# Patient Record
Sex: Female | Born: 1975 | Race: White | Hispanic: No | Marital: Married | Smoking: Never smoker
Health system: Southern US, Community
[De-identification: ages and names within clinical notes are randomized; demographics above are authoritative.]

## PROBLEM LIST (undated history)

## (undated) DIAGNOSIS — J329 Chronic sinusitis, unspecified: Secondary | ICD-10-CM

## (undated) DIAGNOSIS — K861 Other chronic pancreatitis: Secondary | ICD-10-CM

## (undated) DIAGNOSIS — K859 Acute pancreatitis without necrosis or infection, unspecified: Secondary | ICD-10-CM

## (undated) DIAGNOSIS — E785 Hyperlipidemia, unspecified: Secondary | ICD-10-CM

## (undated) DIAGNOSIS — M5030 Other cervical disc degeneration, unspecified cervical region: Secondary | ICD-10-CM

## (undated) DIAGNOSIS — M797 Fibromyalgia: Secondary | ICD-10-CM

## (undated) DIAGNOSIS — K589 Irritable bowel syndrome without diarrhea: Secondary | ICD-10-CM

## (undated) DIAGNOSIS — M5136 Other intervertebral disc degeneration, lumbar region: Secondary | ICD-10-CM

## (undated) DIAGNOSIS — M51369 Other intervertebral disc degeneration, lumbar region without mention of lumbar back pain or lower extremity pain: Secondary | ICD-10-CM

## (undated) DIAGNOSIS — M170 Bilateral primary osteoarthritis of knee: Secondary | ICD-10-CM

## (undated) DIAGNOSIS — M3500 Sicca syndrome, unspecified: Secondary | ICD-10-CM

## (undated) DIAGNOSIS — M6289 Other specified disorders of muscle: Secondary | ICD-10-CM

## (undated) DIAGNOSIS — M719 Bursopathy, unspecified: Secondary | ICD-10-CM

## (undated) DIAGNOSIS — G43909 Migraine, unspecified, not intractable, without status migrainosus: Secondary | ICD-10-CM

## (undated) DIAGNOSIS — G43019 Migraine without aura, intractable, without status migrainosus: Secondary | ICD-10-CM

## (undated) DIAGNOSIS — N94810 Vulvar vestibulitis: Secondary | ICD-10-CM

## (undated) HISTORY — DX: Sjogren syndrome, unspecified: M35.00

## (undated) HISTORY — DX: Chronic sinusitis, unspecified: J32.9

## (undated) HISTORY — DX: Other intervertebral disc degeneration, lumbar region: M51.36

## (undated) HISTORY — DX: Other cervical disc degeneration, unspecified cervical region: M50.30

## (undated) HISTORY — DX: Other intervertebral disc degeneration, lumbar region without mention of lumbar back pain or lower extremity pain: M51.369

## (undated) HISTORY — DX: Bursopathy, unspecified: M71.9

## (undated) HISTORY — DX: Bilateral primary osteoarthritis of knee: M17.0

## (undated) HISTORY — PX: OTHER SURGICAL HISTORY: SHX169

## (undated) HISTORY — DX: Migraine without aura, intractable, without status migrainosus: G43.019

## (undated) HISTORY — DX: Hyperlipidemia, unspecified: E78.5

## (undated) HISTORY — DX: Irritable bowel syndrome, unspecified: K58.9

## (undated) HISTORY — DX: Other specified disorders of muscle: M62.89

## (undated) HISTORY — PX: ERCP: SHX60

## (undated) HISTORY — DX: Acute pancreatitis without necrosis or infection, unspecified: K85.90

## (undated) NOTE — Progress Notes (Signed)
 Formatting of this note might be different from the original. History: Heather Wagner is a 11 year old female  [id number]  Patient's last menstrual period was 02/19/2010. CHIEF COMPLAINT: Patient presents for evaluation of Vestibulitis/Dysparunia  BP 118/73  Pulse 84  Ht 1.626 m (5' 4)  Wt 66.679 kg (147 lb)  LMP 02/19/2010 Exam: BUSV: VESTIBULE TENDER TO Q TIP TOUCH AT 3:00-6:00-9:00 CERVIX: NORMAL  UTERUS: NORMAL SIZE AND SHAPE  ADENEXA: NO PALPABLE MASS NOR TENDERNESS  ASSESSMENT: VESTIBULITIS  PLAN:: VESTIBULE INJECTED WITH 1 ml PLAIN LIDOCAINE AND 1 ml   KENALOG AT THE 3:00-6:00-9:00 O'CLOCK POSITION WITH RESOLUTION OF THE VESTIBULAR PAIN AND TENDERNESS. VESTIBULITIS HAND OUT FOLLOW UP TO SEE ME FOR FURTHER TREATMENT electronically signed by GARNETTE LAMAR CAGE MD 04/06/2010 4:08 PM        Electronically signed by CAGE GARNETTE LAMAR (M.D.) at 04/06/2010  4:08 PM PDT

---

## 1995-12-04 HISTORY — PX: PANCREATICODUODENECTOMY: SUR1000

## 1995-12-04 HISTORY — PX: OTHER SURGICAL HISTORY: SHX169

## 1995-12-04 HISTORY — PX: PANCREAS SURGERY: SHX731

## 2005-12-03 HISTORY — PX: DILATION AND CURETTAGE OF UTERUS: SHX78

## 2008-12-03 HISTORY — PX: BREAST REDUCTION SURGERY: SHX8

## 2010-06-29 ENCOUNTER — Inpatient Hospital Stay (HOSPITAL_COMMUNITY): Admission: EM | Admit: 2010-06-29 | Discharge: 2010-07-02 | Payer: Self-pay | Admitting: Emergency Medicine

## 2010-06-30 ENCOUNTER — Ambulatory Visit: Payer: Self-pay | Admitting: Vascular Surgery

## 2010-07-29 ENCOUNTER — Emergency Department (HOSPITAL_COMMUNITY): Admission: EM | Admit: 2010-07-29 | Discharge: 2010-07-29 | Payer: Self-pay | Admitting: Emergency Medicine

## 2010-08-31 ENCOUNTER — Emergency Department (HOSPITAL_COMMUNITY): Admission: EM | Admit: 2010-08-31 | Discharge: 2010-08-31 | Payer: Self-pay | Admitting: Emergency Medicine

## 2010-09-19 ENCOUNTER — Emergency Department (HOSPITAL_COMMUNITY): Admission: EM | Admit: 2010-09-19 | Discharge: 2010-09-20 | Payer: Self-pay | Admitting: Emergency Medicine

## 2010-11-25 ENCOUNTER — Emergency Department (HOSPITAL_COMMUNITY)
Admission: EM | Admit: 2010-11-25 | Discharge: 2010-11-25 | Payer: Self-pay | Source: Home / Self Care | Admitting: Emergency Medicine

## 2010-11-27 ENCOUNTER — Emergency Department (HOSPITAL_COMMUNITY)
Admission: EM | Admit: 2010-11-27 | Discharge: 2010-11-27 | Payer: Self-pay | Source: Home / Self Care | Admitting: Emergency Medicine

## 2010-12-15 ENCOUNTER — Ambulatory Visit: Admit: 2010-12-15 | Payer: Self-pay | Admitting: Obstetrics & Gynecology

## 2011-02-12 LAB — CBC
HCT: 35.9 % — ABNORMAL LOW (ref 36.0–46.0)
MCHC: 34 g/dL (ref 30.0–36.0)
RBC: 4.16 MIL/uL (ref 3.87–5.11)
RDW: 12 % (ref 11.5–15.5)

## 2011-02-12 LAB — URINALYSIS, ROUTINE W REFLEX MICROSCOPIC
Bilirubin Urine: NEGATIVE
Glucose, UA: NEGATIVE mg/dL
Glucose, UA: NEGATIVE mg/dL
Hgb urine dipstick: NEGATIVE
Ketones, ur: NEGATIVE mg/dL
Nitrite: NEGATIVE
Specific Gravity, Urine: 1.015 (ref 1.005–1.030)
pH: 6 (ref 5.0–8.0)
pH: 6 (ref 5.0–8.0)

## 2011-02-12 LAB — COMPREHENSIVE METABOLIC PANEL
AST: 27 U/L (ref 0–37)
Albumin: 3.6 g/dL (ref 3.5–5.2)
Albumin: 3.8 g/dL (ref 3.5–5.2)
Alkaline Phosphatase: 75 U/L (ref 39–117)
BUN: 8 mg/dL (ref 6–23)
BUN: 9 mg/dL (ref 6–23)
CO2: 26 mEq/L (ref 19–32)
CO2: 26 mEq/L (ref 19–32)
Calcium: 9 mg/dL (ref 8.4–10.5)
Creatinine, Ser: 0.56 mg/dL (ref 0.4–1.2)
GFR calc Af Amer: >60 mL/min (ref >60)
GFR calc Af Amer: >60 mL/min (ref >60)
GFR calc non Af Amer: >60 mL/min (ref >60)
GFR calc non Af Amer: >60 mL/min (ref >60)
Glucose, Bld: 194 mg/dL — ABNORMAL HIGH (ref 70–99)
Potassium: 4 mEq/L (ref 3.5–5.1)
Potassium: 4.2 mEq/L (ref 3.5–5.1)
Sodium: 137 mEq/L (ref 135–145)
Total Bilirubin: 0.5 mg/dL (ref 0.3–1.2)
Total Bilirubin: 0.6 mg/dL (ref 0.3–1.2)
Total Protein: 7.1 g/dL (ref 6.0–8.3)

## 2011-02-12 LAB — DIFFERENTIAL
Basophils Relative: 0 % (ref 0–1)
Monocytes Relative: 6 % (ref 3–12)
Neutrophils Relative %: 75 % (ref 43–77)

## 2011-02-12 LAB — LIPASE, BLOOD: Lipase: 15 U/L (ref 11–59)

## 2011-02-14 LAB — COMPREHENSIVE METABOLIC PANEL
ALT: 35 U/L (ref 0–35)
Alkaline Phosphatase: 86 U/L (ref 39–117)
BUN: 9 mg/dL (ref 6–23)
CO2: 26 mEq/L (ref 19–32)
Calcium: 9.2 mg/dL (ref 8.4–10.5)
Chloride: 104 mEq/L (ref 96–112)
Creatinine, Ser: 0.47 mg/dL (ref 0.4–1.2)
GFR calc non Af Amer: >60 mL/min (ref >60)
Total Bilirubin: 0.9 mg/dL (ref 0.3–1.2)
Total Protein: 7.4 g/dL (ref 6.0–8.3)

## 2011-02-14 LAB — CBC
Hemoglobin: 12.9 g/dL (ref 12.0–15.0)
MCH: 29.4 pg (ref 26.0–34.0)
MCV: 86.5 fL (ref 78.0–100.0)
Platelets: 402 10*3/uL — ABNORMAL HIGH (ref 150–400)

## 2011-02-14 LAB — LIPASE, BLOOD: Lipase: 19 U/L (ref 11–59)

## 2011-02-14 LAB — URINALYSIS, ROUTINE W REFLEX MICROSCOPIC
Hgb urine dipstick: NEGATIVE
Ketones, ur: 15 mg/dL — AB
Leukocytes, UA: NEGATIVE
Protein, ur: NEGATIVE mg/dL
pH: 5.5 (ref 5.0–8.0)

## 2011-02-14 LAB — POCT I-STAT, CHEM 8
Calcium, Ion: 1.16 mmol/L (ref 1.12–1.32)
Glucose, Bld: 181 mg/dL — ABNORMAL HIGH (ref 70–99)
HCT: 41 % (ref 36.0–46.0)
Hemoglobin: 13.9 g/dL (ref 12.0–15.0)
Potassium: 3.8 mEq/L (ref 3.5–5.1)
Sodium: 137 mEq/L (ref 135–145)

## 2011-02-14 LAB — URINE MICROSCOPIC-ADD ON

## 2011-02-14 LAB — DIFFERENTIAL
Eosinophils Relative: 1 % (ref 0–5)
Lymphs Abs: 2.4 10*3/uL (ref 0.7–4.0)
Neutro Abs: 10.2 10*3/uL — ABNORMAL HIGH (ref 1.7–7.7)
Neutrophils Relative %: 76 % (ref 43–77)

## 2011-02-14 LAB — GLUCOSE, CAPILLARY

## 2011-02-15 LAB — URINALYSIS, ROUTINE W REFLEX MICROSCOPIC
Bilirubin Urine: NEGATIVE
Glucose, UA: NEGATIVE mg/dL
Hgb urine dipstick: NEGATIVE
Ketones, ur: >80 mg/dL — AB
Nitrite: NEGATIVE
Protein, ur: NEGATIVE mg/dL
Urobilinogen, UA: 0.2 mg/dL (ref 0.0–1.0)
pH: 5.5 (ref 5.0–8.0)

## 2011-02-15 LAB — GLUCOSE, CAPILLARY: Glucose-Capillary: 216 mg/dL — ABNORMAL HIGH (ref 70–99)

## 2011-02-15 LAB — CBC
HCT: 35.9 % — ABNORMAL LOW (ref 36.0–46.0)
MCH: 29.8 pg (ref 26.0–34.0)
MCHC: 34.1 g/dL (ref 30.0–36.0)
RDW: 12.6 % (ref 11.5–15.5)
WBC: 11.6 10*3/uL — ABNORMAL HIGH (ref 4.0–10.5)

## 2011-02-15 LAB — COMPREHENSIVE METABOLIC PANEL
ALT: 29 U/L (ref 0–35)
AST: 29 U/L (ref 0–37)
CO2: 26 mEq/L (ref 19–32)
Calcium: 8.9 mg/dL (ref 8.4–10.5)
GFR calc Af Amer: >60 mL/min (ref >60)
GFR calc non Af Amer: >60 mL/min (ref >60)
Sodium: 136 mEq/L (ref 135–145)
Total Protein: 7.5 g/dL (ref 6.0–8.3)

## 2011-02-15 LAB — DIFFERENTIAL
Eosinophils Absolute: 0.1 10*3/uL (ref 0.0–0.7)
Eosinophils Relative: 1 % (ref 0–5)
Lymphs Abs: 2 10*3/uL (ref 0.7–4.0)
Monocytes Relative: 5 % (ref 3–12)

## 2011-02-16 ENCOUNTER — Encounter: Payer: Self-pay | Admitting: Family Medicine

## 2011-02-16 LAB — COMPREHENSIVE METABOLIC PANEL
ALT: 39 U/L — ABNORMAL HIGH (ref 0–35)
CO2: 23 mEq/L (ref 19–32)
Calcium: 8.4 mg/dL (ref 8.4–10.5)
Creatinine, Ser: 0.44 mg/dL (ref 0.4–1.2)
GFR calc non Af Amer: >60 mL/min (ref >60)
Glucose, Bld: 176 mg/dL — ABNORMAL HIGH (ref 70–99)

## 2011-02-16 LAB — DIFFERENTIAL
Basophils Absolute: 0 10*3/uL (ref 0.0–0.1)
Eosinophils Absolute: 0.3 10*3/uL (ref 0.0–0.7)
Lymphs Abs: 1.7 10*3/uL (ref 0.7–4.0)
Neutrophils Relative %: 69 % (ref 43–77)

## 2011-02-16 LAB — URINALYSIS, ROUTINE W REFLEX MICROSCOPIC
Bilirubin Urine: NEGATIVE
Glucose, UA: 250 mg/dL — AB
Ketones, ur: 15 mg/dL — AB
pH: 7 (ref 5.0–8.0)

## 2011-02-16 LAB — CBC
HCT: 35.6 % — ABNORMAL LOW (ref 36.0–46.0)
Hemoglobin: 12.2 g/dL (ref 12.0–15.0)
MCH: 30 pg (ref 26.0–34.0)
MCHC: 34.3 g/dL (ref 30.0–36.0)

## 2011-02-16 LAB — LIPASE, BLOOD: Lipase: 17 U/L (ref 11–59)

## 2011-02-16 LAB — POCT PREGNANCY, URINE: Preg Test, Ur: NEGATIVE

## 2011-02-17 LAB — CBC
HCT: 32 % — ABNORMAL LOW (ref 36.0–46.0)
Hemoglobin: 10.7 g/dL — ABNORMAL LOW (ref 12.0–15.0)
Hemoglobin: 11.1 g/dL — ABNORMAL LOW (ref 12.0–15.0)
MCH: 30.2 pg (ref 26.0–34.0)
MCH: 30.4 pg (ref 26.0–34.0)
MCHC: 34.2 g/dL (ref 30.0–36.0)
MCHC: 34.5 g/dL (ref 30.0–36.0)
MCV: 87.7 fL (ref 78.0–100.0)
MCV: 87.9 fL (ref 78.0–100.0)
Platelets: 217 10*3/uL (ref 150–400)
Platelets: 263 10*3/uL (ref 150–400)
RBC: 3.66 MIL/uL — ABNORMAL LOW (ref 3.87–5.11)
RDW: 11.7 % (ref 11.5–15.5)
RDW: 12.1 % (ref 11.5–15.5)
WBC: 13.8 10*3/uL — ABNORMAL HIGH (ref 4.0–10.5)
WBC: 9.3 10*3/uL (ref 4.0–10.5)

## 2011-02-17 LAB — GLUCOSE, CAPILLARY
Glucose-Capillary: 119 mg/dL — ABNORMAL HIGH (ref 70–99)
Glucose-Capillary: 127 mg/dL — ABNORMAL HIGH (ref 70–99)
Glucose-Capillary: 129 mg/dL — ABNORMAL HIGH (ref 70–99)
Glucose-Capillary: 133 mg/dL — ABNORMAL HIGH (ref 70–99)
Glucose-Capillary: 136 mg/dL — ABNORMAL HIGH (ref 70–99)
Glucose-Capillary: 137 mg/dL — ABNORMAL HIGH (ref 70–99)
Glucose-Capillary: 140 mg/dL — ABNORMAL HIGH (ref 70–99)
Glucose-Capillary: 145 mg/dL — ABNORMAL HIGH (ref 70–99)
Glucose-Capillary: 200 mg/dL — ABNORMAL HIGH (ref 70–99)
Glucose-Capillary: 213 mg/dL — ABNORMAL HIGH (ref 70–99)
Glucose-Capillary: 235 mg/dL — ABNORMAL HIGH (ref 70–99)

## 2011-02-17 LAB — URINE CULTURE: Special Requests: NEGATIVE

## 2011-02-17 LAB — COMPREHENSIVE METABOLIC PANEL
ALT: 31 U/L (ref 0–35)
AST: 22 U/L (ref 0–37)
Albumin: 3.5 g/dL (ref 3.5–5.2)
BUN: 13 mg/dL (ref 6–23)
CO2: 22 mEq/L (ref 19–32)
Calcium: 7.8 mg/dL — ABNORMAL LOW (ref 8.4–10.5)
Calcium: 8.8 mg/dL (ref 8.4–10.5)
Chloride: 103 mEq/L (ref 96–112)
Creatinine, Ser: 0.55 mg/dL (ref 0.4–1.2)
Creatinine, Ser: 0.6 mg/dL (ref 0.4–1.2)
GFR calc non Af Amer: >60 mL/min (ref >60)
Glucose, Bld: 143 mg/dL — ABNORMAL HIGH (ref 70–99)
Potassium: 3.8 mEq/L (ref 3.5–5.1)
Sodium: 134 mEq/L — ABNORMAL LOW (ref 135–145)
Total Bilirubin: 0.9 mg/dL (ref 0.3–1.2)
Total Protein: 7.2 g/dL (ref 6.0–8.3)

## 2011-02-17 LAB — PREGNANCY, URINE: Preg Test, Ur: NEGATIVE

## 2011-02-17 LAB — DIFFERENTIAL
Lymphocytes Relative: 8 % — ABNORMAL LOW (ref 12–46)
Lymphs Abs: 1.2 10*3/uL (ref 0.7–4.0)
Monocytes Absolute: 0.9 10*3/uL (ref 0.1–1.0)
Monocytes Relative: 7 % (ref 3–12)
Neutro Abs: 11.6 10*3/uL — ABNORMAL HIGH (ref 1.7–7.7)
Neutrophils Relative %: 84 % — ABNORMAL HIGH (ref 43–77)

## 2011-02-17 LAB — BASIC METABOLIC PANEL
BUN: 6 mg/dL (ref 6–23)
CO2: 25 mEq/L (ref 19–32)
Chloride: 104 mEq/L (ref 96–112)
Glucose, Bld: 118 mg/dL — ABNORMAL HIGH (ref 70–99)
Potassium: 3.9 mEq/L (ref 3.5–5.1)
Sodium: 136 mEq/L (ref 135–145)

## 2011-02-17 LAB — URINALYSIS, ROUTINE W REFLEX MICROSCOPIC
Glucose, UA: NEGATIVE mg/dL
Nitrite: NEGATIVE
pH: 5 (ref 5.0–8.0)

## 2011-02-17 LAB — LIPID PANEL
Cholesterol: 138 mg/dL (ref 0–200)
HDL: 56 mg/dL (ref >39)
LDL Cholesterol: 61 mg/dL (ref 0–99)
Total CHOL/HDL Ratio: 2.5 RATIO

## 2011-02-17 LAB — CULTURE, BLOOD (ROUTINE X 2)

## 2011-02-17 LAB — RAPID URINE DRUG SCREEN, HOSP PERFORMED
Barbiturates: NOT DETECTED
Benzodiazepines: NOT DETECTED
Cocaine: NOT DETECTED

## 2011-02-17 LAB — LIPASE, BLOOD: Lipase: 17 U/L (ref 11–59)

## 2011-02-17 LAB — HEMOGLOBIN A1C: Mean Plasma Glucose: 235 mg/dL — ABNORMAL HIGH (ref <117)

## 2011-03-12 ENCOUNTER — Encounter: Payer: Self-pay | Admitting: Family Medicine

## 2011-05-24 ENCOUNTER — Encounter: Payer: Self-pay | Admitting: Physician Assistant

## 2011-12-31 ENCOUNTER — Other Ambulatory Visit (HOSPITAL_COMMUNITY)
Admission: RE | Admit: 2011-12-31 | Discharge: 2011-12-31 | Disposition: A | Discharge status: Home / Self Care | Payer: BC Managed Care – PPO | Source: Ambulatory Visit | Attending: Obstetrics and Gynecology | Admitting: Obstetrics and Gynecology

## 2011-12-31 DIAGNOSIS — Z01419 Encounter for gynecological examination (general) (routine) without abnormal findings: Secondary | ICD-10-CM | POA: Insufficient documentation

## 2012-03-25 ENCOUNTER — Encounter (HOSPITAL_COMMUNITY): Payer: Self-pay | Admitting: Emergency Medicine

## 2012-03-25 ENCOUNTER — Emergency Department (HOSPITAL_COMMUNITY)
Admission: EM | Admit: 2012-03-25 | Discharge: 2012-03-26 | Disposition: A | Discharge status: Home / Self Care | Payer: BC Managed Care – PPO | Attending: Emergency Medicine | Admitting: Emergency Medicine

## 2012-03-25 DIAGNOSIS — H53149 Visual discomfort, unspecified: Secondary | ICD-10-CM | POA: Insufficient documentation

## 2012-03-25 DIAGNOSIS — R197 Diarrhea, unspecified: Secondary | ICD-10-CM | POA: Insufficient documentation

## 2012-03-25 DIAGNOSIS — Z79899 Other long term (current) drug therapy: Secondary | ICD-10-CM | POA: Insufficient documentation

## 2012-03-25 DIAGNOSIS — E109 Type 1 diabetes mellitus without complications: Secondary | ICD-10-CM | POA: Insufficient documentation

## 2012-03-25 DIAGNOSIS — J45909 Unspecified asthma, uncomplicated: Secondary | ICD-10-CM | POA: Insufficient documentation

## 2012-03-25 DIAGNOSIS — R519 Headache, unspecified: Secondary | ICD-10-CM

## 2012-03-25 DIAGNOSIS — R51 Headache: Secondary | ICD-10-CM | POA: Insufficient documentation

## 2012-03-25 DIAGNOSIS — K861 Other chronic pancreatitis: Secondary | ICD-10-CM | POA: Insufficient documentation

## 2012-03-25 DIAGNOSIS — Z794 Long term (current) use of insulin: Secondary | ICD-10-CM | POA: Insufficient documentation

## 2012-03-25 DIAGNOSIS — IMO0001 Reserved for inherently not codable concepts without codable children: Secondary | ICD-10-CM | POA: Insufficient documentation

## 2012-03-25 DIAGNOSIS — R112 Nausea with vomiting, unspecified: Secondary | ICD-10-CM | POA: Insufficient documentation

## 2012-03-25 HISTORY — DX: Fibromyalgia: M79.7

## 2012-03-25 HISTORY — DX: Other chronic pancreatitis: K86.1

## 2012-03-25 HISTORY — DX: Migraine, unspecified, not intractable, without status migrainosus: G43.909

## 2012-03-25 LAB — GLUCOSE, CAPILLARY

## 2012-03-25 MED ORDER — METHYLPREDNISOLONE SODIUM SUCC 125 MG IJ SOLR
125.0000 mg | Freq: Once | INTRAMUSCULAR | Status: AC
  Administered 2012-03-26: 125 mg via INTRAVENOUS
  Filled 2012-03-25: qty 2

## 2012-03-25 MED ORDER — KETOROLAC TROMETHAMINE 30 MG/ML IJ SOLN
30.0000 mg | Freq: Once | INTRAMUSCULAR | Status: AC
  Administered 2012-03-26: 30 mg via INTRAVENOUS
  Filled 2012-03-25: qty 1

## 2012-03-25 MED ORDER — DROPERIDOL 2.5 MG/ML IJ SOLN
1.2500 mg | Freq: Once | INTRAMUSCULAR | Status: AC
  Administered 2012-03-26: 5 mg via INTRAVENOUS
  Filled 2012-03-25: qty 0.5

## 2012-03-25 MED ORDER — SODIUM CHLORIDE 0.9 % IV SOLN
INTRAVENOUS | Status: DC
  Administered 2012-03-26: via INTRAVENOUS

## 2012-03-25 MED ORDER — SODIUM CHLORIDE 0.9 % IV BOLUS (SEPSIS): 1000.0000 mL | Freq: Once | INTRAVENOUS | Status: DC

## 2012-03-25 MED ORDER — DIPHENHYDRAMINE HCL 50 MG/ML IJ SOLN
25.0000 mg | Freq: Once | INTRAMUSCULAR | Status: AC
  Administered 2012-03-26: 25 mg via INTRAVENOUS
  Filled 2012-03-25: qty 1

## 2012-03-25 NOTE — ED Notes (Signed)
Pt staes she has been having headaches for the past 3 weeks and had vomiting last Thursday  Pt states she saw her dr on April 12th and again today for migraine  Pt states she has had diarrhea and vomiting today  Pt was given 40mg  of steroids in a shot and to start prednisone tomorrow  Pt states she was given demerol 25mg  IM  Pt states she received no relief from the medications and continues to have vomiting and headache

## 2012-03-26 MED ORDER — DIPHENHYDRAMINE HCL 50 MG/ML IJ SOLN
INTRAMUSCULAR | Status: AC
  Administered 2012-03-26: 25 mg via INTRAVASCULAR
  Filled 2012-03-26: qty 1

## 2012-03-26 NOTE — ED Notes (Signed)
Pt began to feel very ancy and jittery she stated and felt like she needed something to calm her down.  Went to Dr. Lynelle Doctor and she ordered 25 of Benadryl which I then gave and pt got better

## 2012-03-26 NOTE — ED Notes (Signed)
Pt feeling better she states

## 2012-03-26 NOTE — ED Provider Notes (Signed)
History     CSN: [id number]  Arrival date & time 03/25/12  2149   First MD Initiated Contact with Patient 03/25/12 2300      Chief Complaint  Patient presents with  . Blood Sugar Problem  . Emesis  . Migraine    (Consider location/radiation/quality/duration/timing/severity/associated sxs/prior treatment) HPI  Patient relates she's been having chronic headaches for the past 3 weeks. She relates she was seen by her family physician in March, April 12 and then again today. Today she got a shot of steroid and they're going to start her on prednisone tomorrow. She states the headache she has today was there when she woke up about noon. She's had nausea with vomiting 3-4 times today and about 6 episodes of watery diarrhea. She states she's had chronic headaches for years that she relates to her menstrual cycle into sinus problems. She states her headaches have been on her left face and including earlier today but now her pain is on the right side. She states the headache is constant without throbbing component. She states it's tight and sharp. Character. She has mild photophobia and some noise sensitivity. She states she had some blurred vision last night but not today. She denies any fever. She states she normally takes Maxalt however she did not take it today. She's also concerned her blood sugar may be high. 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  PCP Dr. Clent Ridges with [facility]  Past Medical History  Diagnosis Date  . Pancreatitis chronic   . Asthma   . Diabetes mellitus   . Fibromyalgia   . Migraines     Past Surgical History  Procedure Date  . Whiple   . Breast reduction surgery   . Dilation and curettage of uterus     Family History  Problem Relation Age of Onset  . Diabetes Other   . Hypertension Other   . Cancer Other     History  Substance Use Topics  . Smoking  status: Never Smoker   . Smokeless tobacco: Not on file  . Alcohol Use: No  on disability for pancreatitis and chronic migraines Lives with husband Retired Chartered loss adjuster  OB History    Grav Para Term Preterm Abortions TAB SAB Ect Mult Living                  Review of Systems  All other systems reviewed and are negative.    Allergies  Nortriptyline hcl; Amitriptyline; Amoxicillin; Compazine; Cymbalta; Doxycycline; Flagyl; Penicillins cross reactors; Topamax; Prozac; and Reglan  Home Medications   Current Outpatient Rx  Name Route Sig Dispense Refill  . ALBUTEROL SULFATE HFA 108 (90 BASE) MCG/ACT IN AERS Inhalation Inhale 2 puffs into the lungs every 6 (six) hours as needed. Asthma    . BECLOMETHASONE DIPROPIONATE 80 MCG/ACT IN AERS Inhalation Inhale 1 puff into the lungs 2 (two) times daily.    Marland Kitchen CALCIUM CARBONATE ANTACID 750 MG PO CHEW Oral Chew 2-4 tablets by mouth as needed. No more than 10 a day    . CARBOXYMETHYLCELLULOSE SODIUM 1 % OP SOLN  1-2 drops every 2 (two) hours as needed. Dry eyes    . VITAMIN D 1000 UNITS PO TABS Oral Take 5,000 Units by mouth 2 (two) times daily.    Marland Kitchen ESTROGENS, CONJUGATED 0.625 MG/GM VA CREA Vaginal Place vaginally at bedtime as needed. Vestibulitis vaginismus    . HYDROCODONE-ACETAMINOPHEN 5-500 MG PO  TABS Oral Take 1 tablet by mouth every 6 (six) hours as needed. Pain    . IBUPROFEN 200 MG PO TABS Oral Take 400 mg by mouth every 6 (six) hours as needed. pain    . INSULIN ASPART 100 UNIT/ML [place] SOLN Subcutaneous Inject into the skin 3 (three) times daily before meals. 1 unit per 10 carb for levels over 150    . INSULIN GLARGINE 100 UNIT/ML [place] SOLN Subcutaneous Inject 13 Units into the skin 2 (two) times daily.    Marland Kitchen KETOTIFEN FUMARATE 0.025 % OP SOLN Both Eyes Place 1 drop into both eyes daily. As symptoms persist    . LIDOCAINE HCL 2 % EX GEL Topical Apply 1 application topically as needed. Outer vestibule and vaginally    . OMEPRAZOLE 20 MG PO  CPDR Oral Take 20 mg by mouth daily.    Marland Kitchen OVER THE COUNTER MEDICATION  See admin instructions. Calcium Magnesium complex 1000mg -500mg  2 tablets with a meal    . ZENPEP PO Oral Take 2 capsules by mouth 3 (three) times daily. [id number]    . PROMETHAZINE HCL 25 MG RE SUPP Rectal Place 25 mg rectally every 6 (six) hours as needed. Nausea when not tolerating by mouth    . PROMETHAZINE HCL 25 MG PO TABS Oral Take 25 mg by mouth every 6 (six) hours as needed. Nausea    . RIZATRIPTAN BENZOATE 10 MG PO TABS Oral Take 10 mg by mouth as needed. May repeat in 2 hours if needed for headaches    . SACCHAROMYCES BOULARDII 250 MG PO CAPS Oral Take 250 mg by mouth 2 (two) times daily between meals.    Marland Kitchen SALINE NASAL SPRAY 0.65 % NA SOLN Nasal Place 1 spray into the nose as needed. Sinus pain    . VITAMIN B-1 100 MG PO TABS Oral Take 100 mg by mouth daily after breakfast.    . VITAMINS A & D EX OINT Topical Apply 1 application topically as needed. Vulvar discomfort and irritation      BP 141/88  Pulse 103  Temp(Src) 98.8 F (37.1 C) (Oral)  Resp 16  SpO2 99%  LMP 03/16/2012  Vital signs normal except tachycardia   Physical Exam  Nursing note and vitals reviewed. Constitutional: She is oriented to person, place, and time. She appears well-developed and well-nourished.  Non-toxic appearance. She does not appear ill. No distress.  HENT:  Head: Normocephalic and atraumatic.  Right Ear: External ear normal.  Left Ear: External ear normal.  Nose: Nose normal. No mucosal edema or rhinorrhea.  Mouth/Throat: Oropharynx is clear and moist and mucous membranes are normal. No dental abscesses or uvula swelling.       Tender over her maxillary sinuses bilaterally. She has no redness or swelling of her face.  Eyes: Conjunctivae and EOM are normal. Pupils are equal, round, and reactive to light.  Neck: Normal range of motion and full passive range of motion without pain. Neck supple.  Cardiovascular:  Normal rate, regular rhythm and normal heart sounds.  Exam reveals no gallop and no friction rub.   No murmur heard. Pulmonary/Chest: Effort normal and breath sounds normal. No respiratory distress. She has no wheezes. She has no rhonchi. She has no rales. She exhibits no tenderness and no crepitus.  Abdominal: Normal appearance.  Musculoskeletal: Normal range of motion. She exhibits no edema and no tenderness.       Moves all extremities well.   Neurological: She is alert and oriented to  person, place, and time. She has normal strength. No cranial nerve deficit.  Skin: Skin is warm, dry and intact. No rash noted. No erythema. No pallor.  Psychiatric: She has a normal mood and affect. Her speech is normal and behavior is normal. Her mood appears not anxious.    ED Course  Procedures (including critical care time)   Medications  sodium chloride 0.9 % bolus 1,000 mL (not administered)  0.9 %  sodium chloride infusion (  Intravenous New Bag/Given 03/26/12 0006)  ketorolac (TORADOL) 30 MG/ML injection 30 mg (30 mg Intravenous Given 03/26/12 0006)  diphenhydrAMINE (BENADRYL) injection 25 mg (25 mg Intravenous Given 03/26/12 0007)  methylPREDNISolone sodium succinate (SOLU-MEDROL) 125 mg/2 mL injection 125 mg (125 mg Intravenous Given 03/26/12 0007)  droperidol (INAPSINE) injection 1.25 mg (5 mg Intravenous Given 03/26/12 0039)  diphenhydrAMINE (BENADRYL) 50 MG/ML injection (25 mg Intravascular Given 03/26/12 0056)    Per [facility] controlled substance site patient gets #120 hydrocodone 5/500 a month from Dr. Clent Ridges, the last was April 9.  Pt had reaction with feeling she was outside of herself after the droperidol that was treated with benadryl.  Recheck 02:00 states her headache is almost gone. Still feels funny after droperidol but is ready to go home.   Labs Reviewed  GLUCOSE, CAPILLARY - Abnormal; Notable for the following:    Glucose-Capillary 159 (*)    All other components within  normal limits   Laboratory interpretation all normal except mild hyperglycemia   Date: 03/26/2012  Rate: 98  Rhythm: normal sinus rhythm  QRS Axis: normal  Intervals: normal  ST/T Wave abnormalities: normal  Conduction Disutrbances:none  Narrative Interpretation:   Old EKG Reviewed: unchanged from 06/30/2010    No results found.   1. Headache, acute     Plan discharge  Devoria Albe, MD, FACEP   MDM          Ward Givens, MD 03/26/12 [phone]

## 2012-03-26 NOTE — Discharge Instructions (Signed)
Go home and rest. Follow up with Dr Clent Ridges about your headaches. Return if you get worse.

## 2012-09-15 ENCOUNTER — Other Ambulatory Visit: Payer: Self-pay | Admitting: Gastroenterology

## 2012-09-15 DIAGNOSIS — K859 Acute pancreatitis without necrosis or infection, unspecified: Secondary | ICD-10-CM

## 2012-09-29 ENCOUNTER — Other Ambulatory Visit: Payer: BC Managed Care – PPO

## 2012-10-06 ENCOUNTER — Ambulatory Visit
Admission: RE | Admit: 2012-10-06 | Discharge: 2012-10-06 | Disposition: A | Discharge status: Home / Self Care | Payer: BC Managed Care – PPO | Source: Ambulatory Visit | Attending: Gastroenterology | Admitting: Gastroenterology

## 2012-10-06 DIAGNOSIS — K859 Acute pancreatitis without necrosis or infection, unspecified: Secondary | ICD-10-CM

## 2012-10-06 MED ORDER — IOHEXOL 300 MG/ML  SOLN: 100.0000 mL | Freq: Once | INTRAMUSCULAR | Status: AC | PRN

## 2013-02-24 ENCOUNTER — Other Ambulatory Visit (HOSPITAL_COMMUNITY)
Admission: RE | Admit: 2013-02-24 | Discharge: 2013-02-24 | Disposition: A | Discharge status: Home / Self Care | Payer: BC Managed Care – PPO | Source: Ambulatory Visit | Attending: Obstetrics and Gynecology | Admitting: Obstetrics and Gynecology

## 2013-02-24 ENCOUNTER — Other Ambulatory Visit: Payer: Self-pay | Admitting: Obstetrics and Gynecology

## 2013-02-24 DIAGNOSIS — Z01419 Encounter for gynecological examination (general) (routine) without abnormal findings: Secondary | ICD-10-CM | POA: Insufficient documentation

## 2013-02-24 DIAGNOSIS — Z1151 Encounter for screening for human papillomavirus (HPV): Secondary | ICD-10-CM | POA: Insufficient documentation

## 2013-09-25 ENCOUNTER — Emergency Department (HOSPITAL_COMMUNITY): Payer: BC Managed Care – PPO

## 2013-09-25 ENCOUNTER — Encounter (HOSPITAL_COMMUNITY): Payer: Self-pay | Admitting: Emergency Medicine

## 2013-09-25 ENCOUNTER — Inpatient Hospital Stay (HOSPITAL_COMMUNITY)
Admission: EM | Admit: 2013-09-25 | Discharge: 2013-09-27 | DRG: 439 | Disposition: A | Discharge status: Home / Self Care | Payer: BC Managed Care – PPO | Attending: Internal Medicine | Admitting: Internal Medicine

## 2013-09-25 DIAGNOSIS — K861 Other chronic pancreatitis: Secondary | ICD-10-CM | POA: Diagnosis present

## 2013-09-25 DIAGNOSIS — D638 Anemia in other chronic diseases classified elsewhere: Secondary | ICD-10-CM | POA: Diagnosis present

## 2013-09-25 DIAGNOSIS — K859 Acute pancreatitis without necrosis or infection, unspecified: Principal | ICD-10-CM | POA: Diagnosis present

## 2013-09-25 DIAGNOSIS — D649 Anemia, unspecified: Secondary | ICD-10-CM

## 2013-09-25 DIAGNOSIS — Z90411 Acquired partial absence of pancreas: Secondary | ICD-10-CM

## 2013-09-25 DIAGNOSIS — D72829 Elevated white blood cell count, unspecified: Secondary | ICD-10-CM

## 2013-09-25 DIAGNOSIS — E119 Type 2 diabetes mellitus without complications: Secondary | ICD-10-CM

## 2013-09-25 DIAGNOSIS — Z23 Encounter for immunization: Secondary | ICD-10-CM

## 2013-09-25 DIAGNOSIS — G43909 Migraine, unspecified, not intractable, without status migrainosus: Secondary | ICD-10-CM | POA: Diagnosis not present

## 2013-09-25 DIAGNOSIS — J45909 Unspecified asthma, uncomplicated: Secondary | ICD-10-CM

## 2013-09-25 DIAGNOSIS — E871 Hypo-osmolality and hyponatremia: Secondary | ICD-10-CM

## 2013-09-25 DIAGNOSIS — D509 Iron deficiency anemia, unspecified: Secondary | ICD-10-CM

## 2013-09-25 DIAGNOSIS — E236 Other disorders of pituitary gland: Secondary | ICD-10-CM | POA: Diagnosis present

## 2013-09-25 LAB — URINALYSIS, ROUTINE W REFLEX MICROSCOPIC
Bilirubin Urine: NEGATIVE
Hgb urine dipstick: NEGATIVE
Ketones, ur: NEGATIVE mg/dL
Nitrite: NEGATIVE
Specific Gravity, Urine: 1.04 — ABNORMAL HIGH (ref 1.005–1.030)
Urobilinogen, UA: 0.2 mg/dL (ref 0.0–1.0)
pH: 6.5 (ref 5.0–8.0)

## 2013-09-25 LAB — CBC WITH DIFFERENTIAL/PLATELET
Basophils Absolute: 0 10*3/uL (ref 0.0–0.1)
Basophils Relative: 0 % (ref 0–1)
Basophils Relative: 0 % (ref 0–1)
Eosinophils Absolute: 0 10*3/uL (ref 0.0–0.7)
Eosinophils Relative: 1 % (ref 0–5)
Eosinophils Relative: 0 % (ref 0–5)
HCT: 35 % — ABNORMAL LOW (ref 36.0–46.0)
Hemoglobin: 11.4 g/dL — ABNORMAL LOW (ref 12.0–15.0)
Lymphocytes Relative: 11 % — ABNORMAL LOW (ref 12–46)
Lymphocytes Relative: 8 % — ABNORMAL LOW (ref 12–46)
MCHC: 32.7 g/dL (ref 30.0–36.0)
MCV: 79 fL (ref 78.0–100.0)
Monocytes Relative: 8 % (ref 3–12)
Neutro Abs: 21.1 10*3/uL — ABNORMAL HIGH (ref 1.7–7.7)
Neutrophils Relative %: 80 % — ABNORMAL HIGH (ref 43–77)
Platelets: 356 10*3/uL (ref 150–400)
RBC: 4.43 MIL/uL (ref 3.87–5.11)
RDW: 15 % (ref 11.5–15.5)
WBC: 26.4 10*3/uL — ABNORMAL HIGH (ref 4.0–10.5)
WBC: 22 10*3/uL — ABNORMAL HIGH (ref 4.0–10.5)

## 2013-09-25 LAB — COMPREHENSIVE METABOLIC PANEL
ALT: 20 U/L (ref 0–35)
Alkaline Phosphatase: 89 U/L (ref 39–117)
BUN: 13 mg/dL (ref 6–23)
Chloride: 96 mEq/L (ref 96–112)
GFR calc Af Amer: >90 mL/min (ref >90)
Glucose, Bld: 165 mg/dL — ABNORMAL HIGH (ref 70–99)
Potassium: 3.6 mEq/L (ref 3.5–5.1)
Sodium: 132 mEq/L — ABNORMAL LOW (ref 135–145)
Total Bilirubin: 0.4 mg/dL (ref 0.3–1.2)
Total Protein: 7.8 g/dL (ref 6.0–8.3)

## 2013-09-25 LAB — HEPATIC FUNCTION PANEL
ALT: 22 U/L (ref 0–35)
Albumin: 3.1 g/dL — ABNORMAL LOW (ref 3.5–5.2)
Alkaline Phosphatase: 79 U/L (ref 39–117)
Bilirubin, Direct: 0.1 mg/dL (ref 0.0–0.3)
Indirect Bilirubin: 0.5 mg/dL (ref 0.3–0.9)

## 2013-09-25 LAB — LIPID PANEL
Cholesterol: 157 mg/dL (ref 0–200)
HDL: 60 mg/dL (ref >39)
LDL Cholesterol: 84 mg/dL (ref 0–99)
Total CHOL/HDL Ratio: 2.6 RATIO

## 2013-09-25 LAB — BASIC METABOLIC PANEL
BUN: 9 mg/dL (ref 6–23)
CO2: 21 mEq/L (ref 19–32)
Calcium: 8.7 mg/dL (ref 8.4–10.5)
Chloride: 97 mEq/L (ref 96–112)
Creatinine, Ser: 0.51 mg/dL (ref 0.50–1.10)
GFR calc Af Amer: >90 mL/min (ref >90)

## 2013-09-25 LAB — LIPASE, BLOOD: Lipase: 67 U/L — ABNORMAL HIGH (ref 11–59)

## 2013-09-25 LAB — LACTATE DEHYDROGENASE: LDH: 164 U/L (ref 94–250)

## 2013-09-25 LAB — GLUCOSE, CAPILLARY: Glucose-Capillary: 114 mg/dL — ABNORMAL HIGH (ref 70–99)

## 2013-09-25 LAB — C-REACTIVE PROTEIN: CRP: 2.1 mg/dL — ABNORMAL HIGH (ref <0.60)

## 2013-09-25 MED ORDER — SODIUM CHLORIDE 0.9 % IV SOLN: INTRAVENOUS | Status: DC

## 2013-09-25 MED ORDER — INSULIN ASPART 100 UNIT/ML ~~LOC~~ SOLN
0.0000 [IU] | Freq: Three times a day (TID) | SUBCUTANEOUS | Status: DC
  Administered 2013-09-25 – 2013-09-26 (×3): 2 [IU] via SUBCUTANEOUS
  Administered 2013-09-27: 1 [IU] via SUBCUTANEOUS

## 2013-09-25 MED ORDER — IOHEXOL 300 MG/ML  SOLN
100.0000 mL | Freq: Once | INTRAMUSCULAR | Status: AC | PRN
  Administered 2013-09-25: 100 mL via INTRAVENOUS

## 2013-09-25 MED ORDER — SODIUM CHLORIDE 0.9 % IV SOLN
500.0000 mg | Freq: Four times a day (QID) | INTRAVENOUS | Status: DC
  Administered 2013-09-25 (×2): 500 mg via INTRAVENOUS
  Filled 2013-09-25 (×6): qty 500

## 2013-09-25 MED ORDER — ONDANSETRON HCL 4 MG/2ML IJ SOLN
4.0000 mg | Freq: Four times a day (QID) | INTRAMUSCULAR | Status: DC | PRN
  Administered 2013-09-25 (×2): 4 mg via INTRAVENOUS
  Filled 2013-09-25 (×2): qty 2

## 2013-09-25 MED ORDER — ACETAMINOPHEN 650 MG RE SUPP: 650.0000 mg | Freq: Four times a day (QID) | RECTAL | Status: DC | PRN

## 2013-09-25 MED ORDER — SODIUM CHLORIDE 0.9 % IV SOLN
Freq: Once | INTRAVENOUS | Status: AC
  Administered 2013-09-25: 03:00:00 via INTRAVENOUS

## 2013-09-25 MED ORDER — HYDROMORPHONE HCL PF 1 MG/ML IJ SOLN
1.0000 mg | INTRAMUSCULAR | Status: DC | PRN
  Administered 2013-09-25 – 2013-09-26 (×4): 1 mg via INTRAVENOUS
  Administered 2013-09-26: 2 mg via INTRAVENOUS
  Administered 2013-09-26 (×2): 1 mg via INTRAVENOUS
  Administered 2013-09-26 (×2): 2 mg via INTRAVENOUS
  Administered 2013-09-27: 1 mg via INTRAVENOUS
  Filled 2013-09-25: qty 1
  Filled 2013-09-25: qty 2
  Filled 2013-09-25 (×2): qty 1
  Filled 2013-09-25 (×2): qty 2
  Filled 2013-09-25 (×2): qty 1
  Filled 2013-09-25 (×2): qty 2

## 2013-09-25 MED ORDER — SODIUM CHLORIDE 0.9 % IV SOLN
25.0000 mg | Freq: Four times a day (QID) | INTRAVENOUS | Status: DC | PRN
  Filled 2013-09-25: qty 1

## 2013-09-25 MED ORDER — ONDANSETRON HCL 4 MG PO TABS: 4.0000 mg | ORAL_TABLET | Freq: Four times a day (QID) | ORAL | Status: DC | PRN

## 2013-09-25 MED ORDER — PANTOPRAZOLE SODIUM 40 MG PO TBEC
40.0000 mg | DELAYED_RELEASE_TABLET | Freq: Every day | ORAL | Status: DC
  Administered 2013-09-25 – 2013-09-27 (×3): 40 mg via ORAL
  Filled 2013-09-25 (×3): qty 1

## 2013-09-25 MED ORDER — SODIUM CHLORIDE 0.9 % IV SOLN
Freq: Once | INTRAVENOUS | Status: AC
  Administered 2013-09-25: 06:00:00 via INTRAVENOUS

## 2013-09-25 MED ORDER — MOMETASONE FURO-FORMOTEROL FUM 100-5 MCG/ACT IN AERO
2.0000 | INHALATION_SPRAY | Freq: Two times a day (BID) | RESPIRATORY_TRACT | Status: DC
  Administered 2013-09-25 – 2013-09-27 (×4): 2 via RESPIRATORY_TRACT
  Filled 2013-09-25: qty 8.8

## 2013-09-25 MED ORDER — ACETAMINOPHEN 325 MG PO TABS
650.0000 mg | ORAL_TABLET | Freq: Four times a day (QID) | ORAL | Status: DC | PRN
  Administered 2013-09-26 – 2013-09-27 (×2): 650 mg via ORAL
  Filled 2013-09-25 (×2): qty 2

## 2013-09-25 MED ORDER — SODIUM CHLORIDE 0.9 % IV BOLUS (SEPSIS)
1000.0000 mL | Freq: Once | INTRAVENOUS | Status: AC
  Administered 2013-09-25: 1000 mL via INTRAVENOUS

## 2013-09-25 MED ORDER — ONDANSETRON HCL 4 MG/2ML IJ SOLN: 4.0000 mg | Freq: Three times a day (TID) | INTRAMUSCULAR | Status: DC | PRN

## 2013-09-25 MED ORDER — HYDROMORPHONE HCL PF 1 MG/ML IJ SOLN
1.0000 mg | INTRAMUSCULAR | Status: DC | PRN
  Administered 2013-09-25 (×3): 1 mg via INTRAVENOUS
  Filled 2013-09-25 (×3): qty 1

## 2013-09-25 MED ORDER — INFLUENZA VAC SPLIT QUAD 0.5 ML IM SUSP
0.5000 mL | INTRAMUSCULAR | Status: DC
Start: 2013-09-26 — End: 2013-09-27
  Filled 2013-09-25 (×2): qty 0.5

## 2013-09-25 MED ORDER — INSULIN GLARGINE 100 UNIT/ML ~~LOC~~ SOLN
5.0000 [IU] | Freq: Two times a day (BID) | SUBCUTANEOUS | Status: DC
  Administered 2013-09-25 (×2): 5 [IU] via SUBCUTANEOUS
  Filled 2013-09-25 (×3): qty 0.05

## 2013-09-25 MED ORDER — HYDROMORPHONE HCL PF 1 MG/ML IJ SOLN: 1.0000 mg | INTRAMUSCULAR | Status: DC | PRN

## 2013-09-25 MED ORDER — PROMETHAZINE HCL 25 MG/ML IJ SOLN
12.5000 mg | Freq: Four times a day (QID) | INTRAMUSCULAR | Status: DC | PRN
  Administered 2013-09-25 – 2013-09-27 (×6): 12.5 mg via INTRAVENOUS
  Filled 2013-09-25 (×6): qty 1

## 2013-09-25 MED ORDER — HYDROMORPHONE HCL PF 1 MG/ML IJ SOLN
1.0000 mg | INTRAMUSCULAR | Status: DC | PRN
  Administered 2013-09-25 (×2): 1 mg via INTRAVENOUS
  Filled 2013-09-25 (×2): qty 1

## 2013-09-25 MED ORDER — SODIUM CHLORIDE 0.9 % IV SOLN
INTRAVENOUS | Status: AC
  Administered 2013-09-25 – 2013-09-26 (×4): via INTRAVENOUS

## 2013-09-25 MED ORDER — CARBOXYMETHYLCELLULOSE SODIUM 1 % OP SOLN: 1.0000 [drp] | OPHTHALMIC | Status: DC | PRN

## 2013-09-25 MED ORDER — ALBUTEROL SULFATE HFA 108 (90 BASE) MCG/ACT IN AERS
2.0000 | INHALATION_SPRAY | Freq: Four times a day (QID) | RESPIRATORY_TRACT | Status: DC | PRN
  Filled 2013-09-25: qty 6.7

## 2013-09-25 MED ORDER — HYDROMORPHONE HCL PF 1 MG/ML IJ SOLN
1.0000 mg | Freq: Once | INTRAMUSCULAR | Status: AC
  Administered 2013-09-25: 1 mg via INTRAVENOUS
  Filled 2013-09-25: qty 1

## 2013-09-25 MED ORDER — BECLOMETHASONE DIPROPIONATE 80 MCG/ACT IN AERS: 1.0000 | INHALATION_SPRAY | RESPIRATORY_TRACT | Status: DC | PRN

## 2013-09-25 MED ORDER — IOHEXOL 300 MG/ML  SOLN
50.0000 mL | Freq: Once | INTRAMUSCULAR | Status: AC | PRN
  Administered 2013-09-25: 50 mL via ORAL

## 2013-09-25 MED ORDER — ALBUTEROL SULFATE HFA 108 (90 BASE) MCG/ACT IN AERS
2.0000 | INHALATION_SPRAY | RESPIRATORY_TRACT | Status: DC | PRN
  Administered 2013-09-25 – 2013-09-26 (×2): 2 via RESPIRATORY_TRACT
  Filled 2013-09-25: qty 6.7

## 2013-09-25 MED ORDER — POLYVINYL ALCOHOL 1.4 % OP SOLN
1.0000 [drp] | OPHTHALMIC | Status: DC | PRN
  Filled 2013-09-25: qty 15

## 2013-09-25 MED ORDER — MOMETASONE FURO-FORMOTEROL FUM 100-5 MCG/ACT IN AERO
2.0000 | INHALATION_SPRAY | Freq: Two times a day (BID) | RESPIRATORY_TRACT | Status: DC
Start: 2013-09-25 — End: 2013-09-25
  Filled 2013-09-25: qty 8.8

## 2013-09-25 MED ORDER — PROMETHAZINE HCL 25 MG/ML IJ SOLN
12.5000 mg | Freq: Once | INTRAMUSCULAR | Status: AC
  Administered 2013-09-25: 12.5 mg via INTRAVENOUS
  Filled 2013-09-25: qty 1

## 2013-09-25 NOTE — Progress Notes (Signed)
Pt rec'd from the ed...Marland KitchenMarland KitchenCondition stable..placed in bed. Pt without c/o of this time.

## 2013-09-25 NOTE — ED Provider Notes (Signed)
Medical screening examination/treatment/procedure(s) were performed by non-physician practitioner and as supervising physician I was immediately available for consultation/collaboration.  Derwood Kaplan, MD 09/25/13 [phone]

## 2013-09-25 NOTE — Progress Notes (Signed)
ANTIBIOTIC CONSULT NOTE - INITIAL  Pharmacy Consult for Primaxin Indication: Pancreatitis   Allergies  Allergen Reactions  . Nortriptyline Hcl Shortness Of Breath    Swelling anxiety  . Amitriptyline Other (See Comments)    sedation  . Amoxicillin Nausea And Vomiting  . Compazine [Prochlorperazine Maleate] Other (See Comments)    Neurological - uncontrollable eye movements  . Cymbalta [Duloxetine Hcl] Nausea And Vomiting    insomnia  . Doxycycline Nausea And Vomiting  . Flagyl [Metronidazole Hcl] Nausea And Vomiting    Skin rash  . Penicillins Cross Reactors Nausea And Vomiting    Skin rash/hives  . Topamax [Topiramate] Other (See Comments)    Mood change, anxiety, headaches  . Prozac [Fluoxetine Hcl] Anxiety  . Reglan [Metoclopramide Hcl] Anxiety    Patient Measurements: Height: 5\' 4"  (162.6 cm) Weight: 159 lb 9.6 oz (72.394 kg) IBW/kg (Calculated) : 54.7  Vital Signs: Temp: 99.2 F (37.3 C) (10/24 0649) Temp src: Oral (10/24 0649) BP: 126/72 mmHg (10/24 0649) Pulse Rate: 98 (10/24 0649) Intake/Output from previous day:   Intake/Output from this shift:    Labs:  Recent Labs  09/25/13 0035  WBC 26.4*  HGB 11.4*  PLT 413*  CREATININE 0.53   Estimated Creatinine Clearance: 93.9 ml/min (by C-G formula based on Cr of 0.53). No results found for this basename: VANCOTROUGH, VANCOPEAK, VANCORANDOM, GENTTROUGH, GENTPEAK, GENTRANDOM, TOBRATROUGH, TOBRAPEAK, TOBRARND, AMIKACINPEAK, AMIKACINTROU, AMIKACIN,  in the last 72 hours   Microbiology: No results found for this or any previous visit (from the past 720 hour(s)).  Medical History: Past Medical History  Diagnosis Date  . Pancreatitis chronic   . Asthma   . Diabetes mellitus   . Fibromyalgia   . Migraines    Assessment: 65 yoF with PMHx for chronic hereditary pancreatitis s/p whipple procedure, DM, fibromyalgia, and asthma presents with severe abdominal pains, nausea, vomiting, leukoctyosis, and elevated  lipase.  CT consistent with acute pancreatitis and pharmacy consulted to dose primaxin.  No history of seizures documented.  Noted allergy to amoxicillin and penicillins with N/V, skin rash, hives.  Discussed with MD, plan to monitor closely for allergic reaction.    10/24 >> Primaxin >>  Weight = 72.4kg Tm24h: 99.2 WBC: Elevated, 26.4 Renal: SCr = 0.53, CrCL 94 (CG), >100 (N)  Microbiology:  10/24 Blood cxs ordered  Goal of Therapy:  Eradication of infection  Plan:  Primaxin 500mg  IV q 6 hours.  F/u renal function, cultures, clinical course.   Koa Zoeller E 09/25/2013,7:09 AM

## 2013-09-25 NOTE — ED Notes (Signed)
Pt reports a flare up of her pancreasitis that started early afternoon. Pt reports nausea, no vomiting. 2 vicodin taken at 8pm with no relief.

## 2013-09-25 NOTE — ED Provider Notes (Signed)
Heather Wagner S 1:00 AM patient discussed signout.  Patient with prior history of recurrent pancreatitis, Whipple procedure presenting with similar severe sharp abdominal pains with nausea vomiting to previous pancreatitis. Initial CBC shows significantly elevated WBC. Patient is very tender in the abdomen. Slightly tachycardic. Afebrile. Additional lab testing and CT scan pending.  Patient with elevated lipase and inflammation around the pancreas consistent with acute pancreatitis. Patient did have some improvements with initial medications the pain continues to return significantly. She has not had any additional nausea or vomiting. At this time discussed options for continued syndromatic treatment inpatient and patient does wish to have this.  I spoke with [facility]. They will see patient and admit to regular floor under team 8.  Angus Seller, PA-C 09/25/13 [phone]

## 2013-09-25 NOTE — Progress Notes (Addendum)
[facility] PROGRESS NOTE  Heather Wagner ZOX:[id number] DOB: 05-Sep-1976 DOA: 09/25/2013 PCP: No primary provider on file.  Assessment/Plan  Acute on chronic pancreatitis with history of Whipple's procedure - patient has history of hereditary pancreatitis.  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  Clearance Coots of water  -  Continue with IV fluids -  Add phenergan 12.5mg  IV q6h prn.  If increased dose needs to be given, need to administer as infusion over 25 minutes. -  Increase dilaudid -  Continue primaxin and monitor for allergy -  TG, LDH, LFTs wnl  Diabetes mellitus  -  Continue lantus 5 units q12h -  Continue low dose SSI  Leukocytosis - probably reactionary to pancreatitis and trending down.  -  F/u blood cultures  History of bronchial asthma - presently not wheezing. Continue inhalers.  History of migraines - denies any headache at this time.  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    Objective: Filed Vitals:   09/25/13 0558 09/25/13 0649 09/25/13 1400 09/25/13 1429  BP: 123/74 126/72 131/88   Pulse: 110 98 116   Temp: 99.1 F (37.3 C) 99.2 F (37.3 C) 98.7 F (37.1 C)   TempSrc: Oral Oral Oral   Resp: 18 18 16    Height:  5\' 4"  (1.626 m)    Weight:  72.394 kg (159 lb 9.6 oz)    SpO2: 96% 100% 98% 95%    Intake/Output Summary (Last 24 hours) at 09/25/13  1734 Last data filed at 09/25/13 1552  Gross per 24 hour  Intake      0 ml  Output      2 ml  Net     -2 ml   Filed Weights   09/25/13 0649  Weight: 72.394 kg (159 lb 9.6 oz)    Exam:   General:  CF, No acute distress  HEENT:  NCAT, MMM  Cardiovascular:  RRR, nl S1, S2 no mrg, 2+ pulses, warm extremities  Respiratory:  CTAB, no increased WOB  Abdomen:   Hypoactive BS, soft, ND, mildly TTP diffusely without rebound or guarding  MSK:   Normal tone and bulk, no LEE  Neuro:  Grossly intact  Data Reviewed: Basic Metabolic Panel:  Recent Labs Lab 09/25/13 0035 09/25/13 0814  NA 132* 131*  K 3.6 3.9  CL 96 97  CO2 25 21  GLUCOSE 165* 190*  BUN 13 9  CREATININE 0.53 0.51  CALCIUM 9.5 8.7   Liver Function Tests:  Recent Labs Lab 09/25/13 0035 09/25/13 0814  AST 18 25  ALT 20 22  ALKPHOS 89 79  BILITOT 0.4 0.6  PROT 7.8 6.9  ALBUMIN 3.6 3.1*    Recent Labs Lab 09/25/13 0035 09/25/13 0814  LIPASE 67* 20   No results found for this basename: AMMONIA,  in the last 168 hours CBC:  Recent Labs Lab 09/25/13 0035 09/25/13 8119  WBC 26.4* 22.0*  NEUTROABS 21.1* 18.6*  HGB 11.4* 10.1*  HCT 35.0* 30.9*  MCV 79.0 79.0  PLT 413* 356   Cardiac Enzymes: No results found for this basename: CKTOTAL, CKMB, CKMBINDEX, TROPONINI,  in the last 168 hours BNP (last 3 results) No results found for this basename: PROBNP,  in the last 8760 hours CBG:  Recent Labs Lab 09/25/13 0842 09/25/13 1207 09/25/13 1603  GLUCAP 156* 114* 154*    No results found for this or any previous visit (from the past 240 hour(s)).   Studies: Ct Abdomen Pelvis W Contrast  09/25/2013   CLINICAL DATA:  Abdominal pain and nausea. White cell count 26.4. Feels like a pancreatitis flare up. History of pancreatitis and Whipple procedure.  EXAM: CT ABDOMEN AND PELVIS WITH CONTRAST  TECHNIQUE: Multidetector CT imaging of the abdomen and pelvis was performed using the standard protocol  following bolus administration of intravenous contrast.  CONTRAST:  OMNIPAQUE IOHEXOL 300 MG/ML  SOLN  COMPARISON:  10/06/2012  FINDINGS: Fibrosis in the lung bases. Tiny subpleural nodule in the right anterior of chest is stable.  Mild diffuse fatty infiltration of the liver. Surgical absence of the gallbladder. 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 There is evidence of the infiltration and edema around the body and tail of the pancreas with mild pancreatic ductal dilatation. This is consistent with pancreatitis. No pancreatic parenchymal necrosis. No pseudocysts. Gas in the bowel ducts without distention. This is likely postoperative. The spleen, adrenal glands, kidneys, abdominal aorta, inferior vena cava, and retroperitoneal lymph nodes are unremarkable. The stomach is normal. Small bowel are normal for degree of distention. Stool-filled colon without distention. No free air in the abdomen. Abdominal wall musculature appears intact.  Pelvis: Probable fibroid in the right side of the uterus measuring about 3.4 cm diameter. A simple appearing cyst in the right ovary measuring 3.3 cm diameter. No abnormal left adnexal masses. Bladder wall is not thickened. Stool-filled rectosigmoid colon without diverticulitis. The appendix is normal. No free or loculated pelvic fluid collections. Normal alignment of the lumbar vertebra.  IMPRESSION: Partial pancreatectomy with pancreatic ductal dilatation and peripancreatic edema consistent with acute pancreatitis. Diffuse fatty infiltration of the liver. Biliary gas is likely postoperative. Uterine fibroids. Right ovarian cyst, likely functional.   Electronically Signed   By: Burman Nieves M.D.   On: 09/25/2013 03:09    Scheduled Meds: . imipenem-cilastatin  500 mg Intravenous Q6H  . [START ON 09/26/2013] influenza vac split quadrivalent PF  0.5 mL Intramuscular Tomorrow-1000  . insulin aspart  0-9 Units Subcutaneous  TID WC  . insulin glargine  5 Units Subcutaneous BID  . mometasone-formoterol  2 puff Inhalation BID  . pantoprazole  40 mg Oral Daily   Continuous Infusions: . sodium chloride 125 mL/hr at 09/25/13 1438    Principal Problem:   Acute pancreatitis Active Problems:   Leucocytosis   Anemia   Asthma   Diabetes mellitus    Time spent: 30 min    Jasman Murri, [facility]  [facility] Pager [phone]. If 7PM-7AM, please contact night-coverage at [email or web address], password [facility] 09/25/2013, 5:34 PM  LOS: 0 days

## 2013-09-25 NOTE — H&P (Addendum)
[facility] History and Physical  [name] RUE:[id number] DOB: 06-04-1976 DOA: 09/25/2013  Referring physician: ER physician. PCP: No primary provider on file. [facility]. Specialists: Dr. Randa Evens. Gastroenterologist.   Chief Complaint:  Abdominal pain.   HPI: Heather Wagner is a 37 y.o. female with known history of hereditary pancreatitis presented to the ER because of severe abdominal pain in the epigastric area. Patient's pain started last evening which acutely worsened. It was tarry in nature radiating to the back and was associated with nausea and vomiting. Denies any diarrhea. In the ER patient had lab works done which showed significant leukocytosis and mildly elevated lipase. CT abdomen and pelvis shows features consistent with acute pancreatitis. Patient at this time has been admitted for further management. Patient presently is not hypoxic and not in acute distress. Ranson criteria for '0' hours (LDH not done) was 1 out of 4 for leukocytosis. Patient states that of recently patient has been getting more frequent pancreatitis flareups. Patient has been following up with her gastroenterologist and has been to him 2 weeks ago after a recent flareup. Patient otherwise denies any chest pain or shortness of breath diarrhea fever chills.  Review of Systems: As presented in the history of presenting illness, rest negative.  Past Medical History  Diagnosis Date  . Pancreatitis chronic   . Asthma   . Diabetes mellitus   . Fibromyalgia   . Migraines    Past Surgical History  Procedure Laterality Date  . Whiple    . Breast reduction surgery    . Dilation and curettage of uterus    . Whipple's     Social History:  reports that she has never smoked. She does not have any smokeless tobacco history on file. She reports that she does not drink alcohol or use illicit drugs. Where does patient live home. Can patient participate in ADLs? Yes.  Allergies   Allergen Reactions  . Nortriptyline Hcl Shortness Of Breath    Swelling anxiety  . Amitriptyline Other (See Comments)    sedation  . Amoxicillin Nausea And Vomiting  . Compazine [Prochlorperazine Maleate] Other (See Comments)    Neurological - uncontrollable eye movements  . Cymbalta [Duloxetine Hcl] Nausea And Vomiting    insomnia  . Doxycycline Nausea And Vomiting  . Flagyl [Metronidazole Hcl] Nausea And Vomiting    Skin rash  . Penicillins Cross Reactors Nausea And Vomiting    Skin rash/hives  . Topamax [Topiramate] Other (See Comments)    Mood change, anxiety, headaches  . Prozac [Fluoxetine Hcl] Anxiety  . Reglan [Metoclopramide Hcl] Anxiety    Family History:  Family History  Problem Relation Age of Onset  . Diabetes Other   . Hypertension Other   . Cancer Other   . Pancreatitis Mother   . Pancreatitis Maternal Uncle       Prior to Admission medications   Medication Sig Start Date End Date Taking? Authorizing Provider  albuterol (PROVENTIL HFA;VENTOLIN HFA) 108 (90 BASE) MCG/ACT inhaler Inhale 2 puffs into the lungs every 6 (six) hours as needed. Asthma   Yes Historical Provider, MD  calcium carbonate (TUMS EX) 750 MG chewable tablet Chew 2-4 tablets by mouth as needed. No more than 10 a day   Yes Historical Provider, MD  carboxymethylcellulose 1 % ophthalmic solution 1-2 drops every 2 (two) hours as needed. Dry eyes   Yes Historical Provider, MD  HYDROcodone-acetaminophen (VICODIN) 5-500 MG per tablet Take 1 tablet by mouth every 6 (six) hours as needed.  Pain   Yes Historical Provider, MD  ibuprofen (ADVIL,MOTRIN) 200 MG tablet Take 400 mg by mouth every 6 (six) hours as needed. pain   Yes Historical Provider, MD  insulin aspart (NOVOLOG) 100 UNIT/ML injection Inject into the skin 3 (three) times daily before meals. 1 unit per 10 carb for levels over 150   Yes Historical Provider, MD  insulin glargine (LANTUS) 100 UNIT/ML injection Inject 17 Units into the skin 2 (two)  times daily.    Yes Historical Provider, MD  mometasone-formoterol (DULERA) 100-5 MCG/ACT AERO Inhale 2 puffs into the lungs 2 (two) times daily.   Yes Historical Provider, MD  omeprazole (PRILOSEC) 20 MG capsule Take 20 mg by mouth daily.   Yes Historical Provider, MD  OVER THE COUNTER MEDICATION See admin instructions. Calcium Magnesium complex 1000mg -500mg  2 tablets with a meal   Yes Historical Provider, MD  Pancrelipase, Lip-Prot-Amyl, (ZENPEP PO) Take 2 capsules by mouth 3 (three) times daily. [id number]   Yes Historical Provider, MD  promethazine (PHENERGAN) 25 MG suppository Place 25 mg rectally every 6 (six) hours as needed. Nausea when not tolerating by mouth   Yes Historical Provider, MD  promethazine (PHENERGAN) 25 MG tablet Take 25 mg by mouth every 6 (six) hours as needed. Nausea   Yes Historical Provider, MD  saccharomyces boulardii (FLORASTOR) 250 MG capsule Take 250 mg by mouth 2 (two) times daily between meals.   Yes Historical Provider, MD  beclomethasone (QVAR) 80 MCG/ACT inhaler Inhale 1 puff into the lungs as needed.     Historical Provider, MD  rizatriptan (MAXALT) 10 MG tablet Take 10 mg by mouth as needed. May repeat in 2 hours if needed for headaches    Historical Provider, MD    Physical Exam: Filed Vitals:   09/25/13 0230 09/25/13 0426 09/25/13 0558 09/25/13 0649  BP: 137/91 145/88 123/74 126/72  Pulse: 113 111 110 98  Temp:   99.1 F (37.3 C) 99.2 F (37.3 C)  TempSrc:   Oral Oral  Resp:  18 18 18   Height:    5\' 4"  (1.626 m)  Weight:    72.394 kg (159 lb 9.6 oz)  SpO2: 100% 96% 96% 100%     General:  Well-developed and nourished.  Eyes: Anicteric no pallor.  ENT: No discharge from the ears eyes nose mouth.  Neck: No mass felt.  Cardiovascular: S1-S2 heard.  Respiratory: No rhonchi or crepitations.  Abdomen: Soft mild tenderness present. No guarding no rigidity.  Skin: No rash.  Musculoskeletal: No edema.  Psychiatric: Appears  normal.  Neurologic: Alert awake oriented to time place and person. Moves all extremities.  Labs on Admission:  Basic Metabolic Panel:  Recent Labs Lab 09/25/13 0035  NA 132*  K 3.6  CL 96  CO2 25  GLUCOSE 165*  BUN 13  CREATININE 0.53  CALCIUM 9.5   Liver Function Tests:  Recent Labs Lab 09/25/13 0035  AST 18  ALT 20  ALKPHOS 89  BILITOT 0.4  PROT 7.8  ALBUMIN 3.6    Recent Labs Lab 09/25/13 0035  LIPASE 67*   No results found for this basename: AMMONIA,  in the last 168 hours CBC:  Recent Labs Lab 09/25/13 0035  WBC 26.4*  NEUTROABS 21.1*  HGB 11.4*  HCT 35.0*  MCV 79.0  PLT 413*   Cardiac Enzymes: No results found for this basename: CKTOTAL, CKMB, CKMBINDEX, TROPONINI,  in the last 168 hours  BNP (last 3 results) No results found for this basename:  PROBNP,  in the last 8760 hours CBG: No results found for this basename: GLUCAP,  in the last 168 hours  Radiological Exams on Admission: Ct Abdomen Pelvis W Contrast  09/25/2013   CLINICAL DATA:  Abdominal pain and nausea. White cell count 26.4. Feels like a pancreatitis flare up. History of pancreatitis and Whipple procedure.  EXAM: CT ABDOMEN AND PELVIS WITH CONTRAST  TECHNIQUE: Multidetector CT imaging of the abdomen and pelvis was performed using the standard protocol following bolus administration of intravenous contrast.  CONTRAST:  OMNIPAQUE IOHEXOL 300 MG/ML  SOLN  COMPARISON:  10/06/2012  FINDINGS: Fibrosis in the lung bases. Tiny subpleural nodule in the right anterior of chest is stable.  Mild diffuse fatty infiltration of the liver. Surgical absence of the gallbladder. 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 There is evidence of the infiltration and edema around the body and tail of the pancreas with mild pancreatic ductal dilatation. This is consistent with pancreatitis. No pancreatic parenchymal necrosis. No pseudocysts. Gas in the bowel ducts  without distention. This is likely postoperative. The spleen, adrenal glands, kidneys, abdominal aorta, inferior vena cava, and retroperitoneal lymph nodes are unremarkable. The stomach is normal. Small bowel are normal for degree of distention. Stool-filled colon without distention. No free air in the abdomen. Abdominal wall musculature appears intact.  Pelvis: Probable fibroid in the right side of the uterus measuring about 3.4 cm diameter. A simple appearing cyst in the right ovary measuring 3.3 cm diameter. No abnormal left adnexal masses. Bladder wall is not thickened. Stool-filled rectosigmoid colon without diverticulitis. The appendix is normal. No free or loculated pelvic fluid collections. Normal alignment of the lumbar vertebra.  IMPRESSION: Partial pancreatectomy with pancreatic ductal dilatation and peripancreatic edema consistent with acute pancreatitis. Diffuse fatty infiltration of the liver. Biliary gas is likely postoperative. Uterine fibroids. Right ovarian cyst, likely functional.   Electronically Signed   By: Burman Nieves M.D.   On: 09/25/2013 03:09     Assessment/Plan Principal Problem:   Acute pancreatitis Active Problems:   Leucocytosis   Anemia   Asthma   Diabetes mellitus   1. Acute on chronic pancreatitis with history of Whipple's procedure - patient has history of hereditary pancreatitis. Keep patient n.p.o. for now. Continue with IV fluids and pain relief medications. There is as in the biliary tract which I discussed with radiologist and is most likely secondary to her Whipple's. Patient has leukocytosis and mildly febrile for now I have placed patient on Primaxin until cultures are available and also based on patient's clinical course. I have ordered repeat labs including LDH, lipid panel and CRP levels. 2. Diabetes mellitus - since patient is n.p.o. I have decreased patient's Lantus dose from 18 units Q. 12 to 5 units subcutaneous every 12 with sliding-scale coverage  and closely follow CBGs. 3. Leukocytosis - probably reactionary to pancreatitis. Check blood cultures and see #1. 4. History of bronchial asthma - presently not wheezing. Continue inhalers. 5. History of migraines - denies any headache at this time. 6. Anemia - follow CBC.    Code Status: Full code.  Family Communication: None.  Disposition Plan: Admit to inpatient.    Ernesto Lashway N. [facility] Pager [phone].  If 7PM-7AM, please contact night-coverage [email or web address] Password [facility] 09/25/2013, 6:57 AM

## 2013-09-25 NOTE — ED Provider Notes (Signed)
CSN: [id number]     Arrival date & time 09/25/13  0003 History   First MD Initiated Contact with Patient 09/25/13 0024     Chief Complaint  Patient presents with  . Abdominal Pain   (Consider location/radiation/quality/duration/timing/severity/associated sxs/prior Treatment) Patient is a 37 y.o. female presenting with abdominal pain. The history is provided by the patient. No language interpreter was used.  Abdominal Pain Pain location:  LUQ and epigastric Pain quality: sharp   Pain radiates to:  Back Associated symptoms: nausea   Associated symptoms: no chest pain, no diarrhea, no dysuria, no fever, no shortness of breath and no vomiting   Associated symptoms comment:  Abdominal pain similar to history of recurrent pancreatitis she has had for the past 20 years. Nausea without vomiting. No diarrhea. No fever. Symptoms started earlier today. She reports that this episode is worse pain than usual, and also that the frequency of her episodes is increasing. She is followed by Dr. Randa Evens (GI).   Past Medical History  Diagnosis Date  . Pancreatitis chronic   . Asthma   . Diabetes mellitus   . Fibromyalgia   . Migraines    Past Surgical History  Procedure Laterality Date  . Whiple    . Breast reduction surgery    . Dilation and curettage of uterus     Family History  Problem Relation Age of Onset  . Diabetes Other   . Hypertension Other   . Cancer Other    History  Substance Use Topics  . Smoking status: Never Smoker   . Smokeless tobacco: Not on file  . Alcohol Use: No   OB History   Grav Para Term Preterm Abortions TAB SAB Ect Mult Living                 Review of Systems  Constitutional: Negative for fever.  Respiratory: Negative for shortness of breath.   Cardiovascular: Negative for chest pain.  Gastrointestinal: Positive for nausea and abdominal pain. Negative for vomiting and diarrhea.  Genitourinary: Negative for dysuria.    Allergies  Nortriptyline hcl;  Amitriptyline; Amoxicillin; Compazine; Cymbalta; Doxycycline; Flagyl; Penicillins cross reactors; Topamax; Prozac; and Reglan  Home Medications   Current Outpatient Rx  Name  Route  Sig  Dispense  Refill  . albuterol (PROVENTIL HFA;VENTOLIN HFA) 108 (90 BASE) MCG/ACT inhaler   Inhalation   Inhale 2 puffs into the lungs every 6 (six) hours as needed. Asthma         . calcium carbonate (TUMS EX) 750 MG chewable tablet   Oral   Chew 2-4 tablets by mouth as needed. No more than 10 a day         . carboxymethylcellulose 1 % ophthalmic solution      1-2 drops every 2 (two) hours as needed. Dry eyes         . HYDROcodone-acetaminophen (VICODIN) 5-500 MG per tablet   Oral   Take 1 tablet by mouth every 6 (six) hours as needed. Pain         . ibuprofen (ADVIL,MOTRIN) 200 MG tablet   Oral   Take 400 mg by mouth every 6 (six) hours as needed. pain         . insulin aspart (NOVOLOG) 100 UNIT/ML injection   Subcutaneous   Inject into the skin 3 (three) times daily before meals. 1 unit per 10 carb for levels over 150         . insulin glargine (LANTUS) 100 UNIT/ML injection  Subcutaneous   Inject 17 Units into the skin 2 (two) times daily.          . mometasone-formoterol (DULERA) 100-5 MCG/ACT AERO   Inhalation   Inhale 2 puffs into the lungs 2 (two) times daily.         Marland Kitchen omeprazole (PRILOSEC) 20 MG capsule   Oral   Take 20 mg by mouth daily.         Marland Kitchen OVER THE COUNTER MEDICATION      See admin instructions. Calcium Magnesium complex 1000mg -500mg  2 tablets with a meal         . Pancrelipase, Lip-Prot-Amyl, (ZENPEP PO)   Oral   Take 2 capsules by mouth 3 (three) times daily. [id number]         . promethazine (PHENERGAN) 25 MG suppository   Rectal   Place 25 mg rectally every 6 (six) hours as needed. Nausea when not tolerating by mouth         . promethazine (PHENERGAN) 25 MG tablet   Oral   Take 25 mg by mouth every 6 (six) hours as needed.  Nausea         . saccharomyces boulardii (FLORASTOR) 250 MG capsule   Oral   Take 250 mg by mouth 2 (two) times daily between meals.         . beclomethasone (QVAR) 80 MCG/ACT inhaler   Inhalation   Inhale 1 puff into the lungs as needed.          . rizatriptan (MAXALT) 10 MG tablet   Oral   Take 10 mg by mouth as needed. May repeat in 2 hours if needed for headaches          BP 136/90  Pulse 105  Temp(Src) 98.2 F (36.8 C) (Oral)  Resp 20  SpO2 98%  LMP 09/09/2013 Physical Exam  Constitutional: She is oriented to person, place, and time. She appears well-developed and well-nourished.  Uncomfortable appearing.   HENT:  Head: Normocephalic.  Neck: Normal range of motion. Neck supple.  Cardiovascular: Normal rate and regular rhythm.   Pulmonary/Chest: Effort normal and breath sounds normal.  Abdominal: Soft. Bowel sounds are normal. She exhibits no mass. There is tenderness. There is no rebound and no guarding.  Diffuse abdominal pain worse in upper quadrants. Soft abdomen.   Musculoskeletal: Normal range of motion.  Neurological: She is alert and oriented to person, place, and time.  Skin: Skin is warm and dry. No rash noted.  Psychiatric: She has a normal mood and affect.    ED Course  Procedures (including critical care time) Labs Review Labs Reviewed  CBC WITH DIFFERENTIAL  COMPREHENSIVE METABOLIC PANEL  LIPASE, BLOOD  URINALYSIS, ROUTINE W REFLEX MICROSCOPIC   Imaging Review No results found.  EKG Interpretation   None       MDM  No diagnosis found.   Patient care transferred to Dr. Rhunette Croft pending labs.    Arnoldo Hooker, PA-C 09/25/13 [phone]

## 2013-09-25 NOTE — ED Provider Notes (Signed)
Medical screening examination/treatment/procedure(s) were performed by non-physician practitioner and as supervising physician I was immediately available for consultation/collaboration.  EKG Interpretation   None        Derwood Kaplan, MD 09/25/13 [phone]

## 2013-09-25 NOTE — Progress Notes (Signed)
Patient alert continue to C/O abdominal pain, nausea and vomited x2, notified Dr. Malachi Bonds notified, she increased IV dilaudid and phenergan. will continue to assess patient.

## 2013-09-26 DIAGNOSIS — J45909 Unspecified asthma, uncomplicated: Secondary | ICD-10-CM

## 2013-09-26 LAB — BASIC METABOLIC PANEL
BUN: 8 mg/dL (ref 6–23)
CO2: 24 mEq/L (ref 19–32)
Calcium: 7.6 mg/dL — ABNORMAL LOW (ref 8.4–10.5)
Chloride: 103 mEq/L (ref 96–112)
Creatinine, Ser: 0.52 mg/dL (ref 0.50–1.10)
GFR calc Af Amer: >90 mL/min (ref >90)
Glucose, Bld: 102 mg/dL — ABNORMAL HIGH (ref 70–99)
Potassium: 3.3 mEq/L — ABNORMAL LOW (ref 3.5–5.1)

## 2013-09-26 LAB — GLUCOSE, CAPILLARY
Glucose-Capillary: 139 mg/dL — ABNORMAL HIGH (ref 70–99)
Glucose-Capillary: 158 mg/dL — ABNORMAL HIGH (ref 70–99)
Glucose-Capillary: 177 mg/dL — ABNORMAL HIGH (ref 70–99)
Glucose-Capillary: 73 mg/dL (ref 70–99)
Glucose-Capillary: 86 mg/dL (ref 70–99)
Glucose-Capillary: 99 mg/dL (ref 70–99)

## 2013-09-26 LAB — IRON AND TIBC
Iron: 46 ug/dL (ref 42–135)
Saturation Ratios: 15 % — ABNORMAL LOW (ref 20–55)

## 2013-09-26 LAB — CBC
HCT: 29.1 % — ABNORMAL LOW (ref 36.0–46.0)
Hemoglobin: 9.3 g/dL — ABNORMAL LOW (ref 12.0–15.0)
MCH: 25.5 pg — ABNORMAL LOW (ref 26.0–34.0)
MCHC: 32 g/dL (ref 30.0–36.0)
MCV: 79.9 fL (ref 78.0–100.0)
RDW: 15.3 % (ref 11.5–15.5)

## 2013-09-26 LAB — VITAMIN B12: Vitamin B-12: 406 pg/mL (ref 211–911)

## 2013-09-26 MED ORDER — ALBUTEROL SULFATE HFA 108 (90 BASE) MCG/ACT IN AERS
2.0000 | INHALATION_SPRAY | Freq: Two times a day (BID) | RESPIRATORY_TRACT | Status: DC
  Administered 2013-09-27: 2 via RESPIRATORY_TRACT
  Filled 2013-09-26: qty 6.7

## 2013-09-26 MED ORDER — INSULIN GLARGINE 100 UNIT/ML ~~LOC~~ SOLN
5.0000 [IU] | Freq: Every day | SUBCUTANEOUS | Status: DC
  Administered 2013-09-26 – 2013-09-27 (×2): 5 [IU] via SUBCUTANEOUS
  Filled 2013-09-26 (×2): qty 0.05

## 2013-09-26 MED ORDER — DOCUSATE SODIUM 100 MG PO CAPS
100.0000 mg | ORAL_CAPSULE | Freq: Two times a day (BID) | ORAL | Status: DC
  Administered 2013-09-26 – 2013-09-27 (×3): 100 mg via ORAL
  Filled 2013-09-26 (×4): qty 1

## 2013-09-26 MED ORDER — SENNA 8.6 MG PO TABS
2.0000 | ORAL_TABLET | Freq: Every day | ORAL | Status: DC
  Administered 2013-09-26: 17.2 mg via ORAL
  Filled 2013-09-26 (×2): qty 2

## 2013-09-26 MED ORDER — POTASSIUM CHLORIDE 10 MEQ/100ML IV SOLN
10.0000 meq | INTRAVENOUS | Status: AC
  Administered 2013-09-26 (×2): 10 meq via INTRAVENOUS
  Filled 2013-09-26 (×2): qty 100

## 2013-09-26 MED ORDER — POLYETHYLENE GLYCOL 3350 17 G PO PACK
17.0000 g | PACK | Freq: Every day | ORAL | Status: DC | PRN
  Filled 2013-09-26: qty 1

## 2013-09-26 MED ORDER — SUMATRIPTAN SUCCINATE 50 MG PO TABS
50.0000 mg | ORAL_TABLET | Freq: Once | ORAL | Status: AC
  Administered 2013-09-26: 50 mg via ORAL
  Filled 2013-09-26: qty 1

## 2013-09-26 NOTE — Progress Notes (Signed)
Pt states that she is taking her home med "Zen-Pep" (an enzyme) prior to eating.  States she cannot eat without taking these pills (2 or 3 before meals daily). Advised pt to report to MD.

## 2013-09-26 NOTE — Progress Notes (Signed)
Pt would like to have Flu vaccine on the day she is discharged from the hospital.  Will have Pharmacy reschedule.

## 2013-09-26 NOTE — Progress Notes (Signed)
[facility] PROGRESS NOTE  Heather Wagner ZOX:[id number] DOB: [birth year] DOA: 09/25/2013 PCP: No primary provider on file.  Assessment/Plan  Acute on chronic pancreatitis with history of Whipple's procedure - patient has history of hereditary pancreatitis.  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  Pain comes in waves with some associated nausea. -  CLD -  Will need to restart creon once advancing diet -  Continue with IV fluids -  Continue IV dilaudid until tolerating liquids.  Once tolerating liquids well, will convert to oral pain medication -  Continue IV phenergan until tolerating liquids -  D/c primaxin because patient refusing:  Makes abdominal pain and nausea worse.  She did not have any facial swelling, wheezing, hives -  TG, LDH, LFTs wnl  Diabetes mellitus with mildly low fingerstick this morning -  Decrease lantus to 5 units daily -  Continue low dose SSI  Leukocytosis - resolved. -  Blood cultures NGTD  History of bronchial asthma - presently not wheezing. Continue inhalers.  History of migraines - denies any headache at this time.  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  She had some vomiting overnight.  Still passing gas.    Objective: Filed Vitals:   09/25/13 1429 09/25/13 2134 09/26/13 0359 09/26/13 0840  BP:  107/69 104/61   Pulse:  102 85   Temp:  99.1 F (37.3 C) 98.6 F (37 C)   TempSrc:   Oral Oral   Resp:  16 16   Height:      Weight:   72.5 kg (159 lb 13.3 oz)   SpO2: 95% 98% 97% 98%    Intake/Output Summary (Last 24 hours) at 09/26/13 0932 Last data filed at 09/25/13 1759  Gross per 24 hour  Intake 1268.75 ml  Output      0 ml  Net 1268.75 ml   Filed Weights   09/25/13 0649 09/26/13 0359  Weight: 72.394 kg (159 lb 9.6 oz) 72.5 kg (159 lb 13.3 oz)    Exam:   General:  CF, No acute distress  HEENT:  NCAT, MMM  Cardiovascular:  RRR, nl S1, S2 no mrg, 2+ pulses, warm extremities  Respiratory:  CTAB, no increased WOB  Abdomen:   NABS, soft, ND, mildly TTP in epigastrium without rebound or guarding  MSK:   Normal tone and bulk, no LEE  Neuro:  Grossly intact  Data Reviewed: Basic Metabolic Panel:  Recent Labs Lab 09/25/13 0035 09/25/13 0814 09/26/13 0450  NA 132* 131* 135  K 3.6 3.9 3.3*  CL 96 97 103  CO2 25 21 24   GLUCOSE 165* 190* 102*  BUN 13 9 8   CREATININE 0.53 0.51 0.52  CALCIUM 9.5 8.7 7.6*   Liver Function Tests:  Recent Labs Lab 09/25/13 0035 09/25/13 0814  AST 18 25  ALT 20 22  ALKPHOS 89 79  BILITOT 0.4 0.6  PROT 7.8 6.9  ALBUMIN 3.6 3.1*    Recent  Labs Lab 09/25/13 0035 09/25/13 0814  LIPASE 67* 20   No results found for this basename: AMMONIA,  in the last 168 hours CBC:  Recent Labs Lab 09/25/13 0035 09/25/13 0814 09/26/13 0450  WBC 26.4* 22.0* 10.3  NEUTROABS 21.1* 18.6*  --   HGB 11.4* 10.1* 9.3*  HCT 35.0* 30.9* 29.1*  MCV 79.0 79.0 79.9  PLT 413* 356 310   Cardiac Enzymes: No results found for this basename: CKTOTAL, CKMB, CKMBINDEX, TROPONINI,  in the last 168 hours BNP (last 3 results) No results found for this basename: PROBNP,  in the last 8760 hours CBG:  Recent Labs Lab 09/25/13 1603 09/25/13 2007 09/26/13 0238 09/26/13 0358 09/26/13 0809  GLUCAP 154* 91 73 99 86    No results found for this or any previous visit (from the past 240 hour(s)).   Studies: Ct Abdomen Pelvis W  Contrast  09/25/2013   CLINICAL DATA:  Abdominal pain and nausea. White cell count 26.4. Feels like a pancreatitis flare up. History of pancreatitis and Whipple procedure.  EXAM: CT ABDOMEN AND PELVIS WITH CONTRAST  TECHNIQUE: Multidetector CT imaging of the abdomen and pelvis was performed using the standard protocol following bolus administration of intravenous contrast.  CONTRAST:  OMNIPAQUE IOHEXOL 300 MG/ML  SOLN  COMPARISON:  10/06/2012  FINDINGS: Fibrosis in the lung bases. Tiny subpleural nodule in the right anterior of chest is stable.  Mild diffuse fatty infiltration of the liver. Surgical absence of the gallbladder. 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 There is evidence of the infiltration and edema around the body and tail of the pancreas with mild pancreatic ductal dilatation. This is consistent with pancreatitis. No pancreatic parenchymal necrosis. No pseudocysts. Gas in the bowel ducts without distention. This is likely postoperative. The spleen, adrenal glands, kidneys, abdominal aorta, inferior vena cava, and retroperitoneal lymph nodes are unremarkable. The stomach is normal. Small bowel are normal for degree of distention. Stool-filled colon without distention. No free air in the abdomen. Abdominal wall musculature appears intact.  Pelvis: Probable fibroid in the right side of the uterus measuring about 3.4 cm diameter. A simple appearing cyst in the right ovary measuring 3.3 cm diameter. No abnormal left adnexal masses. Bladder wall is not thickened. Stool-filled rectosigmoid colon without diverticulitis. The appendix is normal. No free or loculated pelvic fluid collections. Normal alignment of the lumbar vertebra.  IMPRESSION: Partial pancreatectomy with pancreatic ductal dilatation and peripancreatic edema consistent with acute pancreatitis. Diffuse fatty infiltration of the liver. Biliary gas is likely postoperative. Uterine fibroids.  Right ovarian cyst, likely functional.   Electronically Signed   By: Burman Nieves M.D.   On: 09/25/2013 03:09    Scheduled Meds: . imipenem-cilastatin  500 mg Intravenous Q6H  . influenza vac split quadrivalent PF  0.5 mL Intramuscular Tomorrow-1000  . insulin aspart  0-9 Units Subcutaneous TID WC  . insulin glargine  5 Units Subcutaneous BID  . mometasone-formoterol  2 puff Inhalation BID  . pantoprazole  40 mg Oral Daily  . potassium chloride  10 mEq Intravenous Q1 Hr x 2   Continuous Infusions:    Principal Problem:   Acute pancreatitis Active Problems:   Leucocytosis   Microcytic anemia   Asthma   Diabetes mellitus   Hyponatremia    Time spent: 30 min    Mitsuo Budnick, [facility]  [facility] Pager [phone]. If 7PM-7AM, please contact night-coverage at [email or web address], password [facility] 09/26/2013, 9:32 AM  LOS: 1 day

## 2013-09-26 NOTE — Progress Notes (Signed)
Noticed RN note about pancrelipase.  Called pt to notify her that on clear liquid diet, she is not receiving enough fat and protein to need capsules.  Will start once diet advanced.    Pt states she is getting migraine HA and asked for maxalt.  Not on formulary and patient was okay with imitrex.  Ordered one time dose.

## 2013-09-27 LAB — GLUCOSE, CAPILLARY
Glucose-Capillary: 103 mg/dL — ABNORMAL HIGH (ref 70–99)
Glucose-Capillary: 172 mg/dL — ABNORMAL HIGH (ref 70–99)

## 2013-09-27 MED ORDER — KETOROLAC TROMETHAMINE 10 MG PO TABS
10.0000 mg | ORAL_TABLET | Freq: Once | ORAL | Status: AC
  Administered 2013-09-27: 10 mg via ORAL
  Filled 2013-09-27 (×2): qty 1

## 2013-09-27 MED ORDER — FERROUS SULFATE 325 (65 FE) MG PO TABS: 325.0000 mg | ORAL_TABLET | Freq: Every day | ORAL | Status: DC

## 2013-09-27 MED ORDER — POLYETHYLENE GLYCOL 3350 17 G PO PACK: 17.0000 g | PACK | Freq: Every day | ORAL | Status: DC | PRN

## 2013-09-27 NOTE — Discharge Summary (Signed)
Physician Discharge Summary  Shylyn Younce ZOX:[id number] DOB: 11/07/1976 DOA: 09/25/2013  PCP: No primary provider on file.  Admit date: 09/25/2013 Discharge date: 09/27/2013  Recommendations for Outpatient Follow-up:  1. Follow up with gastroenterologist within 1 week due to increasing flares of pancreatitis and to repeat electrolytes and screen for complications.   2. PCP or GI MD:  Please follow up on final report of pending blood cultures and folate  Discharge Diagnoses:  Principal Problem:   Acute pancreatitis Active Problems:   Leucocytosis   Microcytic anemia   Asthma   Diabetes mellitus   Hyponatremia   Discharge Condition: stable, improved  Diet recommendation:  Clear liquids and advance as tolerated  Wt Readings from Last 3 Encounters:  09/27/13 68.811 kg (151 lb 11.2 oz)    History of present illness:  Heather Wagner is a 37 y.o. female with known history of hereditary pancreatitis presented to the ER because of severe abdominal pain in the epigastric area. Patient's pain started last evening which acutely worsened. It was tarry in nature radiating to the back and was associated with nausea and vomiting. Denies any diarrhea. In the ER patient had lab works done which showed significant leukocytosis and mildly elevated lipase. CT abdomen and pelvis shows features consistent with acute pancreatitis. Patient at this time has been admitted for further management. Patient presently is not hypoxic and not in acute distress. Ranson criteria for '0' hours (LDH not done) was 1 out of 4 for leukocytosis. Patient states that of recently patient has been getting more frequent pancreatitis flareups. Patient has been following up with her gastroenterologist and has been to him 2 weeks ago after a recent flareup. Patient otherwise denies any chest pain or shortness of breath diarrhea fever chills.  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  Dilation of pancreatic duct seen on CT is chronic post Whipple. Pain came in waves with some associated nausea.  She was started on a clear liquid diet and given 1-2 mg IV dilaudid as needed every 3 hours as needed with adequate pain relief.  She received IV phenergan via infusion.  Initially she was started on IV antibiotics, but she stated the primaxin made her abdominal pain worse so she refused it and it was discontinued.  Her leukocytosis trended down, she remained afebrile, and her cultures were negative.  She is asking to go home today.  I voiced that I was concerned that given the degree of inflammation on her CT, she may have further exacerbations of her pain or she may develop complication.  She feels comfortable going home and states that she will be able to manage her pain with her oral medications.  She will call her gastroenterologist if her pain becomes unbearable.  She was able to stay hydrated with oral fluids prior to discharge and will advance her diet slowly at home.  TG, LDH, LFTs wnl.  Diabetes mellitus with mildly low fingerstick this morning.  A1c 8.2.  Her lantus was decreased due to acute illness and she was advised to use lantus 5 units today and adjust her long acting insulin tomorrow morning if her morning fingerstick is high or low.  She states she understands and is comfortable adjusting her insulin during her pancreatitis flares.    Leukocytosis - resolved.  Blood cultures NGTD   History of bronchial asthma - presently not wheezing. Continued inhalers.   History of migraines -  increased HA on 10/25.  Toradol and imitrex were not helpful.  She will take her maxalt at home.    Hyponatremia likely due dehydration.  Resolved with IVF.    Microcytic anemia and at risk for vitamin and mineral deficiencies due to Whipple.  Appears to have some anemia of chronic disease and possibly underlying iron deficiency.  Vit B12  406.  Folate pending  Consultants:  none Procedures:  CT abd/pelvis Antibiotics:  primaxin 10/23 >> 10/25   Discharge Exam: Filed Vitals:   09/27/13 0439  BP: 111/63  Pulse: 87  Temp: 98.4 F (36.9 C)  Resp: 16   Filed Vitals:   09/26/13 2130 09/27/13 0439 09/27/13 0500 09/27/13 0736  BP: 114/62 111/63    Pulse: 91 87    Temp: 98.4 F (36.9 C) 98.4 F (36.9 C)    TempSrc: Oral Oral    Resp:  16    Height:      Weight:   68.811 kg (151 lb 11.2 oz)   SpO2: 98% 98%  97%   Tolerating clears.  Minimal nausea, no vomiting.  Had BM this AM.  Wants to go home. Migraine unimproved.  General: CF, No acute distress  HEENT: NCAT, MMM  Cardiovascular: RRR, nl S1, S2 no mrg, 2+ pulses, warm extremities  Respiratory: CTAB, no increased WOB  Abdomen: NABS, soft, ND, mildly TTP in epigastrium without rebound or guarding, stable from yesterday MSK: Normal tone and bulk, no LEE  Neuro: Grossly intact   Discharge Instructions      Discharge Orders   Future Orders Complete By Expires   Call MD for:  difficulty breathing, headache or visual disturbances  As directed    Call MD for:  extreme fatigue  As directed    Call MD for:  hives  As directed    Call MD for:  persistant dizziness or light-headedness  As directed    Call MD for:  persistant nausea and vomiting  As directed    Call MD for:  severe uncontrolled pain  As directed    Call MD for:  temperature >100.4  As directed    Diet general  As directed    Comments:     Clear liquids for now and once starting more solid foods, restart pancreatic enzymes.   Discharge instructions  As directed    Comments:     You were hospitalized with pancreatitis.  Please drink a clear liquid diet and advance to more solid foods as tolerated.  If your abdominal pain worsens, you may resume clear liquids.  If you have worsening pain not controlled by oral medications or worsening vomiting resulting in dehydration, return to the hospital.   Please follow up with your gastroenterologist in 1 week for repeat blood work and to follow up on the folate level and blood cultures which are currently pending in the lab.  If you cannot get an appointment with Dr. Randa Evens, see your primary care doctor instead.  Your iron tests suggest that you have some iron deficiency.  Please start iron once daily after your episode of pancreatitis has improved.  Prescription provided.   Increase activity slowly  As directed        Medication List         albuterol 108 (90 BASE) MCG/ACT inhaler  Commonly known as:  PROVENTIL HFA;VENTOLIN HFA  Inhale 2 puffs into the lungs every 6 (six) hours as needed. Asthma     beclomethasone 80 MCG/ACT inhaler  Commonly  known as:  QVAR  Inhale 1 puff into the lungs as needed.     calcium carbonate 750 MG chewable tablet  Commonly known as:  TUMS EX  Chew 2-4 tablets by mouth as needed. No more than 10 a day     carboxymethylcellulose 1 % ophthalmic solution  1-2 drops every 2 (two) hours as needed. Dry eyes     HYDROcodone-acetaminophen 5-500 MG per tablet  Commonly known as:  VICODIN  Take 1 tablet by mouth every 6 (six) hours as needed. Pain     ibuprofen 200 MG tablet  Commonly known as:  ADVIL,MOTRIN  Take 400 mg by mouth every 6 (six) hours as needed. pain     insulin aspart 100 UNIT/ML injection  Commonly known as:  novoLOG  Inject into the skin 3 (three) times daily before meals. 1 unit per 10 carb for levels over 150     insulin glargine 100 UNIT/ML injection  Commonly known as:  LANTUS  Inject 17 Units into the skin 2 (two) times daily.     mometasone-formoterol 100-5 MCG/ACT Aero  Commonly known as:  DULERA  Inhale 2 puffs into the lungs 2 (two) times daily.     omeprazole 20 MG capsule  Commonly known as:  PRILOSEC  Take 20 mg by mouth daily.     OVER THE COUNTER MEDICATION  See admin instructions. Calcium Magnesium complex 1000mg -500mg  2 tablets with a meal     polyethylene glycol  packet  Commonly known as:  MIRALAX / GLYCOLAX  Take 17 g by mouth daily as needed.     promethazine 25 MG tablet  Commonly known as:  PHENERGAN  Take 25 mg by mouth every 6 (six) hours as needed. Nausea     promethazine 25 MG suppository  Commonly known as:  PHENERGAN  Place 25 mg rectally every 6 (six) hours as needed. Nausea when not tolerating by mouth     rizatriptan 10 MG tablet  Commonly known as:  MAXALT  Take 10 mg by mouth as needed. May repeat in 2 hours if needed for headaches     saccharomyces boulardii 250 MG capsule  Commonly known as:  FLORASTOR  Take 250 mg by mouth 2 (two) times daily between meals.     ZENPEP PO  Take 2 capsules by mouth 3 (three) times daily. [id number]       Follow-up Information   Follow up with EDWARDS JR,JAMES L, MD. Schedule an appointment as soon as possible for a visit in 1 week.   Specialty:  Gastroenterology   Contact information:   [address]                         [place] [address] [phone]       Follow up with primary care doctor. Schedule an appointment as soon as possible for a visit in 1 week. (if Dr. Randa Evens not available.)       The results of significant diagnostics from this hospitalization (including imaging, microbiology, ancillary and laboratory) are listed below for reference.    Significant Diagnostic Studies: Ct Abdomen Pelvis W Contrast  09/25/2013   CLINICAL DATA:  Abdominal pain and nausea. White cell count 26.4. Feels like a pancreatitis flare up. History of pancreatitis and Whipple procedure.  EXAM: CT ABDOMEN AND PELVIS WITH CONTRAST  TECHNIQUE: Multidetector CT imaging of the abdomen and pelvis was performed using the standard protocol following  bolus administration of intravenous contrast.  CONTRAST:  OMNIPAQUE IOHEXOL 300 MG/ML  SOLN  COMPARISON:  10/06/2012  FINDINGS: Fibrosis in the lung bases. Tiny subpleural nodule in the right anterior of chest is  stable.  Mild diffuse fatty infiltration of the liver. Surgical absence of the gallbladder. 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 There is evidence of the infiltration and edema around the body and tail of the pancreas with mild pancreatic ductal dilatation. This is consistent with pancreatitis. No pancreatic parenchymal necrosis. No pseudocysts. Gas in the bowel ducts without distention. This is likely postoperative. The spleen, adrenal glands, kidneys, abdominal aorta, inferior vena cava, and retroperitoneal lymph nodes are unremarkable. The stomach is normal. Small bowel are normal for degree of distention. Stool-filled colon without distention. No free air in the abdomen. Abdominal wall musculature appears intact.  Pelvis: Probable fibroid in the right side of the uterus measuring about 3.4 cm diameter. A simple appearing cyst in the right ovary measuring 3.3 cm diameter. No abnormal left adnexal masses. Bladder wall is not thickened. Stool-filled rectosigmoid colon without diverticulitis. The appendix is normal. No free or loculated pelvic fluid collections. Normal alignment of the lumbar vertebra.  IMPRESSION: Partial pancreatectomy with pancreatic ductal dilatation and peripancreatic edema consistent with acute pancreatitis. Diffuse fatty infiltration of the liver. Biliary gas is likely postoperative. Uterine fibroids. Right ovarian cyst, likely functional.   Electronically Signed   By: Burman Nieves M.D.   On: 09/25/2013 03:09    Microbiology: Recent Results (from the past 240 hour(s))  CULTURE, BLOOD (ROUTINE X 2)     Status: None   Collection Time    09/25/13  7:20 AM      Result Value Range Status   Specimen Description BLOOD LEFT ARM   Final   Special Requests BOTTLES DRAWN AEROBIC AND ANAEROBIC 10CC   Final   Culture  Setup Time     Final   Value: 09/25/2013 10:11     Performed at [facility]   Culture     Final   Value:         BLOOD CULTURE RECEIVED NO GROWTH TO DATE CULTURE WILL BE HELD FOR 5 DAYS BEFORE ISSUING A FINAL NEGATIVE REPORT     Performed at [facility]   Report Status PENDING   Incomplete  CULTURE, BLOOD (ROUTINE X 2)     Status: None   Collection Time    09/25/13  7:30 AM      Result Value Range Status   Specimen Description BLOOD LEFT ARM   Final   Special Requests BOTTLES DRAWN AEROBIC AND ANAEROBIC 5CC   Final   Culture  Setup Time     Final   Value: 09/25/2013 10:11     Performed at [facility]   Culture     Final   Value:        BLOOD CULTURE RECEIVED NO GROWTH TO DATE CULTURE WILL BE HELD FOR 5 DAYS BEFORE ISSUING A FINAL NEGATIVE REPORT     Performed at [facility]   Report Status PENDING   Incomplete     Labs: Basic Metabolic Panel:  Recent Labs Lab 09/25/13 0035 09/25/13 0814 09/26/13 0450  NA 132* 131* 135  K 3.6 3.9 3.3*  CL 96 97 103  CO2 25 21 24   GLUCOSE 165* 190* 102*  BUN 13 9 8   CREATININE 0.53 0.51 0.52  CALCIUM 9.5 8.7 7.6*  Liver Function Tests:  Recent Labs Lab 09/25/13 0035 09/25/13 0814  AST 18 25  ALT 20 22  ALKPHOS 89 79  BILITOT 0.4 0.6  PROT 7.8 6.9  ALBUMIN 3.6 3.1*    Recent Labs Lab 09/25/13 0035 09/25/13 0814  LIPASE 67* 20   No results found for this basename: AMMONIA,  in the last 168 hours CBC:  Recent Labs Lab 09/25/13 0035 09/25/13 0814 09/26/13 0450  WBC 26.4* 22.0* 10.3  NEUTROABS 21.1* 18.6*  --   HGB 11.4* 10.1* 9.3*  HCT 35.0* 30.9* 29.1*  MCV 79.0 79.0 79.9  PLT 413* 356 310   Cardiac Enzymes: No results found for this basename: CKTOTAL, CKMB, CKMBINDEX, TROPONINI,  in the last 168 hours BNP: BNP (last 3 results) No results found for this basename: PROBNP,  in the last 8760 hours CBG:  Recent Labs Lab 09/26/13 1658 09/26/13 2037 09/26/13 2355 09/27/13 0444 09/27/13 0742  GLUCAP 82 139* 177* 103* 125*    Time coordinating discharge: 45 minutes  Signed:  Calistro Rauf,  Kalyiah Saintil  [facility] 09/27/2013, 12:09 PM

## 2013-09-27 NOTE — Progress Notes (Signed)
Pt left facility at 1315 this day with her spouse at her side.  Pt alert, oriented and without c/o on discharge. Discharge instructions/prescriptions given/explained with pt verbalizing understanding.  Need for followup appt with PCP encouraged.

## 2013-09-28 LAB — FOLATE RBC: RBC Folate: 1133 ng/mL — ABNORMAL HIGH (ref >=366)

## 2013-10-01 LAB — CULTURE, BLOOD (ROUTINE X 2)
Culture: NO GROWTH
Culture: NO GROWTH

## 2014-01-21 ENCOUNTER — Encounter: Payer: Self-pay | Admitting: Neurology

## 2014-01-25 ENCOUNTER — Ambulatory Visit (INDEPENDENT_AMBULATORY_CARE_PROVIDER_SITE_OTHER): Payer: BC Managed Care – PPO | Admitting: Neurology

## 2014-01-25 ENCOUNTER — Encounter: Payer: Self-pay | Admitting: Neurology

## 2014-01-25 VITALS — BP 135/78 | HR 89 | Ht 64.5 in | Wt 154.0 lb

## 2014-01-25 DIAGNOSIS — G43019 Migraine without aura, intractable, without status migrainosus: Secondary | ICD-10-CM

## 2014-01-25 HISTORY — DX: Migraine without aura, intractable, without status migrainosus: G43.019

## 2014-01-25 NOTE — Progress Notes (Signed)
Reason for visit: Migraine headache, fibromyalgia  Heather Wagner is a 38 y.o. female  History of present illness:  Ms. Heather Wagner is a 38 year old white female with a history of migraine headaches since 2002. The patient indicates that her headaches are generally menstrual migraine, but the patient will have almost daily headaches associated with neck discomfort and occipital pain. The patient reports frequent bifrontal headaches that she terms as sinus headaches. The patient has been on a multitude of medications including nortriptyline, amitriptyline, Cymbalta, and Topamax without tolerance. The patient indicates that she also has irritable bowel syndrome and a panic disorder. The patient has generalized neuromuscular pain in the neck, shoulders, back, and legs. The patient does have some issues with sleeping. The patient has no definite incontinence of bowel and bladder. The patient reports no true weakness of the extremities. The patient denies any numbness of the extremities. The patient has a history of chronic hereditary pancreatitis. The patient has diabetes associated with this. The patient comes to this office for an evaluation.  Past Medical History  Diagnosis Date  . Pancreatitis chronic   . Asthma   . Diabetes mellitus   . Fibromyalgia   . Migraines   . Hyperlipidemia   . IBS (irritable bowel syndrome)   . Chronic sinusitis   . Migraine without aura, with intractable migraine, so stated, without mention of status migrainosus 01/25/2014    Past Surgical History  Procedure Laterality Date  . Whiple    . Breast reduction surgery    . Dilation and curettage of uterus    . Whipple's      Family History  Problem Relation Age of Onset  . Diabetes Other   . Hypertension Other   . Cancer Other   . Pancreatitis Mother   . Pancreatitis Maternal Uncle   . Migraines Sister     Social history:  reports that she has never smoked. She has never used smokeless tobacco.  She reports that she does not drink alcohol or use illicit drugs.  Medications:  Current Outpatient Prescriptions on File Prior to Visit  Medication Sig Dispense Refill  . albuterol (PROVENTIL HFA;VENTOLIN HFA) 108 (90 BASE) MCG/ACT inhaler Inhale 2 puffs into the lungs every 6 (six) hours as needed. Asthma      . beclomethasone (QVAR) 80 MCG/ACT inhaler Inhale 1 puff into the lungs as needed.       . calcium carbonate (TUMS EX) 750 MG chewable tablet Chew 2-4 tablets by mouth as needed. No more than 10 a day      . carboxymethylcellulose 1 % ophthalmic solution 1-2 drops every 2 (two) hours as needed. Dry eyes      . HYDROcodone-acetaminophen (VICODIN) 5-500 MG per tablet Take 1 tablet by mouth every 6 (six) hours as needed. Pain      . ibuprofen (ADVIL,MOTRIN) 200 MG tablet Take 400 mg by mouth every 6 (six) hours as needed. pain      . insulin aspart (NOVOLOG) 100 UNIT/ML injection Inject into the skin 3 (three) times daily before meals. 1 unit per 10 carb for levels over 150      . insulin glargine (LANTUS) 100 UNIT/ML injection Inject 17 Units into the skin 2 (two) times daily.       . mometasone-formoterol (DULERA) 100-5 MCG/ACT AERO Inhale 2 puffs into the lungs 2 (two) times daily.      Marland Kitchen omeprazole (PRILOSEC) 20 MG capsule Take 20 mg by mouth daily.      Marland Kitchen  OVER THE COUNTER MEDICATION See admin instructions. Calcium Magnesium complex 1000mg -500mg  2 tablets with a meal      . Pancrelipase, Lip-Prot-Amyl, (ZENPEP PO) Take 2 capsules by mouth 3 (three) times daily. [id number]      . polyethylene glycol (MIRALAX / GLYCOLAX) packet Take 17 g by mouth daily as needed.  30 each  0  . promethazine (PHENERGAN) 25 MG suppository Place 25 mg rectally every 6 (six) hours as needed. Nausea when not tolerating by mouth      . promethazine (PHENERGAN) 25 MG tablet Take 25 mg by mouth every 6 (six) hours as needed. Nausea      . rizatriptan (MAXALT) 10 MG tablet Take 10 mg by mouth as needed. May  repeat in 2 hours if needed for headaches      . saccharomyces boulardii (FLORASTOR) 250 MG capsule Take 250 mg by mouth as needed.        No current facility-administered medications on file prior to visit.      Allergies  Allergen Reactions  . Nortriptyline Hcl Shortness Of Breath    Swelling anxiety  . Amitriptyline Other (See Comments)    sedation  . Amoxicillin Nausea And Vomiting  . Compazine [Prochlorperazine Maleate] Other (See Comments)    Neurological - uncontrollable eye movements  . Cymbalta [Duloxetine Hcl] Nausea And Vomiting    insomnia  . Doxycycline Nausea And Vomiting  . Flagyl [Metronidazole Hcl] Nausea And Vomiting    Skin rash  . Penicillins Cross Reactors Nausea And Vomiting    Skin rash/hives  . Topamax [Topiramate] Other (See Comments)    Mood change, anxiety, headaches  . Inapsine [Droperidol] Nausea And Vomiting  . Lyrica [Pregabalin]   . Phenothiazines   . Primaxin [Imipenem] Nausea And Vomiting  . Prozac [Fluoxetine Hcl] Anxiety  . Reglan [Metoclopramide Hcl] Anxiety    ROS:  Out of a complete 14 system review of symptoms, the patient complains only of the following symptoms, and all other reviewed systems are negative.  Ringing in the ears Shortness of breath Diarrhea, constipation Anemia, easy bruising Feeling hot, cold, flushing Jaw pain, achy muscles Allergies, skin sensitivity Headache Anxiety, decreased energy Insomnia  Blood pressure 135/78, pulse 89, height 5' 4.5" (1.638 m), weight 154 lb (69.854 kg).  Physical Exam  General: The patient is alert and cooperative at the time of the examination.  Eyes: Pupils are equal, round, and reactive to light. Discs are flat bilaterally.  Neck: The neck is supple, no carotid bruits are noted.  Respiratory: The respiratory examination is clear.  Cardiovascular: The cardiovascular examination reveals a regular rate and rhythm, no obvious murmurs or rubs are noted.  Neuromuscular: The  patient has excellent range movement of the cervical spine.  Skin: Extremities are without significant edema.  Neurologic Exam  Mental status: The patient is alert and oriented x 3 at the time of the examination. The patient has apparent normal recent and remote memory, with an apparently normal attention span and concentration ability.  Cranial nerves: Facial symmetry is present. There is good sensation of the face to pinprick and soft touch bilaterally. The strength of the facial muscles and the muscles to head turning and shoulder shrug are normal bilaterally. Speech is well enunciated, no aphasia or dysarthria is noted. Extraocular movements are full. Visual fields are full. The tongue is midline, and the patient has symmetric elevation of the soft palate. No obvious hearing deficits are noted.  Motor: The motor testing reveals 5 over 5  strength of all 4 extremities. Good symmetric motor tone is noted throughout.  Sensory: Sensory testing is intact to pinprick, soft touch, vibration sensation, and position sense on all 4 extremities. No evidence of extinction is noted.  Coordination: Cerebellar testing reveals good finger-nose-finger and heel-to-shin bilaterally.  Gait and station: Gait is normal. Tandem gait is normal. Romberg is negative. No drift is seen.  Reflexes: Deep tendon reflexes are symmetric and normal bilaterally. Toes are downgoing bilaterally.   Assessment/Plan:  1. Migraine headache  2. Fibromyalgia  3. Anxiety disorder, panic disorder  4. Irritable bowel syndrome  The patient reports frequent, daily headaches. The patient has multiple medication intolerances. The patient will be sent for CT of the head to evaluate her sinuses. If this is unremarkable, the majority of her frontal headaches likely her migraine. The patient may be considered for Botox injections in the future. The patient will be seen a rheumatologist, Dr. Corliss Skains, for possible fibromyalgia.  Marlan Palau MD 01/25/2014 2:56 PM  [facility] [address] [place], [place] [address]  Phone [phone] Fax [phone]

## 2014-01-25 NOTE — Patient Instructions (Signed)
Migraine Headache A migraine headache is an intense, throbbing pain on one or both sides of your head. A migraine can last for 30 minutes to several hours. CAUSES  The exact cause of a migraine headache is not always known. However, a migraine may be caused when nerves in the brain become irritated and release chemicals that cause inflammation. This causes pain. Certain things may also trigger migraines, such as:  Alcohol.  Smoking.  Stress.  Menstruation.  Aged cheeses.  Foods or drinks that contain nitrates, glutamate, aspartame, or tyramine.  Lack of sleep.  Chocolate.  Caffeine.  Hunger.  Physical exertion.  Fatigue.  Medicines used to treat chest pain (nitroglycerine), birth control pills, estrogen, and some blood pressure medicines. SIGNS AND SYMPTOMS  Pain on one or both sides of your head.  Pulsating or throbbing pain.  Severe pain that prevents daily activities.  Pain that is aggravated by any physical activity.  Nausea, vomiting, or both.  Dizziness.  Pain with exposure to bright lights, loud noises, or activity.  General sensitivity to bright lights, loud noises, or smells. Before you get a migraine, you may get warning signs that a migraine is coming (aura). An aura may include:  Seeing flashing lights.  Seeing bright spots, halos, or zig-zag lines.  Having tunnel vision or blurred vision.  Having feelings of numbness or tingling.  Having trouble talking.  Having muscle weakness. DIAGNOSIS  A migraine headache is often diagnosed based on:  Symptoms.  Physical exam.  A CT scan or MRI of your head. These imaging tests cannot diagnose migraines, but they can help rule out other causes of headaches. TREATMENT Medicines may be given for pain and nausea. Medicines can also be given to help prevent recurrent migraines.  HOME CARE INSTRUCTIONS  Only take over-the-counter or prescription medicines for pain or discomfort as directed by your  health care provider. The use of long-term narcotics is not recommended.  Lie down in a dark, quiet room when you have a migraine.  Keep a journal to find out what may trigger your migraine headaches. For example, write down:  What you eat and drink.  How much sleep you get.  Any change to your diet or medicines.  Limit alcohol consumption.  Quit smoking if you smoke.  Get 7 9 hours of sleep, or as recommended by your health care provider.  Limit stress.  Keep lights dim if bright lights bother you and make your migraines worse. SEEK IMMEDIATE MEDICAL CARE IF:   Your migraine becomes severe.  You have a fever.  You have a stiff neck.  You have vision loss.  You have muscular weakness or loss of muscle control.  You start losing your balance or have trouble walking.  You feel faint or pass out.  You have severe symptoms that are different from your first symptoms. MAKE SURE YOU:   Understand these instructions.  Will watch your condition.  Will get help right away if you are not doing well or get worse. Document Released: 11/19/2005 Document Revised: 09/09/2013 Document Reviewed: 07/27/2013 Exit[employer] Patient Information 2014 [employer].  

## 2014-02-01 ENCOUNTER — Ambulatory Visit
Admission: RE | Admit: 2014-02-01 | Discharge: 2014-02-01 | Disposition: A | Discharge status: Home / Self Care | Payer: BC Managed Care – PPO | Source: Ambulatory Visit | Attending: Neurology | Admitting: Neurology

## 2014-02-01 ENCOUNTER — Telehealth: Payer: Self-pay | Admitting: Neurology

## 2014-02-01 DIAGNOSIS — R51 Headache: Secondary | ICD-10-CM

## 2014-02-01 DIAGNOSIS — G43019 Migraine without aura, intractable, without status migrainosus: Secondary | ICD-10-CM

## 2014-02-01 NOTE — Telephone Encounter (Signed)
I called patient. I discussed the CT head results with her. The CT scan of brain is normal, and there is no evidence of a sinus issues. The frontal headaches likely represent migraine. The frequency of the headache and the lack of tolerance or effectiveness of multiple medications in the past would make her a candidate for Botox injections. The patient wants to research this issue, and if she decides that she wants to pursue Botox, she will call our office.   CT head 02/01/2014:  Impression   Normal CT scan of the head without contrast

## 2014-02-05 ENCOUNTER — Other Ambulatory Visit: Payer: Self-pay | Admitting: Pain Medicine

## 2014-02-05 ENCOUNTER — Ambulatory Visit
Admission: RE | Admit: 2014-02-05 | Discharge: 2014-02-05 | Disposition: A | Discharge status: Home / Self Care | Payer: BC Managed Care – PPO | Source: Ambulatory Visit | Attending: Pain Medicine | Admitting: Pain Medicine

## 2014-02-05 DIAGNOSIS — M542 Cervicalgia: Secondary | ICD-10-CM

## 2014-04-23 ENCOUNTER — Other Ambulatory Visit: Payer: Self-pay | Admitting: *Deleted

## 2014-04-23 ENCOUNTER — Other Ambulatory Visit: Payer: Self-pay | Admitting: Gastroenterology

## 2014-04-23 DIAGNOSIS — K861 Other chronic pancreatitis: Secondary | ICD-10-CM

## 2014-05-08 ENCOUNTER — Inpatient Hospital Stay: Admission: RE | Admit: 2014-05-08 | Payer: BC Managed Care – PPO | Source: Ambulatory Visit

## 2014-06-12 ENCOUNTER — Inpatient Hospital Stay: Admission: RE | Admit: 2014-06-12 | Payer: BC Managed Care – PPO | Source: Ambulatory Visit

## 2014-06-18 ENCOUNTER — Emergency Department (HOSPITAL_COMMUNITY): Payer: Medicare Other

## 2014-06-18 ENCOUNTER — Emergency Department (HOSPITAL_COMMUNITY)
Admission: EM | Admit: 2014-06-18 | Discharge: 2014-06-18 | Disposition: A | Discharge status: Home / Self Care | Payer: BC Managed Care – PPO | Attending: Emergency Medicine | Admitting: Emergency Medicine

## 2014-06-18 ENCOUNTER — Encounter (HOSPITAL_COMMUNITY): Payer: Self-pay | Admitting: Emergency Medicine

## 2014-06-18 ENCOUNTER — Inpatient Hospital Stay (HOSPITAL_COMMUNITY)
Admission: EM | Admit: 2014-06-18 | Discharge: 2014-06-21 | DRG: 438 | Disposition: A | Discharge status: Home / Self Care | Payer: Medicare Other | Attending: Internal Medicine | Admitting: Internal Medicine

## 2014-06-18 DIAGNOSIS — G43019 Migraine without aura, intractable, without status migrainosus: Secondary | ICD-10-CM | POA: Insufficient documentation

## 2014-06-18 DIAGNOSIS — E860 Dehydration: Secondary | ICD-10-CM | POA: Diagnosis present

## 2014-06-18 DIAGNOSIS — R52 Pain, unspecified: Secondary | ICD-10-CM | POA: Insufficient documentation

## 2014-06-18 DIAGNOSIS — Z880 Allergy status to penicillin: Secondary | ICD-10-CM

## 2014-06-18 DIAGNOSIS — Z794 Long term (current) use of insulin: Secondary | ICD-10-CM | POA: Insufficient documentation

## 2014-06-18 DIAGNOSIS — IMO0001 Reserved for inherently not codable concepts without codable children: Secondary | ICD-10-CM | POA: Diagnosis present

## 2014-06-18 DIAGNOSIS — Z809 Family history of malignant neoplasm, unspecified: Secondary | ICD-10-CM

## 2014-06-18 DIAGNOSIS — Z79899 Other long term (current) drug therapy: Secondary | ICD-10-CM

## 2014-06-18 DIAGNOSIS — J45909 Unspecified asthma, uncomplicated: Secondary | ICD-10-CM | POA: Insufficient documentation

## 2014-06-18 DIAGNOSIS — K859 Acute pancreatitis without necrosis or infection, unspecified: Secondary | ICD-10-CM | POA: Diagnosis not present

## 2014-06-18 DIAGNOSIS — Z833 Family history of diabetes mellitus: Secondary | ICD-10-CM

## 2014-06-18 DIAGNOSIS — IMO0002 Reserved for concepts with insufficient information to code with codable children: Secondary | ICD-10-CM | POA: Insufficient documentation

## 2014-06-18 DIAGNOSIS — R109 Unspecified abdominal pain: Secondary | ICD-10-CM | POA: Diagnosis not present

## 2014-06-18 DIAGNOSIS — J690 Pneumonitis due to inhalation of food and vomit: Secondary | ICD-10-CM | POA: Diagnosis present

## 2014-06-18 DIAGNOSIS — D72829 Elevated white blood cell count, unspecified: Secondary | ICD-10-CM

## 2014-06-18 DIAGNOSIS — Z8249 Family history of ischemic heart disease and other diseases of the circulatory system: Secondary | ICD-10-CM

## 2014-06-18 DIAGNOSIS — Z888 Allergy status to other drugs, medicaments and biological substances status: Secondary | ICD-10-CM

## 2014-06-18 DIAGNOSIS — K861 Other chronic pancreatitis: Secondary | ICD-10-CM | POA: Insufficient documentation

## 2014-06-18 DIAGNOSIS — E785 Hyperlipidemia, unspecified: Secondary | ICD-10-CM | POA: Diagnosis present

## 2014-06-18 DIAGNOSIS — K589 Irritable bowel syndrome without diarrhea: Secondary | ICD-10-CM | POA: Diagnosis present

## 2014-06-18 DIAGNOSIS — Z3202 Encounter for pregnancy test, result negative: Secondary | ICD-10-CM | POA: Insufficient documentation

## 2014-06-18 DIAGNOSIS — E119 Type 2 diabetes mellitus without complications: Secondary | ICD-10-CM | POA: Insufficient documentation

## 2014-06-18 DIAGNOSIS — Z791 Long term (current) use of non-steroidal anti-inflammatories (NSAID): Secondary | ICD-10-CM

## 2014-06-18 DIAGNOSIS — E1369 Other specified diabetes mellitus with other specified complication: Secondary | ICD-10-CM

## 2014-06-18 DIAGNOSIS — Z8742 Personal history of other diseases of the female genital tract: Secondary | ICD-10-CM | POA: Insufficient documentation

## 2014-06-18 DIAGNOSIS — Z881 Allergy status to other antibiotic agents status: Secondary | ICD-10-CM

## 2014-06-18 DIAGNOSIS — Z90411 Acquired partial absence of pancreas: Secondary | ICD-10-CM

## 2014-06-18 DIAGNOSIS — E0865 Diabetes mellitus due to underlying condition with hyperglycemia: Secondary | ICD-10-CM

## 2014-06-18 DIAGNOSIS — J189 Pneumonia, unspecified organism: Secondary | ICD-10-CM | POA: Diagnosis present

## 2014-06-18 HISTORY — DX: Vulvar vestibulitis: N94.810

## 2014-06-18 LAB — LIPASE, BLOOD
Lipase: 92 U/L — ABNORMAL HIGH (ref 11–59)
Lipase: 32 U/L (ref 11–59)

## 2014-06-18 LAB — CBC
HCT: 34.1 % — ABNORMAL LOW (ref 36.0–46.0)
Hemoglobin: 10.8 g/dL — ABNORMAL LOW (ref 12.0–15.0)
MCH: 24 pg — ABNORMAL LOW (ref 26.0–34.0)
MCHC: 31.7 g/dL (ref 30.0–36.0)
MCV: 75.8 fL — ABNORMAL LOW (ref 78.0–100.0)
Platelets: 419 10*3/uL — ABNORMAL HIGH (ref 150–400)
RBC: 4.5 MIL/uL (ref 3.87–5.11)
RDW: 15.4 % (ref 11.5–15.5)
WBC: 17.7 10*3/uL — AB (ref 4.0–10.5)

## 2014-06-18 LAB — CBC WITH DIFFERENTIAL/PLATELET
BASOS ABS: 0 10*3/uL (ref 0.0–0.1)
BASOS PCT: 0 % (ref 0–1)
EOS ABS: 0.3 10*3/uL (ref 0.0–0.7)
EOS PCT: 2 % (ref 0–5)
HCT: 32.8 % — ABNORMAL LOW (ref 36.0–46.0)
Hemoglobin: 10.3 g/dL — ABNORMAL LOW (ref 12.0–15.0)
Lymphocytes Relative: 21 % (ref 12–46)
Lymphs Abs: 3.1 10*3/uL (ref 0.7–4.0)
MCH: 24 pg — AB (ref 26.0–34.0)
MCHC: 31.4 g/dL (ref 30.0–36.0)
MCV: 76.3 fL — ABNORMAL LOW (ref 78.0–100.0)
MONO ABS: 1.3 10*3/uL — AB (ref 0.1–1.0)
Monocytes Relative: 9 % (ref 3–12)
Neutro Abs: 10.1 10*3/uL — ABNORMAL HIGH (ref 1.7–7.7)
Neutrophils Relative %: 68 % (ref 43–77)
Platelets: 390 10*3/uL (ref 150–400)
RBC: 4.3 MIL/uL (ref 3.87–5.11)
RDW: 15.2 % (ref 11.5–15.5)
WBC: 14.7 10*3/uL — ABNORMAL HIGH (ref 4.0–10.5)

## 2014-06-18 LAB — URINE MICROSCOPIC-ADD ON

## 2014-06-18 LAB — URINALYSIS, ROUTINE W REFLEX MICROSCOPIC
BILIRUBIN URINE: NEGATIVE
Glucose, UA: >1000 mg/dL — AB
Ketones, ur: NEGATIVE mg/dL
Leukocytes, UA: NEGATIVE
NITRITE: NEGATIVE
Protein, ur: NEGATIVE mg/dL
Specific Gravity, Urine: 1.031 — ABNORMAL HIGH (ref 1.005–1.030)
Urobilinogen, UA: 0.2 mg/dL (ref 0.0–1.0)
pH: 6 (ref 5.0–8.0)

## 2014-06-18 LAB — COMPREHENSIVE METABOLIC PANEL
ALBUMIN: 3.5 g/dL (ref 3.5–5.2)
ALBUMIN: 3.5 g/dL (ref 3.5–5.2)
ALK PHOS: 89 U/L (ref 39–117)
ALT: 15 U/L (ref 0–35)
ALT: 18 U/L (ref 0–35)
ANION GAP: 11 (ref 5–15)
AST: 16 U/L (ref 0–37)
AST: 18 U/L (ref 0–37)
Alkaline Phosphatase: 77 U/L (ref 39–117)
Anion gap: 16 — ABNORMAL HIGH (ref 5–15)
BILIRUBIN TOTAL: 0.4 mg/dL (ref 0.3–1.2)
BUN: 14 mg/dL (ref 6–23)
BUN: 8 mg/dL (ref 6–23)
CALCIUM: 9.3 mg/dL (ref 8.4–10.5)
CO2: 24 mEq/L (ref 19–32)
CO2: 22 mEq/L (ref 19–32)
Calcium: 9.3 mg/dL (ref 8.4–10.5)
Chloride: 98 mEq/L (ref 96–112)
Chloride: 96 mEq/L (ref 96–112)
Creatinine, Ser: 0.48 mg/dL — ABNORMAL LOW (ref 0.50–1.10)
Creatinine, Ser: 0.47 mg/dL — ABNORMAL LOW (ref 0.50–1.10)
GFR calc Af Amer: >90 mL/min (ref >90)
GFR calc Af Amer: >90 mL/min (ref >90)
GFR calc non Af Amer: >90 mL/min (ref >90)
GFR calc non Af Amer: >90 mL/min (ref >90)
Glucose, Bld: 240 mg/dL — ABNORMAL HIGH (ref 70–99)
Glucose, Bld: 235 mg/dL — ABNORMAL HIGH (ref 70–99)
Potassium: 3.7 mEq/L (ref 3.7–5.3)
Potassium: 3.8 mEq/L (ref 3.7–5.3)
Sodium: 133 mEq/L — ABNORMAL LOW (ref 137–147)
Sodium: 134 mEq/L — ABNORMAL LOW (ref 137–147)
TOTAL PROTEIN: 7.5 g/dL (ref 6.0–8.3)
TOTAL PROTEIN: 7.7 g/dL (ref 6.0–8.3)
Total Bilirubin: 0.9 mg/dL (ref 0.3–1.2)

## 2014-06-18 LAB — GLUCOSE, CAPILLARY: Glucose-Capillary: 159 mg/dL — ABNORMAL HIGH (ref 70–99)

## 2014-06-18 LAB — POC URINE PREG, ED: Preg Test, Ur: NEGATIVE

## 2014-06-18 MED ORDER — DIPHENHYDRAMINE HCL 50 MG/ML IJ SOLN: 12.5000 mg | Freq: Four times a day (QID) | INTRAMUSCULAR | Status: DC | PRN

## 2014-06-18 MED ORDER — HYDROMORPHONE HCL PF 1 MG/ML IJ SOLN
1.0000 mg | Freq: Once | INTRAMUSCULAR | Status: AC
  Administered 2014-06-18: 1 mg via INTRAVENOUS
  Filled 2014-06-18: qty 1

## 2014-06-18 MED ORDER — IBUPROFEN 800 MG PO TABS
400.0000 mg | ORAL_TABLET | Freq: Four times a day (QID) | ORAL | Status: DC | PRN
  Administered 2014-06-20 – 2014-06-21 (×2): 400 mg via ORAL
  Filled 2014-06-18 (×4): qty 1

## 2014-06-18 MED ORDER — SODIUM CHLORIDE 0.9 % IV BOLUS (SEPSIS)
1000.0000 mL | Freq: Once | INTRAVENOUS | Status: AC
  Administered 2014-06-18: 1000 mL via INTRAVENOUS

## 2014-06-18 MED ORDER — SODIUM CHLORIDE 0.9 % IV SOLN
INTRAVENOUS | Status: DC
  Administered 2014-06-19 – 2014-06-20 (×4): via INTRAVENOUS

## 2014-06-18 MED ORDER — DIPHENHYDRAMINE HCL 12.5 MG/5ML PO ELIX: 12.5000 mg | ORAL_SOLUTION | Freq: Four times a day (QID) | ORAL | Status: DC | PRN

## 2014-06-18 MED ORDER — HYDROMORPHONE HCL 4 MG PO TABS: 4.0000 mg | ORAL_TABLET | ORAL | Status: DC | PRN

## 2014-06-18 MED ORDER — INSULIN GLARGINE 100 UNIT/ML ~~LOC~~ SOLN
9.0000 [IU] | Freq: Two times a day (BID) | SUBCUTANEOUS | Status: DC
  Administered 2014-06-20 – 2014-06-21 (×2): 9 [IU] via SUBCUTANEOUS
  Filled 2014-06-18 (×7): qty 0.09

## 2014-06-18 MED ORDER — ALBUTEROL SULFATE HFA 108 (90 BASE) MCG/ACT IN AERS: 2.0000 | INHALATION_SPRAY | Freq: Four times a day (QID) | RESPIRATORY_TRACT | Status: DC | PRN

## 2014-06-18 MED ORDER — PROMETHAZINE HCL 25 MG/ML IJ SOLN
25.0000 mg | Freq: Once | INTRAMUSCULAR | Status: AC
  Administered 2014-06-18: 25 mg via INTRAVENOUS
  Filled 2014-06-18: qty 1

## 2014-06-18 MED ORDER — FLUTICASONE PROPIONATE HFA 44 MCG/ACT IN AERO
1.0000 | INHALATION_SPRAY | Freq: Two times a day (BID) | RESPIRATORY_TRACT | Status: DC
  Filled 2014-06-18: qty 10.6

## 2014-06-18 MED ORDER — NALOXONE HCL 0.4 MG/ML IJ SOLN: 0.4000 mg | INTRAMUSCULAR | Status: DC | PRN

## 2014-06-18 MED ORDER — ONDANSETRON HCL 4 MG/2ML IJ SOLN: 4.0000 mg | Freq: Once | INTRAMUSCULAR | Status: DC

## 2014-06-18 MED ORDER — ONDANSETRON HCL 4 MG/2ML IJ SOLN
4.0000 mg | Freq: Once | INTRAMUSCULAR | Status: AC
  Administered 2014-06-18: 4 mg via INTRAVENOUS
  Filled 2014-06-18: qty 2

## 2014-06-18 MED ORDER — ALBUTEROL SULFATE (2.5 MG/3ML) 0.083% IN NEBU
2.5000 mg | INHALATION_SOLUTION | Freq: Four times a day (QID) | RESPIRATORY_TRACT | Status: DC | PRN
  Administered 2014-06-19 – 2014-06-20 (×2): 2.5 mg via RESPIRATORY_TRACT
  Filled 2014-06-18 (×2): qty 3

## 2014-06-18 MED ORDER — SODIUM CHLORIDE 0.9 % IJ SOLN: 9.0000 mL | INTRAMUSCULAR | Status: DC | PRN

## 2014-06-18 MED ORDER — HEPARIN SODIUM (PORCINE) 5000 UNIT/ML IJ SOLN
5000.0000 [IU] | Freq: Three times a day (TID) | INTRAMUSCULAR | Status: DC
  Administered 2014-06-19 – 2014-06-20 (×5): 5000 [IU] via SUBCUTANEOUS
  Filled 2014-06-18 (×8): qty 1

## 2014-06-18 MED ORDER — HYDROMORPHONE 0.3 MG/ML IV SOLN
INTRAVENOUS | Status: DC
  Administered 2014-06-18: via INTRAVENOUS
  Administered 2014-06-19: 5 mg via INTRAVENOUS
  Administered 2014-06-19: 5.99 mg via INTRAVENOUS
  Administered 2014-06-19: 1.8 mg via INTRAVENOUS
  Administered 2014-06-19: 08:00:00 via INTRAVENOUS
  Filled 2014-06-18 (×2): qty 25

## 2014-06-18 MED ORDER — MELATONIN 5 MG PO TABS: 1.0000 | ORAL_TABLET | Freq: Every day | ORAL | Status: DC

## 2014-06-18 MED ORDER — INSULIN ASPART 100 UNIT/ML ~~LOC~~ SOLN
0.0000 [IU] | SUBCUTANEOUS | Status: DC
  Administered 2014-06-19 – 2014-06-20 (×3): 2 [IU] via SUBCUTANEOUS
  Administered 2014-06-20: 5 [IU] via SUBCUTANEOUS
  Administered 2014-06-20: 2 [IU] via SUBCUTANEOUS
  Administered 2014-06-20: 1 [IU] via SUBCUTANEOUS
  Administered 2014-06-21 (×2): 2 [IU] via SUBCUTANEOUS

## 2014-06-18 MED ORDER — POLYETHYLENE GLYCOL 3350 17 G PO PACK
17.0000 g | PACK | Freq: Every day | ORAL | Status: DC | PRN
  Filled 2014-06-18: qty 1

## 2014-06-18 MED ORDER — IOHEXOL 300 MG/ML  SOLN
100.0000 mL | Freq: Once | INTRAMUSCULAR | Status: AC | PRN
  Administered 2014-06-18: 100 mL via INTRAVENOUS

## 2014-06-18 MED ORDER — IOHEXOL 300 MG/ML  SOLN
25.0000 mL | Freq: Once | INTRAMUSCULAR | Status: AC | PRN
  Administered 2014-06-18: 25 mL via ORAL

## 2014-06-18 MED ORDER — ONDANSETRON HCL 4 MG/2ML IJ SOLN
4.0000 mg | Freq: Four times a day (QID) | INTRAMUSCULAR | Status: DC | PRN
  Administered 2014-06-20: 4 mg via INTRAVENOUS
  Filled 2014-06-18: qty 2

## 2014-06-18 MED ORDER — PROMETHAZINE HCL 25 MG PO TABS: 25.0000 mg | ORAL_TABLET | Freq: Four times a day (QID) | ORAL | Status: DC | PRN

## 2014-06-18 MED ORDER — PROMETHAZINE HCL 25 MG/ML IJ SOLN
12.5000 mg | Freq: Four times a day (QID) | INTRAMUSCULAR | Status: DC | PRN
  Administered 2014-06-19 (×2): 12.5 mg via INTRAVENOUS
  Filled 2014-06-18 (×2): qty 1

## 2014-06-18 MED ORDER — PANTOPRAZOLE SODIUM 40 MG PO TBEC
40.0000 mg | DELAYED_RELEASE_TABLET | Freq: Every day | ORAL | Status: DC
  Filled 2014-06-18: qty 1

## 2014-06-18 NOTE — Progress Notes (Signed)
PHARMACIST - PHYSICIAN ORDER COMMUNICATION  CONCERNING: P&T Medication Policy on Herbal Medications  DESCRIPTION:  This patient's order for:  melatonin has been noted.  This product(s) is classified as an "herbal" or natural product. Due to a lack of definitive safety studies or FDA approval, nonstandard manufacturing practices, plus the potential risk of unknown drug-drug interactions while on inpatient medications, the Pharmacy and Therapeutics Committee does not permit the use of "herbal" or natural products of this type within [facility].   ACTION TAKEN: The pharmacy department is unable to verify this order at this time and your patient has been informed of this safety policy. Please reevaluate patient's clinical condition at discharge and address if the herbal or natural product(s) should be resumed at that time.  Charolotte Ekeom Ansley Stanwood, PharmD, pager [phone]. 06/18/2014,10:15 PM.

## 2014-06-18 NOTE — ED Notes (Signed)
MD at bedside. 

## 2014-06-18 NOTE — ED Notes (Signed)
Patient resting in position of comfort with eyes closed RR WNL--even and unlabored with equal rise and fall of chest Patient in NAD Side rails up, call bell in reach  

## 2014-06-18 NOTE — ED Notes (Signed)
ED PA at bedside

## 2014-06-18 NOTE — ED Provider Notes (Signed)
CSN: [id number]     Arrival date & time 06/18/14  0006 History   First MD Initiated Contact with Patient 06/18/14 0140     Chief Complaint  Patient presents with  . Pancreatitis     (Consider location/radiation/quality/duration/timing/severity/associated sxs/prior Treatment) HPI This is a 38 year old female with a history of chronic pancreatitis status post subtotal pancreatectomy. She is here with epigastric abdominal pain that began yesterday afternoon. The onset was gradual but became severe. It was not relieved with her usual hydrocodone and Phenergan at home so she came to the ED for IV medication. She has had similar exacerbations in the past but has not required ED visits frequently nor has she been admitted frequently. The pain has been associated with nausea but no vomiting or diarrhea. Pain is worse with movement or palpation. She rates it a 9/10 currently.    Past Medical History  Diagnosis Date  . Pancreatitis chronic   . Asthma   . Diabetes mellitus   . Fibromyalgia   . Migraines   . Hyperlipidemia   . IBS (irritable bowel syndrome)   . Chronic sinusitis   . Migraine without aura, with intractable migraine, so stated, without mention of status migrainosus 01/25/2014  . Vulvar vestibulitis    Past Surgical History  Procedure Laterality Date  . Whiple    . Breast reduction surgery    . Dilation and curettage of uterus    . Whipple's     Family History  Problem Relation Age of Onset  . Diabetes Other   . Hypertension Other   . Cancer Other   . Pancreatitis Mother   . Pancreatitis Maternal Uncle   . Migraines Sister    History  Substance Use Topics  . Smoking status: Never Smoker   . Smokeless tobacco: Never Used  . Alcohol Use: No   OB History   Grav Para Term Preterm Abortions TAB SAB Ect Mult Living                 Review of Systems  All other systems reviewed and are negative.   Allergies  Nortriptyline hcl; Amitriptyline; Amoxicillin;  Compazine; Cymbalta; Dexilant; Doxycycline; Flagyl; Penicillins cross reactors; Topamax; Inapsine; Ketamine; Lyrica; Phenothiazines; Primaxin; Prozac; and Reglan  Home Medications   Prior to Admission medications   Medication Sig Start Date End Date Taking? Authorizing Provider  albuterol (PROVENTIL HFA;VENTOLIN HFA) 108 (90 BASE) MCG/ACT inhaler Inhale 2 puffs into the lungs every 6 (six) hours as needed. Asthma   Yes Historical Provider, MD  beclomethasone (QVAR) 80 MCG/ACT inhaler Inhale 1 puff into the lungs as needed.    Yes Historical Provider, MD  calcium carbonate (TUMS EX) 750 MG chewable tablet Chew 2-4 tablets by mouth as needed. No more than 10 a day   Yes Historical Provider, MD  carboxymethylcellulose 1 % ophthalmic solution 1-2 drops every 2 (two) hours as needed. Dry eyes   Yes Historical Provider, MD  fentaNYL (DURAGESIC - DOSED MCG/HR) 12 MCG/HR Place 12 mcg onto the skin daily. 01/20/14  Yes Historical Provider, MD  HYDROcodone-acetaminophen (NORCO/VICODIN) 5-325 MG per tablet Take 1 tablet by mouth every 6 (six) hours as needed for moderate pain.   Yes Historical Provider, MD  ibuprofen (ADVIL,MOTRIN) 200 MG tablet Take 400 mg by mouth every 6 (six) hours as needed for moderate pain.    Yes Historical Provider, MD  insulin aspart (NOVOLOG) 100 UNIT/ML injection Inject into the skin 3 (three) times daily before meals. 1 unit per 10  carb for levels over 150   Yes Historical Provider, MD  insulin glargine (LANTUS) 100 UNIT/ML injection Inject 18 Units into the skin 2 (two) times daily.    Yes Historical Provider, MD  Melatonin 5 MG TABS Take 1 tablet by mouth at bedtime.   Yes Historical Provider, MD  omeprazole (PRILOSEC) 20 MG capsule Take 20 mg by mouth daily.   Yes Historical Provider, MD  OVER THE COUNTER MEDICATION See admin instructions. Calcium Magnesium complex 1000mg -500mg  2 tablets with a meal   Yes Historical Provider, MD  Pancrelipase, Lip-Prot-Amyl, (ZENPEP PO) Take 3  capsules by mouth 3 (three) times daily. 1 capsule with snacks.   Yes Historical Provider, MD  polyethylene glycol (MIRALAX / GLYCOLAX) packet Take 17 g by mouth daily as needed for mild constipation.   Yes Historical Provider, MD  promethazine (PHENERGAN) 25 MG tablet Take 25 mg by mouth every 6 (six) hours as needed. Nausea   Yes Historical Provider, MD  rizatriptan (MAXALT) 10 MG tablet Take 10 mg by mouth as needed for migraine. May repeat in 2 hours if needed for headaches   Yes Historical Provider, MD  BAYER CONTOUR NEXT TEST test strip 1 each by Other route daily. 12/04/13   Historical Provider, MD   BP 133/83  Pulse 102  Temp(Src) 98.6 F (37 C) (Oral)  Resp 18  SpO2 100%  LMP 06/16/2014  Physical Exam General: Well-developed, thin female in no acute distress; appearance consistent with age of record HENT: normocephalic; atraumatic Eyes: pupils equal, round and reactive to light; extraocular muscles intact Neck: supple Heart: regular rate and rhythm; no murmurs, rubs or gallops Lungs: clear to auscultation bilaterally Abdomen: soft; nondistended; severe epigastric tenderness; bowel sounds present Extremities: No deformity; full range of motion; pulses normal Neurologic: Awake, alert and oriented; motor function intact in all extremities and symmetric; no facial droop Skin: Warm and dry Psychiatric: Normal mood and affect    ED Course  Procedures (including critical care time)  MDM   Nursing notes and vitals signs, including pulse oximetry, reviewed.  Summary of this visit's results, reviewed by myself:  Labs:  Results for orders placed during the hospital encounter of 06/18/14 (from the past 24 hour(s))  URINALYSIS, ROUTINE W REFLEX MICROSCOPIC     Status: Abnormal   Collection Time    06/18/14 12:38 AM      Result Value Ref Range   Color, Urine YELLOW  YELLOW   APPearance CLEAR  CLEAR   Specific Gravity, Urine 1.031 (*) 1.005 - 1.030   pH 6.0  5.0 - 8.0    Glucose, UA >1000 (*) NEGATIVE mg/dL   Hgb urine dipstick LARGE (*) NEGATIVE   Bilirubin Urine NEGATIVE  NEGATIVE   Ketones, ur NEGATIVE  NEGATIVE mg/dL   Protein, ur NEGATIVE  NEGATIVE mg/dL   Urobilinogen, UA 0.2  0.0 - 1.0 mg/dL   Nitrite NEGATIVE  NEGATIVE   Leukocytes, UA NEGATIVE  NEGATIVE  URINE MICROSCOPIC-ADD ON     Status: None   Collection Time    06/18/14 12:38 AM      Result Value Ref Range   Squamous Epithelial / LPF RARE  RARE   WBC, UA 0-2  <3 WBC/hpf   RBC / HPF TOO NUMEROUS TO COUNT  <3 RBC/hpf   Bacteria, UA RARE  RARE  POC URINE PREG, ED     Status: None   Collection Time    06/18/14 12:41 AM      Result Value Ref  Range   Preg Test, Ur NEGATIVE  NEGATIVE  CBC WITH DIFFERENTIAL     Status: Abnormal   Collection Time    06/18/14 12:49 AM      Result Value Ref Range   WBC 14.7 (*) 4.0 - 10.5 K/uL   RBC 4.30  3.87 - 5.11 MIL/uL   Hemoglobin 10.3 (*) 12.0 - 15.0 g/dL   HCT 04.532.8 (*) 40.936.0 - 81.146.0 %   MCV 76.3 (*) 78.0 - 100.0 fL   MCH 24.0 (*) 26.0 - 34.0 pg   MCHC 31.4  30.0 - 36.0 g/dL   RDW 91.415.2  78.211.5 - 95.615.5 %   Platelets 390  150 - 400 K/uL   Neutrophils Relative % 68  43 - 77 %   Neutro Abs 10.1 (*) 1.7 - 7.7 K/uL   Lymphocytes Relative 21  12 - 46 %   Lymphs Abs 3.1  0.7 - 4.0 K/uL   Monocytes Relative 9  3 - 12 %   Monocytes Absolute 1.3 (*) 0.1 - 1.0 K/uL   Eosinophils Relative 2  0 - 5 %   Eosinophils Absolute 0.3  0.0 - 0.7 K/uL   Basophils Relative 0  0 - 1 %   Basophils Absolute 0.0  0.0 - 0.1 K/uL  COMPREHENSIVE METABOLIC PANEL     Status: Abnormal   Collection Time    06/18/14 12:49 AM      Result Value Ref Range   Sodium 133 (*) 137 - 147 mEq/L   Potassium 3.7  3.7 - 5.3 mEq/L   Chloride 98  96 - 112 mEq/L   CO2 24  19 - 32 mEq/L   Glucose, Bld 240 (*) 70 - 99 mg/dL   BUN 14  6 - 23 mg/dL   Creatinine, Ser 2.130.48 (*) 0.50 - 1.10 mg/dL   Calcium 9.3  8.4 - 08.610.5 mg/dL   Total Protein 7.5  6.0 - 8.3 g/dL   Albumin 3.5  3.5 - 5.2 g/dL    AST 16  0 - 37 U/L   ALT 15  0 - 35 U/L   Alkaline Phosphatase 77  39 - 117 U/L   Total Bilirubin 0.4  0.3 - 1.2 mg/dL   GFR calc non Af Amer >90  >90 mL/min   GFR calc Af Amer >90  >90 mL/min   Anion gap 11  5 - 15  LIPASE, BLOOD     Status: Abnormal   Collection Time    06/18/14 12:49 AM      Result Value Ref Range   Lipase 92 (*) 11 - 59 U/L   6:20 AM The patient's pain is reasonably well-controlled at this time. She states this is one of the worst exacerbation she has had. She is under a pain management contract the pain management clinic has been very forthcoming about this. She does not wish to compromise her pain management arrangement. She understands that we are limited as to what we can do in terms of outpatient treatment. She will contact her pain management physician later today the   Hanley SeamenJohn L Earnesteen Birnie, MD 06/18/14 [phone]

## 2014-06-18 NOTE — ED Notes (Signed)
Pt c/o abdominal pain from pancreatitis. Pt states she was seen this morning for the same thing. Pt took dilaudid at 1400 and Phenergan at 1600. She states she feels much worse. Pt alert, no acute distress. Skin cool, clammy.

## 2014-06-18 NOTE — ED Provider Notes (Signed)
Medical screening examination/treatment/procedure(s) were performed by non-physician practitioner and as supervising physician I was immediately available for consultation/collaboration.   EKG Interpretation None        Kristen N Ward, DO 06/18/14 2301 

## 2014-06-18 NOTE — H&P (Signed)
[facility] History and Physical  Heather FlingRebekah Wagner ZOX:[id number] DOB: 09-23-1976 DOA: 06/18/2014  Referring physician: EDP PCP: Orland PenmanJARALLA SHAMLEFFER, IBTEHAL, MD   Chief Complaint: Abdominalpain   HPI: Heather Wagner is a 38 y.o. female with h/o hereditary chronic pancreatitis, presents to ED for 2nd time today with uncontrollable abdominal pain.  She states this is the worst her abdominal pain has been in years and usually her pain is controlled at home with vicodin.  Pain not helped by PO dilaudid and phenergan at home so she returned to ED.  2 episodes of vomiting so far today.  Vomiting NBNB.  [facility] (See GI note from May).  Review of Systems: Systems reviewed.  As above, otherwise negative  Past Medical History  Diagnosis Date  . Pancreatitis chronic   . Asthma   . Diabetes mellitus   . Fibromyalgia   . Migraines   . Hyperlipidemia   . IBS (irritable bowel syndrome)   . Chronic sinusitis   . Migraine without aura, with intractable migraine, so stated, without mention of status migrainosus 01/25/2014  . Vulvar vestibulitis    Past Surgical History  Procedure Laterality Date  . Whiple    . Breast reduction surgery    . Dilation and curettage of uterus    . Whipple's     Social History:  reports that she has never smoked. She has never used smokeless tobacco. She reports that she does not drink alcohol or use illicit drugs.  Allergies  Allergen Reactions  . Nortriptyline Hcl Shortness Of Breath    Swelling anxiety  . Amitriptyline Other (See Comments)    sedation  . Amoxicillin Nausea And Vomiting  . Compazine [Prochlorperazine Maleate] Other (See Comments)    Neurological - uncontrollable eye movements  . Cymbalta [Duloxetine Hcl] Nausea And Vomiting    insomnia  . Dexilant [Dexlansoprazole] Nausea Only  . Doxycycline Nausea And Vomiting  . Flagyl [Metronidazole Hcl] Nausea And Vomiting    Skin rash  .  Penicillins Cross Reactors Nausea And Vomiting    Skin rash/hives  . Topamax [Topiramate] Other (See Comments)    Mood change, anxiety, headaches  . Inapsine [Droperidol] Nausea And Vomiting  . Ketamine Anxiety    Agitation, insomnia   . Lyrica [Pregabalin] Other (See Comments)    Mood changes- become listless, disinterested  . Phenothiazines Hives, Nausea And Vomiting and Rash  . Primaxin [Imipenem] Nausea And Vomiting  . Prozac [Fluoxetine Hcl] Anxiety  . Reglan [Metoclopramide Hcl] Anxiety    Family History  Problem Relation Age of Onset  . Diabetes Other   . Hypertension Other   . Cancer Other   . Pancreatitis Mother   . Pancreatitis Maternal Uncle   . Migraines Sister      Prior to Admission medications   Medication Sig Start Date End Date Taking? Authorizing Provider  albuterol (PROVENTIL HFA;VENTOLIN HFA) 108 (90 BASE) MCG/ACT inhaler Inhale 2 puffs into the lungs every 6 (six) hours as needed. Asthma   Yes Historical Provider, MD  beclomethasone (QVAR) 80 MCG/ACT inhaler Inhale 1 puff into the lungs as needed.    Yes Historical Provider, MD  calcium carbonate (TUMS EX) 750 MG chewable tablet Chew 2-4 tablets by mouth as needed. No more than 10 a day   Yes Historical Provider, MD  carboxymethylcellulose 1 % ophthalmic solution 1-2 drops every 2 (two) hours as needed. Dry eyes   Yes Historical Provider, MD  HYDROcodone-acetaminophen (NORCO/VICODIN) 5-325 MG per  tablet Take 1 tablet by mouth every 6 (six) hours as needed for moderate pain.   Yes Historical Provider, MD  HYDROmorphone (DILAUDID) 4 MG tablet Take 1 tablet (4 mg total) by mouth every 4 (four) hours as needed (for pain, until you can contact your pain management clinic). 06/18/14  Yes John L Molpus, MD  ibuprofen (ADVIL,MOTRIN) 200 MG tablet Take 400 mg by mouth every 6 (six) hours as needed for moderate pain.    Yes Historical Provider, MD  insulin aspart (NOVOLOG) 100 UNIT/ML injection Inject into the skin 3  (three) times daily before meals. 1 unit per 10 carb for levels over 150   Yes Historical Provider, MD  insulin glargine (LANTUS) 100 UNIT/ML injection Inject 18 Units into the skin 2 (two) times daily.    Yes Historical Provider, MD  Melatonin 5 MG TABS Take 1 tablet by mouth at bedtime.   Yes Historical Provider, MD  omeprazole (PRILOSEC) 20 MG capsule Take 20 mg by mouth daily.   Yes Historical Provider, MD  OVER THE COUNTER MEDICATION See admin instructions. Calcium Magnesium complex 1000mg -500mg  2 tablets with a meal   Yes Historical Provider, MD  Pancrelipase, Lip-Prot-Amyl, (ZENPEP PO) Take 3 capsules by mouth 3 (three) times daily. 1 capsule with snacks.   Yes Historical Provider, MD  polyethylene glycol (MIRALAX / GLYCOLAX) packet Take 17 g by mouth daily as needed for mild constipation.   Yes Historical Provider, MD  promethazine (PHENERGAN) 25 MG tablet Take 25 mg by mouth every 6 (six) hours as needed. Nausea   Yes Historical Provider, MD  rizatriptan (MAXALT) 10 MG tablet Take 10 mg by mouth as needed for migraine. May repeat in 2 hours if needed for headaches   Yes Historical Provider, MD  fentaNYL (DURAGESIC - DOSED MCG/HR) 12 MCG/HR Place 12 mcg onto the skin daily. 01/20/14   Historical Provider, MD   Physical Exam: Filed Vitals:   06/18/14 2000  BP: 151/89  Pulse: 101  Temp:   Resp:     BP 151/89  Pulse 101  Temp(Src) 98.5 F (36.9 C) (Oral)  Resp 16  SpO2 98%  LMP 06/16/2014  General Appearance:    Alert, oriented, no distress, appears stated age  Head:    Normocephalic, atraumatic  Eyes:    PERRL, EOMI, sclera non-icteric        Nose:   Nares without drainage or epistaxis. Mucosa, turbinates normal  Throat:   Moist mucous membranes. Oropharynx without erythema or exudate.  Neck:   Supple. No carotid bruits.  No thyromegaly.  No lymphadenopathy.   Back:     No CVA tenderness, no spinal tenderness  Lungs:     Clear to auscultation bilaterally, without wheezes,  rhonchi or rales  Chest wall:    No tenderness to palpitation  Heart:    Regular rate and rhythm without murmurs, gallops, rubs  Abdomen:     Soft, non-tender, nondistended, normal bowel sounds, no organomegaly  Genitalia:    deferred  Rectal:    deferred  Extremities:   No clubbing, cyanosis or edema.  Pulses:   2+ and symmetric all extremities  Skin:   Skin color, texture, turgor normal, no rashes or lesions  Lymph nodes:   Cervical, supraclavicular, and axillary nodes normal  Neurologic:   CNII-XII intact. Normal strength, sensation and reflexes      throughout    Labs on Admission:  Basic Metabolic Panel:  Recent Labs Lab 06/18/14 0049 06/18/14 1841  NA  133* 134*  K 3.7 3.8  CL 98 96  CO2 24 22  GLUCOSE 240* 235*  BUN 14 8  CREATININE 0.48* 0.47*  CALCIUM 9.3 9.3   Liver Function Tests:  Recent Labs Lab 06/18/14 0049 06/18/14 1841  AST 16 18  ALT 15 18  ALKPHOS 77 89  BILITOT 0.4 0.9  PROT 7.5 7.7  ALBUMIN 3.5 3.5    Recent Labs Lab 06/18/14 0049 06/18/14 1841  LIPASE 92* 32   No results found for this basename: AMMONIA,  in the last 168 hours CBC:  Recent Labs Lab 06/18/14 0049 06/18/14 1841  WBC 14.7* 17.7*  NEUTROABS 10.1*  --   HGB 10.3* 10.8*  HCT 32.8* 34.1*  MCV 76.3* 75.8*  PLT 390 419*   Cardiac Enzymes: No results found for this basename: CKTOTAL, CKMB, CKMBINDEX, TROPONINI,  in the last 168 hours  BNP (last 3 results) No results found for this basename: PROBNP,  in the last 8760 hours CBG: No results found for this basename: GLUCAP,  in the last 168 hours  Radiological Exams on Admission: Ct Abdomen Pelvis W Contrast  06/18/2014   CLINICAL DATA:  Abdominal pain.  Vomiting.  Recurrent pancreatitis.  EXAM: CT ABDOMEN AND PELVIS WITH CONTRAST  TECHNIQUE: Multidetector CT imaging of the abdomen and pelvis was performed using the standard protocol following bolus administration of intravenous contrast.  CONTRAST:  OMNIPAQUE  IOHEXOL 300 MG/ML  SOLN  COMPARISON:  09/25/2013  FINDINGS: Postop changes from previous partial pancreatectomy again demonstrated. Mild to moderate diffuse pancreatic edema and peripancreatic inflammatory changes are again demonstrated, with minimal change since previous study. Mild pancreatic ductal dilatation remains stable. No evidence of pancreatic pseudocysts.  Surgical clips from prior cholecystectomy noted. Pneumobilia again noted from prior choledocho jejunostomy. No liver masses are identified. The spleen, adrenal glands, and kidneys are normal in appearance. No evidence hydronephrosis.  A right-sided uterine fibroid is seen measuring 4.2 cm which is slightly increased in size from 3.4 cm previously. Adnexal regions are unremarkable in appearance. No other soft tissue masses or lymphadenopathy identified. No evidence of bowel obstruction no suspicious bone lesions identified.  IMPRESSION: Mild to moderate acute on chronic pancreatitis, without significant change compared with prior exam.  No evidence of pancreatic pseudocysts or other complication.  Uterine fibroids.   Electronically Signed   By: Myles Rosenthal M.D.   On: 06/18/2014 21:12    EKG: Independently reviewed.  Assessment/Plan Active Problems:   Acute pancreatitis   Diabetes mellitus   Chronic pancreatitis   1. Acute on chronic pancreatitis - hold home narcotics, NPO, IVF, and putting patient on PCA for pain control since dilaudid not working well for her in ED.  Continue phenergan PRN.  Repeat CBC in AM to monitor leukocytosis. 2. IDDM - cut home lantus down to 9 units q12h from her baseline 18 q12h and putting patient on med dose SSI Q4H while NPO.   Code Status: Full  Family Communication: Husband at bedside Disposition Plan: Admit to obs   Time spent: 50 min  Troyce Gieske M. [facility] Pager [phone]  If 7AM-7PM, please contact the day team taking care of the patient [email or web address] Password [facility] 06/18/2014, 10:13  PM

## 2014-06-18 NOTE — ED Notes (Signed)
Pt states she has LT hx of pancreatitis and began having a flare-up last night. Pt states she took Vicodin and phenergan at home but it did not provide any relief.

## 2014-06-18 NOTE — ED Notes (Signed)
Patient to receive one more dose of pain medication prior to DC Per patient, her husband will be provided transportation home but does not get off of work until after 0700

## 2014-06-18 NOTE — ED Provider Notes (Signed)
CSN: [id number]     Arrival date & time 06/18/14  1758 History   First MD Initiated Contact with Patient 06/18/14 1809     Chief Complaint  Patient presents with  . Abdominal Pain  . Emesis     (Consider location/radiation/quality/duration/timing/severity/associated sxs/prior Treatment) HPI Comments: 38 year old female with a past medical history of chronic pancreatitis, asthma, diabetes, fibromyalgia, migraines, hyperlipidemia, IBS and migraines presents to the emergency department with her husband complaining of continued abdominal pain after being seen earlier today around 12:00 AM. Patient states her pain began yesterday morning and gradually increased throughout the day, located mid epigastric, described as sharp, worse with certain movements, slightly relieved by laying in the fetal position. When she was seen in the emergency department, she states her pain only slightly resolved from a 10 out of 10 to 7/10, at home she tried taking a Dilaudid pill and Phenergan with minimal relief. Admits to associated nausea with 2 episodes of emesis occurring earlier today and about 15 minutes prior to arrival. Emesis non-bloody, non-bilious. States the last time she was in the emergency department the sides earlier today was in October 2014 when she was admitted. 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 She is seen by [facility] gastroenterology. States she has not had severe pain lke this with her pancreatitis in a while. Hx of partial pancreatectomy in 1997. Denies fever, chills, constipation or diarrhea, CP or SOB.  Patient is a 38 y.o. female presenting with abdominal pain and vomiting. The history is provided by the patient.  Abdominal Pain Associated symptoms: nausea and vomiting   Emesis Associated symptoms: abdominal pain     Past Medical History  Diagnosis Date  . Pancreatitis chronic   . Asthma   . Diabetes mellitus   .  Fibromyalgia   . Migraines   . Hyperlipidemia   . IBS (irritable bowel syndrome)   . Chronic sinusitis   . Migraine without aura, with intractable migraine, so stated, without mention of status migrainosus 01/25/2014  . Vulvar vestibulitis    Past Surgical History  Procedure Laterality Date  . Whiple    . Breast reduction surgery    . Dilation and curettage of uterus    . Whipple's     Family History  Problem Relation Age of Onset  . Diabetes Other   . Hypertension Other   . Cancer Other   . Pancreatitis Mother   . Pancreatitis Maternal Uncle   . Migraines Sister    History  Substance Use Topics  . Smoking status: Never Smoker   . Smokeless tobacco: Never Used  . Alcohol Use: No   OB History   Grav Para Term Preterm Abortions TAB SAB Ect Mult Living                 Review of Systems  Gastrointestinal: Positive for nausea, vomiting and abdominal pain.  All other systems reviewed and are negative.     Allergies  Nortriptyline hcl; Amitriptyline; Amoxicillin; Compazine; Cymbalta; Dexilant; Doxycycline; Flagyl; Penicillins cross reactors; Topamax; Inapsine; Ketamine; Lyrica; Phenothiazines; Primaxin; Prozac; and Reglan  Home Medications   Prior to Admission medications   Medication Sig Start Date End Date Taking? Authorizing Provider  albuterol (PROVENTIL HFA;VENTOLIN HFA) 108 (90 BASE) MCG/ACT inhaler Inhale 2 puffs into the lungs every 6 (six) hours as needed. Asthma   Yes Historical Provider, MD  beclomethasone (QVAR) 80 MCG/ACT inhaler Inhale 1 puff into the lungs  as needed.    Yes Historical Provider, MD  calcium carbonate (TUMS EX) 750 MG chewable tablet Chew 2-4 tablets by mouth as needed. No more than 10 a day   Yes Historical Provider, MD  carboxymethylcellulose 1 % ophthalmic solution 1-2 drops every 2 (two) hours as needed. Dry eyes   Yes Historical Provider, MD  HYDROcodone-acetaminophen (NORCO/VICODIN) 5-325 MG per tablet Take 1 tablet by mouth every 6  (six) hours as needed for moderate pain.   Yes Historical Provider, MD  HYDROmorphone (DILAUDID) 4 MG tablet Take 1 tablet (4 mg total) by mouth every 4 (four) hours as needed (for pain, until you can contact your pain management clinic). 06/18/14  Yes John L Molpus, MD  ibuprofen (ADVIL,MOTRIN) 200 MG tablet Take 400 mg by mouth every 6 (six) hours as needed for moderate pain.    Yes Historical Provider, MD  insulin aspart (NOVOLOG) 100 UNIT/ML injection Inject into the skin 3 (three) times daily before meals. 1 unit per 10 carb for levels over 150   Yes Historical Provider, MD  insulin glargine (LANTUS) 100 UNIT/ML injection Inject 18 Units into the skin 2 (two) times daily.    Yes Historical Provider, MD  Melatonin 5 MG TABS Take 1 tablet by mouth at bedtime.   Yes Historical Provider, MD  omeprazole (PRILOSEC) 20 MG capsule Take 20 mg by mouth daily.   Yes Historical Provider, MD  OVER THE COUNTER MEDICATION See admin instructions. Calcium Magnesium complex 1000mg -500mg  2 tablets with a meal   Yes Historical Provider, MD  Pancrelipase, Lip-Prot-Amyl, (ZENPEP PO) Take 3 capsules by mouth 3 (three) times daily. 1 capsule with snacks.   Yes Historical Provider, MD  polyethylene glycol (MIRALAX / GLYCOLAX) packet Take 17 g by mouth daily as needed for mild constipation.   Yes Historical Provider, MD  promethazine (PHENERGAN) 25 MG tablet Take 25 mg by mouth every 6 (six) hours as needed. Nausea   Yes Historical Provider, MD  rizatriptan (MAXALT) 10 MG tablet Take 10 mg by mouth as needed for migraine. May repeat in 2 hours if needed for headaches   Yes Historical Provider, MD  fentaNYL (DURAGESIC - DOSED MCG/HR) 12 MCG/HR Place 12 mcg onto the skin daily. 01/20/14   Historical Provider, MD   BP 151/89  Pulse 101  Temp(Src) 98.5 F (36.9 C) (Oral)  Resp 16  SpO2 98%  LMP 06/16/2014 Physical Exam  Nursing note and vitals reviewed. Constitutional: She is oriented to person, place, and time. She  appears well-developed and well-nourished. She appears distressed (mildly).  Laying on exam bed in fetal position.  HENT:  Head: Normocephalic and atraumatic.  Mouth/Throat: Oropharynx is clear and moist.  Eyes: Conjunctivae are normal. No scleral icterus.  Neck: Normal range of motion. Neck supple.  Cardiovascular: Regular rhythm and normal heart sounds.  Tachycardia present.   Tachy ~100.  Pulmonary/Chest: Effort normal and breath sounds normal.  Abdominal: Soft. Normal appearance and bowel sounds are normal. She exhibits no distension. There is tenderness in the epigastric area. There is guarding. There is no rigidity and no rebound.  No peritoneal signs.  Musculoskeletal: Normal range of motion. She exhibits no edema.  Neurological: She is alert and oriented to person, place, and time.  Skin: Skin is warm. She is diaphoretic.  Psychiatric: She has a normal mood and affect. Her behavior is normal.    ED Course  Procedures (including critical care time) Labs Review Labs Reviewed  CBC - Abnormal; Notable for the  following:    WBC 17.7 (*)    Hemoglobin 10.8 (*)    HCT 34.1 (*)    MCV 75.8 (*)    MCH 24.0 (*)    Platelets 419 (*)    All other components within normal limits  COMPREHENSIVE METABOLIC PANEL - Abnormal; Notable for the following:    Sodium 134 (*)    Glucose, Bld 235 (*)    Creatinine, Ser 0.47 (*)    Anion gap 16 (*)    All other components within normal limits  LIPASE, BLOOD    Imaging Review Ct Abdomen Pelvis W Contrast  06/18/2014   CLINICAL DATA:  Abdominal pain.  Vomiting.  Recurrent pancreatitis.  EXAM: CT ABDOMEN AND PELVIS WITH CONTRAST  TECHNIQUE: Multidetector CT imaging of the abdomen and pelvis was performed using the standard protocol following bolus administration of intravenous contrast.  CONTRAST:  100mL OMNIPAQUE IOHEXOL 300 MG/ML  SOLN  COMPARISON:  09/25/2013  FINDINGS: Postop changes from previous partial pancreatectomy again demonstrated.  Mild to moderate diffuse pancreatic edema and peripancreatic inflammatory changes are again demonstrated, with minimal change since previous study. Mild pancreatic ductal dilatation remains stable. No evidence of pancreatic pseudocysts.  Surgical clips from prior cholecystectomy noted. Pneumobilia again noted from prior choledocho jejunostomy. No liver masses are identified. The spleen, adrenal glands, and kidneys are normal in appearance. No evidence hydronephrosis.  A right-sided uterine fibroid is seen measuring 4.2 cm which is slightly increased in size from 3.4 cm previously. Adnexal regions are unremarkable in appearance. No other soft tissue masses or lymphadenopathy identified. No evidence of bowel obstruction no suspicious bone lesions identified.  IMPRESSION: Mild to moderate acute on chronic pancreatitis, without significant change compared with prior exam.  No evidence of pancreatic pseudocysts or other complication.  Uterine fibroids.   Electronically Signed   By: Myles RosenthalJohn  Stahl M.D.   On: 06/18/2014 21:12     EKG Interpretation None      MDM   Final diagnoses:  Acute pancreatitis, unspecified pancreatitis type  Other chronic pancreatitis   Pt presenting back to the ED with mid-epigastric abdominal pain. Hx of chronic pancreatitis. She is not frequently seen in ED for the same. She is diaphoretic,slightly tachycardic, vitals otherwise stable. It is noted earlier today she had a leukocytosis of 14.7 and a lipase of 92. Plan to repeat labs, control pain and obtain CT abd/pelvis. Last CT 09/2013 showing Partial pancreatectomy with pancreatic ductal dilatation and peripancreatic edema consistent with acute pancreatitis. Diffuse fatty infiltration of the liver. Biliary gas is likely postoperative.  10:14 PM CT scan showing mild to moderate acute on chronic pancreatitis without significant change compared to prior exam. Leukocytosis of 17.7. The lipase within normal limits. Liver functions within  normal limits. Patient reports her pain has only minimally improved. States she is still very uncomfortable, pain 9/10. Will admit for observation for pain control. Admission accepted by Dr[facility] Julian ReilGardner, [facility].  Trevor MaceRobyn M Albert, PA-C 06/18/14 2215

## 2014-06-19 ENCOUNTER — Observation Stay (HOSPITAL_COMMUNITY): Payer: Medicare Other

## 2014-06-19 DIAGNOSIS — E119 Type 2 diabetes mellitus without complications: Secondary | ICD-10-CM | POA: Diagnosis present

## 2014-06-19 DIAGNOSIS — Z833 Family history of diabetes mellitus: Secondary | ICD-10-CM | POA: Diagnosis not present

## 2014-06-19 DIAGNOSIS — J189 Pneumonia, unspecified organism: Secondary | ICD-10-CM | POA: Diagnosis not present

## 2014-06-19 DIAGNOSIS — K589 Irritable bowel syndrome without diarrhea: Secondary | ICD-10-CM | POA: Diagnosis present

## 2014-06-19 DIAGNOSIS — E785 Hyperlipidemia, unspecified: Secondary | ICD-10-CM | POA: Diagnosis present

## 2014-06-19 DIAGNOSIS — R109 Unspecified abdominal pain: Secondary | ICD-10-CM | POA: Diagnosis present

## 2014-06-19 DIAGNOSIS — K861 Other chronic pancreatitis: Secondary | ICD-10-CM | POA: Diagnosis present

## 2014-06-19 DIAGNOSIS — IMO0001 Reserved for inherently not codable concepts without codable children: Secondary | ICD-10-CM | POA: Diagnosis present

## 2014-06-19 DIAGNOSIS — J690 Pneumonitis due to inhalation of food and vomit: Secondary | ICD-10-CM | POA: Diagnosis not present

## 2014-06-19 DIAGNOSIS — Z794 Long term (current) use of insulin: Secondary | ICD-10-CM | POA: Diagnosis not present

## 2014-06-19 DIAGNOSIS — Z888 Allergy status to other drugs, medicaments and biological substances status: Secondary | ICD-10-CM | POA: Diagnosis not present

## 2014-06-19 DIAGNOSIS — Z8249 Family history of ischemic heart disease and other diseases of the circulatory system: Secondary | ICD-10-CM | POA: Diagnosis not present

## 2014-06-19 DIAGNOSIS — Z79899 Other long term (current) drug therapy: Secondary | ICD-10-CM | POA: Diagnosis not present

## 2014-06-19 DIAGNOSIS — Z880 Allergy status to penicillin: Secondary | ICD-10-CM | POA: Diagnosis not present

## 2014-06-19 DIAGNOSIS — E860 Dehydration: Secondary | ICD-10-CM | POA: Diagnosis present

## 2014-06-19 DIAGNOSIS — D72829 Elevated white blood cell count, unspecified: Secondary | ICD-10-CM | POA: Diagnosis not present

## 2014-06-19 DIAGNOSIS — Z881 Allergy status to other antibiotic agents status: Secondary | ICD-10-CM | POA: Diagnosis not present

## 2014-06-19 DIAGNOSIS — Z791 Long term (current) use of non-steroidal anti-inflammatories (NSAID): Secondary | ICD-10-CM | POA: Diagnosis not present

## 2014-06-19 DIAGNOSIS — Z809 Family history of malignant neoplasm, unspecified: Secondary | ICD-10-CM | POA: Diagnosis not present

## 2014-06-19 DIAGNOSIS — Z90411 Acquired partial absence of pancreas: Secondary | ICD-10-CM | POA: Diagnosis not present

## 2014-06-19 DIAGNOSIS — K859 Acute pancreatitis without necrosis or infection, unspecified: Secondary | ICD-10-CM | POA: Diagnosis not present

## 2014-06-19 DIAGNOSIS — J45909 Unspecified asthma, uncomplicated: Secondary | ICD-10-CM | POA: Diagnosis present

## 2014-06-19 LAB — GLUCOSE, CAPILLARY
GLUCOSE-CAPILLARY: 140 mg/dL — AB (ref 70–99)
GLUCOSE-CAPILLARY: 94 mg/dL (ref 70–99)
Glucose-Capillary: 100 mg/dL — ABNORMAL HIGH (ref 70–99)
Glucose-Capillary: 108 mg/dL — ABNORMAL HIGH (ref 70–99)
Glucose-Capillary: 121 mg/dL — ABNORMAL HIGH (ref 70–99)

## 2014-06-19 LAB — CBC
HEMATOCRIT: 29.4 % — AB (ref 36.0–46.0)
Hemoglobin: 9.4 g/dL — ABNORMAL LOW (ref 12.0–15.0)
MCH: 24.1 pg — ABNORMAL LOW (ref 26.0–34.0)
MCHC: 32 g/dL (ref 30.0–36.0)
MCV: 75.4 fL — AB (ref 78.0–100.0)
Platelets: 390 10*3/uL (ref 150–400)
RBC: 3.9 MIL/uL (ref 3.87–5.11)
RDW: 15.6 % — ABNORMAL HIGH (ref 11.5–15.5)
WBC: 16.5 10*3/uL — ABNORMAL HIGH (ref 4.0–10.5)

## 2014-06-19 LAB — BASIC METABOLIC PANEL
Anion gap: 11 (ref 5–15)
BUN: 6 mg/dL (ref 6–23)
CO2: 25 meq/L (ref 19–32)
CREATININE: 0.51 mg/dL (ref 0.50–1.10)
Calcium: 8.2 mg/dL — ABNORMAL LOW (ref 8.4–10.5)
Chloride: 101 mEq/L (ref 96–112)
GFR calc non Af Amer: >90 mL/min (ref >90)
GLUCOSE: 131 mg/dL — AB (ref 70–99)
Potassium: 3.5 mEq/L — ABNORMAL LOW (ref 3.7–5.3)
Sodium: 137 mEq/L (ref 137–147)

## 2014-06-19 LAB — LIPASE, BLOOD: LIPASE: 19 U/L (ref 11–59)

## 2014-06-19 LAB — MAGNESIUM: MAGNESIUM: 1.6 mg/dL (ref 1.5–2.5)

## 2014-06-19 LAB — STREP PNEUMONIAE URINARY ANTIGEN: Strep Pneumo Urinary Antigen: NEGATIVE

## 2014-06-19 MED ORDER — ACETAMINOPHEN 325 MG PO TABS: 650.0000 mg | ORAL_TABLET | ORAL | Status: DC | PRN

## 2014-06-19 MED ORDER — HYDROMORPHONE HCL PF 2 MG/ML IJ SOLN
2.0000 mg | Freq: Once | INTRAMUSCULAR | Status: AC
  Administered 2014-06-19: 2 mg via INTRAVENOUS
  Filled 2014-06-19: qty 1

## 2014-06-19 MED ORDER — HYDROMORPHONE 0.3 MG/ML IV SOLN: INTRAVENOUS | Status: DC

## 2014-06-19 MED ORDER — POTASSIUM CHLORIDE 10 MEQ/100ML IV SOLN
10.0000 meq | INTRAVENOUS | Status: AC
  Administered 2014-06-19 (×4): 10 meq via INTRAVENOUS
  Filled 2014-06-19 (×4): qty 100

## 2014-06-19 MED ORDER — MAGNESIUM SULFATE 50 % IJ SOLN
3.0000 g | Freq: Once | INTRAVENOUS | Status: AC
  Administered 2014-06-19: 3 g via INTRAVENOUS
  Filled 2014-06-19: qty 6

## 2014-06-19 MED ORDER — PANTOPRAZOLE SODIUM 40 MG IV SOLR
40.0000 mg | INTRAVENOUS | Status: DC
  Administered 2014-06-19 – 2014-06-20 (×2): 40 mg via INTRAVENOUS
  Filled 2014-06-19 (×3): qty 40

## 2014-06-19 MED ORDER — LEVOFLOXACIN IN D5W 750 MG/150ML IV SOLN
750.0000 mg | INTRAVENOUS | Status: DC
  Administered 2014-06-19: 750 mg via INTRAVENOUS
  Filled 2014-06-19 (×2): qty 150

## 2014-06-19 MED ORDER — HYDROMORPHONE HCL PF 2 MG/ML IJ SOLN
2.0000 mg | INTRAMUSCULAR | Status: DC | PRN
  Administered 2014-06-19 – 2014-06-20 (×6): 2 mg via INTRAVENOUS
  Filled 2014-06-19 (×6): qty 1

## 2014-06-19 MED ORDER — ACETAMINOPHEN 325 MG RE SUPP
325.0000 mg | RECTAL | Status: DC | PRN
  Administered 2014-06-19: 325 mg via RECTAL
  Filled 2014-06-19 (×2): qty 1

## 2014-06-19 MED ORDER — LORAZEPAM 2 MG/ML IJ SOLN
1.0000 mg | Freq: Once | INTRAMUSCULAR | Status: AC
  Administered 2014-06-19: 1 mg via INTRAVENOUS
  Filled 2014-06-19: qty 1

## 2014-06-19 MED ORDER — SODIUM CHLORIDE 0.9 % IV BOLUS (SEPSIS)
2000.0000 mL | Freq: Once | INTRAVENOUS | Status: AC
  Administered 2014-06-19: 2000 mL via INTRAVENOUS

## 2014-06-19 MED ORDER — PROMETHAZINE HCL 25 MG/ML IJ SOLN
12.5000 mg | Freq: Four times a day (QID) | INTRAMUSCULAR | Status: DC | PRN
  Administered 2014-06-19 – 2014-06-20 (×4): 25 mg via INTRAVENOUS
  Filled 2014-06-19 (×4): qty 1

## 2014-06-19 MED ORDER — HYDROMORPHONE 0.3 MG/ML IV SOLN
INTRAVENOUS | Status: DC
  Administered 2014-06-19: 5.91 mg via INTRAVENOUS
  Administered 2014-06-19: 18:00:00 via INTRAVENOUS
  Administered 2014-06-20: 6 mg via INTRAVENOUS
  Administered 2014-06-20: 02:00:00 via INTRAVENOUS
  Administered 2014-06-20: 2.1 mg via INTRAVENOUS
  Filled 2014-06-19 (×2): qty 25

## 2014-06-19 NOTE — Progress Notes (Signed)
[facility] PROGRESS NOTE  Heather FlingRebekah Wagner JXB:[id number] DOB: [birth year] DOA: 06/18/2014 PCP: Heather Wagner, IBTEHAL, MD  Assessment/Plan: #1 acute on chronic pancreatitis Questionable etiology. Patient with abdominal pain. Patient with no history of alcohol abuse. Lipase levels trending down. 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 Continue IV pain management, give a bolus of 2 L normal saline and increase IV fluids to 150 cc an hour. Continue anti-medics. Bowel rest. Follow.  #2 community-acquired pneumonia versus aspiration pneumonia Per chest x-ray. Patient with fever. Your strep pneumococcus antigen negative. Urine Legionella antigen pending. Will start patient on empiric IV Levaquin. Supportive care.  #3 leukocytosis Likely secondary to problem #1. Urinalysis is nitrite negative leukocytes negative. Check blood cultures x2. Patient has been started on empiric IV Levaquin.  #4 dehydration IV fluids.  #5 diabetes mellitus CBGs have ranged from 131- 235. Check a hemoglobin A1c. Continue half home dose of Lantus. Sliding scale.  #6 prophylaxis PPI for GI prophylaxis. Heparin for DVT prophylaxis.   Code Status: Full Family Communication: Updated patient no family at bedside. Disposition Plan: Home when medically stable   Consultants:  None  Procedures:  Chest x-ray 06/19/2014  CT abdomen and pelvis 06/18/2014  Antibiotics:  IV Levaquin 06/19/2014  HPI/Subjective: Patient still with complaints of abdominal pain. Patient also complains of nausea.  Objective: Filed Vitals:   06/19/14 1550  BP:   Pulse:   Temp:   Resp: 14    Intake/Output Summary (Last 24 hours) at 06/19/14 1702 Last data filed at 06/19/14 1200  Gross per 24 hour  Intake 1191.67 ml  Output      0 ml  Net 1191.67 ml   Filed Weights   06/18/14 2333  Weight: 72.2 kg (159 lb 2.8 oz)    Exam:   General:  NAD  Cardiovascular:  RRR  Respiratory: CTAB  Abdomen: Soft, tender to palpation in the epigastrium and left upper quadrant, positive bowel sounds, no rebound, no guarding.  Musculoskeletal: No clubbing cyanosis or edema  Data Reviewed: Basic Metabolic Panel:  Recent Labs Lab 06/18/14 0049 06/18/14 1841 06/19/14 0528  NA 133* 134* 137  K 3.7 3.8 3.5*  CL 98 96 101  CO2 24 22 25   GLUCOSE 240* 235* 131*  BUN 14 8 6   CREATININE 0.48* 0.47* 0.51  CALCIUM 9.3 9.3 8.2*  MG  --   --  1.6   Liver Function Tests:  Recent Labs Lab 06/18/14 0049 06/18/14 1841  AST 16 18  ALT 15 18  ALKPHOS 77 89  BILITOT 0.4 0.9  PROT 7.5 7.7  ALBUMIN 3.5 3.5    Recent Labs Lab 06/18/14 0049 06/18/14 1841 06/19/14 0528  LIPASE 92* 32 19   No results found for this basename: AMMONIA,  in the last 168 hours CBC:  Recent Labs Lab 06/18/14 0049 06/18/14 1841 06/19/14 0528  WBC 14.7* 17.7* 16.5*  NEUTROABS 10.1*  --   --   HGB 10.3* 10.8* 9.4*  HCT 32.8* 34.1* 29.4*  MCV 76.3* 75.8* 75.4*  PLT 390 419* 390   Cardiac Enzymes: No results found for this basename: CKTOTAL, CKMB, CKMBINDEX, TROPONINI,  in the last 168 hours BNP (last 3 results) No results found for this basename: PROBNP,  in the last 8760 hours CBG:  Recent Labs Lab 06/18/14 2341 06/19/14 0406 06/19/14 0732 06/19/14 1128 06/19/14 1612  GLUCAP 159* 140* 94 100* 108*    No results found for this or any previous visit (from  the past 240 hour(s)).   Studies: Dg Chest 2 View  06/19/2014   CLINICAL DATA:  Shortness of breath. Leukocytosis. Current history of diabetes and asthma.  EXAM: CHEST  2 VIEW  COMPARISON:  CTA chest 07/01/2010.  Two-view chest x-ray 06/30/2010.  FINDINGS: AP semi-erect and lateral images were obtained. Streaky and patchy airspace opacities in the right lower lobe. Linear atelectasis in the right middle lobe. Lungs otherwise clear. Cardiomediastinal silhouette unremarkable and unchanged. Visualized bony thorax  intact.  IMPRESSION: 1. Right lower lobe pneumonia. 2. Linear atelectasis in the right middle lobe.   Electronically Signed   By: Heather Wagner M.D.   On: 06/19/2014 09:15   Ct Abdomen Pelvis W Contrast  06/18/2014   CLINICAL DATA:  Abdominal pain.  Vomiting.  Recurrent pancreatitis.  EXAM: CT ABDOMEN AND PELVIS WITH CONTRAST  TECHNIQUE: Multidetector CT imaging of the abdomen and pelvis was performed using the standard protocol following bolus administration of intravenous contrast.  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 Mild to moderate diffuse pancreatic edema and peripancreatic inflammatory changes are again demonstrated, with minimal change since previous study. Mild pancreatic ductal dilatation remains stable. No evidence of pancreatic pseudocysts.  Surgical clips from prior cholecystectomy noted. Pneumobilia again noted from prior choledocho jejunostomy. No liver masses are identified. The spleen, adrenal glands, and kidneys are normal in appearance. No evidence hydronephrosis.  A right-sided uterine fibroid is seen measuring 4.2 cm which is slightly increased in size from 3.4 cm previously. Adnexal regions are unremarkable in appearance. No other soft tissue masses or lymphadenopathy identified. No evidence of bowel obstruction no suspicious bone lesions identified.  IMPRESSION: Mild to moderate acute on chronic pancreatitis, without significant change compared with prior exam.  No evidence of pancreatic pseudocysts or other complication.  Uterine fibroids.   Electronically Signed   By: Heather Wagner M.D.   On: 06/18/2014 21:12    Scheduled Meds: . fluticasone  1 puff Inhalation BID  . heparin  5,000 Units Subcutaneous 3 times per day  . HYDROmorphone PCA 0.3 mg/mL   Intravenous 6 times per day  . insulin aspart  0-15 Units Subcutaneous 6 times per day  . insulin glargine  9  Units Subcutaneous BID  . levofloxacin (LEVAQUIN) IV  750 mg Intravenous Q24H  . magnesium sulfate 1 - 4 g bolus IVPB  3 g Intravenous Once  . ondansetron (ZOFRAN) IV  4 mg Intravenous Once  . pantoprazole (PROTONIX) IV  40 mg Intravenous Q24H   Continuous Infusions: . sodium chloride 150 mL/hr at 06/19/14 1610    Principal Problem:   Acute pancreatitis Active Problems:   CAP (community acquired pneumonia)   Diabetes mellitus   Chronic pancreatitis    Time spent: 35 mins    [facility] MD [facility] Pager [phone]. If 7PM-7AM, please contact night-coverage at [email or web address], password [facility] 06/19/2014, 5:02 PM  LOS: 1 day

## 2014-06-19 NOTE — Progress Notes (Signed)
On call MD notified - pt complains of 8/10 pain despite PCA therapy. Orders placed and carried out.

## 2014-06-20 ENCOUNTER — Inpatient Hospital Stay (HOSPITAL_COMMUNITY): Payer: Medicare Other

## 2014-06-20 LAB — CBC
HCT: 25.9 % — ABNORMAL LOW (ref 36.0–46.0)
HEMOGLOBIN: 8.2 g/dL — AB (ref 12.0–15.0)
MCH: 24.2 pg — ABNORMAL LOW (ref 26.0–34.0)
MCHC: 31.7 g/dL (ref 30.0–36.0)
MCV: 76.4 fL — AB (ref 78.0–100.0)
Platelets: 317 10*3/uL (ref 150–400)
RBC: 3.39 MIL/uL — AB (ref 3.87–5.11)
RDW: 15.8 % — ABNORMAL HIGH (ref 11.5–15.5)
WBC: 12.6 10*3/uL — ABNORMAL HIGH (ref 4.0–10.5)

## 2014-06-20 LAB — COMPREHENSIVE METABOLIC PANEL
ALBUMIN: 2.4 g/dL — AB (ref 3.5–5.2)
ALT: 17 U/L (ref 0–35)
AST: 19 U/L (ref 0–37)
Alkaline Phosphatase: 87 U/L (ref 39–117)
Anion gap: 10 (ref 5–15)
BUN: 5 mg/dL — ABNORMAL LOW (ref 6–23)
CALCIUM: 7.7 mg/dL — AB (ref 8.4–10.5)
CO2: 22 mEq/L (ref 19–32)
Chloride: 102 mEq/L (ref 96–112)
Creatinine, Ser: 0.58 mg/dL (ref 0.50–1.10)
GFR calc non Af Amer: >90 mL/min (ref >90)
Glucose, Bld: 109 mg/dL — ABNORMAL HIGH (ref 70–99)
Potassium: 4.7 mEq/L (ref 3.7–5.3)
SODIUM: 134 meq/L — AB (ref 137–147)
TOTAL PROTEIN: 6.2 g/dL (ref 6.0–8.3)
Total Bilirubin: 0.8 mg/dL (ref 0.3–1.2)

## 2014-06-20 LAB — GLUCOSE, CAPILLARY
GLUCOSE-CAPILLARY: 100 mg/dL — AB (ref 70–99)
GLUCOSE-CAPILLARY: 127 mg/dL — AB (ref 70–99)
GLUCOSE-CAPILLARY: 133 mg/dL — AB (ref 70–99)
GLUCOSE-CAPILLARY: 161 mg/dL — AB (ref 70–99)
Glucose-Capillary: 111 mg/dL — ABNORMAL HIGH (ref 70–99)
Glucose-Capillary: 219 mg/dL — ABNORMAL HIGH (ref 70–99)

## 2014-06-20 LAB — LEGIONELLA ANTIGEN, URINE: Legionella Antigen, Urine: NEGATIVE

## 2014-06-20 LAB — TSH: TSH: 1.06 u[IU]/mL (ref 0.350–4.500)

## 2014-06-20 LAB — TROPONIN I
Troponin I: <0.3 ng/mL (ref <0.30)
Troponin I: <0.3 ng/mL (ref <0.30)
Troponin I: <0.3 ng/mL (ref <0.30)

## 2014-06-20 LAB — LIPASE, BLOOD: LIPASE: 11 U/L (ref 11–59)

## 2014-06-20 MED ORDER — SUMATRIPTAN SUCCINATE 100 MG PO TABS
100.0000 mg | ORAL_TABLET | ORAL | Status: DC | PRN
Start: 2014-06-20 — End: 2014-06-21
  Filled 2014-06-20 (×2): qty 1

## 2014-06-20 MED ORDER — LEVALBUTEROL HCL 0.63 MG/3ML IN NEBU
0.6300 mg | INHALATION_SOLUTION | Freq: Four times a day (QID) | RESPIRATORY_TRACT | Status: DC | PRN
  Administered 2014-06-21: 0.63 mg via RESPIRATORY_TRACT
  Filled 2014-06-20: qty 3

## 2014-06-20 MED ORDER — HYDROCODONE-ACETAMINOPHEN 5-325 MG PO TABS
1.0000 | ORAL_TABLET | ORAL | Status: DC | PRN
  Administered 2014-06-21: 1 via ORAL
  Filled 2014-06-20 (×2): qty 1

## 2014-06-20 MED ORDER — ACETAMINOPHEN 650 MG RE SUPP
650.0000 mg | Freq: Once | RECTAL | Status: AC
  Administered 2014-06-20: 650 mg via RECTAL
  Filled 2014-06-20: qty 1

## 2014-06-20 MED ORDER — PANCRELIPASE (LIP-PROT-AMYL) 12000-38000 UNITS PO CPEP
2.0000 | ORAL_CAPSULE | Freq: Three times a day (TID) | ORAL | Status: DC
Start: 2014-06-20 — End: 2014-06-21
  Administered 2014-06-21 (×2): 2 via ORAL
  Filled 2014-06-20 (×5): qty 2

## 2014-06-20 MED ORDER — DEXTROSE 5 % IV SOLN
500.0000 mg | INTRAVENOUS | Status: DC
  Administered 2014-06-20: 500 mg via INTRAVENOUS
  Filled 2014-06-20 (×4): qty 500

## 2014-06-20 MED ORDER — ALBUTEROL SULFATE HFA 108 (90 BASE) MCG/ACT IN AERS: 2.0000 | INHALATION_SPRAY | Freq: Four times a day (QID) | RESPIRATORY_TRACT | Status: DC | PRN

## 2014-06-20 MED ORDER — ONDANSETRON 8 MG PO TBDP
8.0000 mg | ORAL_TABLET | Freq: Three times a day (TID) | ORAL | Status: DC | PRN
  Filled 2014-06-20: qty 1

## 2014-06-20 MED ORDER — RIZATRIPTAN BENZOATE 10 MG PO TABS
10.0000 mg | ORAL_TABLET | Freq: Once | ORAL | Status: AC | PRN
  Administered 2014-06-20: 10 mg via ORAL

## 2014-06-20 MED ORDER — PROMETHAZINE HCL 25 MG/ML IJ SOLN
25.0000 mg | Freq: Four times a day (QID) | INTRAMUSCULAR | Status: DC | PRN
  Administered 2014-06-20: 25 mg via INTRAVENOUS
  Filled 2014-06-20: qty 1

## 2014-06-20 MED ORDER — BECLOMETHASONE DIPROPIONATE 80 MCG/ACT IN AERS
1.0000 | INHALATION_SPRAY | RESPIRATORY_TRACT | Status: DC | PRN
  Administered 2014-06-20: 1 via RESPIRATORY_TRACT

## 2014-06-20 NOTE — Progress Notes (Signed)
[facility] PROGRESS NOTE  Heather Wagner ZOX:[id number] DOB: October 07, 1976 DOA: 06/18/2014 PCP: Orland Penman, MD  Assessment/Plan: #1 acute on chronic pancreatitis Questionable etiology. Patient with some improvement in abdominal pain. Patient with no history of alcohol abuse. Lipase levels trending down. 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 Patient insistent on being started on clear liquids. Will discontinue Dilantin PCA pump. Place on clear liquids. Resume Pancrease. Decrease IV fluids to 100 cc per hour. Continue anti-emetics. Follow.  #2 community-acquired pneumonia versus aspiration pneumonia Per chest x-ray. Patient with fever. Urine strep pneumococcus antigen negative. Urine Legionella antigen negative. Patient states she is intolerant to the Levaquin and is causing her to be nauseous and as careful IV azithromycin as she states his only antibiotic that she's which has a pneumonia that works for her. Patient is insistent on being placed on IV azithromycin. Will discontinue IV Levaquin. Start IV azithromycin. Supportive care.  #3 leukocytosis Likely secondary to problem #1. Urinalysis is nitrite negative leukocytes negative. Blood cultures x2 pending. WBC trending down. Continue empiric IV antibiotics.  #4 dehydration IV fluids.  #5 diabetes mellitus CBGs have ranged from 100- 133. Check a hemoglobin A1c. Continue half home dose of Lantus. Sliding scale.  #6 prophylaxis PPI for GI prophylaxis. SCDs for DVT prophylaxis as patient complaining of heparin injections.   Code Status: Full Family Communication: Updated patient no family at bedside. Disposition Plan: Home when medically stable   Consultants:  None  Procedures:  Chest x-ray 06/19/2014, 06/20/2014  CT abdomen and pelvis 06/18/2014  Antibiotics:  IV Levaquin 06/19/2014>>>>06/20/14  IV azithromycin 06/20/2014  HPI/Subjective: Patient states  pancreatic pain has improved and patient was to be started on clear liquids. Patient states his developing a headache because she's not on clear liquids. Patient states which is usually in the hospital for her acute on chronic pancreatitis clear liquids and started on day 2 and she does not understand why do not been started now. Patient also stating that Levaquin is causing her to be nauseous and she is intolerant and would like to be switched to IV azithromycin as is the only antibiotic that is used and works for her. Patient also with complaints of chest tightness and headache earlier on this morning.  Objective: Filed Vitals:   06/20/14 1011  BP: 120/72  Pulse: 116  Temp: 99.5 F (37.5 C)  Resp: 18    Intake/Output Summary (Last 24 hours) at 06/20/14 1238 Last data filed at 06/19/14 1753  Gross per 24 hour  Intake    150 ml  Output      0 ml  Net    150 ml   Filed Weights   06/18/14 2333  Weight: 72.2 kg (159 lb 2.8 oz)    Exam:   General:  NAD  Cardiovascular: RRR  Respiratory: CTAB  Abdomen: Soft, less tender to palpation in the epigastrium and left upper quadrant, positive bowel sounds, no rebound, no guarding.  Musculoskeletal: No clubbing cyanosis or edema  Data Reviewed: Basic Metabolic Panel:  Recent Labs Lab 06/18/14 0049 06/18/14 1841 06/19/14 0528 06/20/14 0552  NA 133* 134* 137 134*  K 3.7 3.8 3.5* 4.7  CL 98 96 101 102  CO2 24 22 25 22   GLUCOSE 240* 235* 131* 109*  BUN 14 8 6  5*  CREATININE 0.48* 0.47* 0.51 0.58  CALCIUM 9.3 9.3 8.2* 7.7*  MG  --   --  1.6  --    Liver Function Tests:  Recent Labs Lab 06/18/14 0049 06/18/14 1841 06/20/14 0552  AST 16 18 19   ALT 15 18 17   ALKPHOS 77 89 87  BILITOT 0.4 0.9 0.8  PROT 7.5 7.7 6.2  ALBUMIN 3.5 3.5 2.4*    Recent Labs Lab 06/18/14 0049 06/18/14 1841 06/19/14 0528 06/20/14 0552  LIPASE 92* 32 19 11   No results found for this basename: AMMONIA,  in the last 168  hours CBC:  Recent Labs Lab 06/18/14 0049 06/18/14 1841 06/19/14 0528 06/20/14 0552  WBC 14.7* 17.7* 16.5* 12.6*  NEUTROABS 10.1*  --   --   --   HGB 10.3* 10.8* 9.4* 8.2*  HCT 32.8* 34.1* 29.4* 25.9*  MCV 76.3* 75.8* 75.4* 76.4*  PLT 390 419* 390 317   Cardiac Enzymes:  Recent Labs Lab 06/20/14 1003  TROPONINI <0.30   BNP (last 3 results) No results found for this basename: PROBNP,  in the last 8760 hours CBG:  Recent Labs Lab 06/19/14 2002 06/20/14 0035 06/20/14 0420 06/20/14 0850 06/20/14 1145  GLUCAP 121* 127* 100* 111* 133*    No results found for this or any previous visit (from the past 240 hour(s)).   Studies: Dg Chest 2 View  06/20/2014   CLINICAL DATA:  Chest tightness. Followup right lower lobe pneumonia pure Patient admitted with acute superimposed upon chronic pancreatitis 2 days ago.  EXAM: CHEST  2 VIEW  COMPARISON:  Two-view chest x-ray yesterday and 06/30/2010.  FINDINGS: Cardiomediastinal silhouette unremarkable and unchanged. Interval improvement in the streaky and patchy opacities in the right lower lobe, though minimal opacities persist. No new pulmonary parenchymal abnormalities elsewhere. Interval increase in size of still small bilateral pleural effusions.  IMPRESSION: 1. Improving right lower lobe pneumonia, though minimal opacities persist. 2. Interval increase in size of still relatively small bilateral pleural effusions since yesterday.   Electronically Signed   By: Hulan Saas M.D.   On: 06/20/2014 11:02   Dg Chest 2 View  06/19/2014   CLINICAL DATA:  Shortness of breath. Leukocytosis. Current history of diabetes and asthma.  EXAM: CHEST  2 VIEW  COMPARISON:  CTA chest 07/01/2010.  Two-view chest x-ray 06/30/2010.  FINDINGS: AP semi-erect and lateral images were obtained. Streaky and patchy airspace opacities in the right lower lobe. Linear atelectasis in the right middle lobe. Lungs otherwise clear. Cardiomediastinal silhouette  unremarkable and unchanged. Visualized bony thorax intact.  IMPRESSION: 1. Right lower lobe pneumonia. 2. Linear atelectasis in the right middle lobe.   Electronically Signed   By: Hulan Saas M.D.   On: 06/19/2014 09:15   Ct Abdomen Pelvis W Contrast  06/18/2014   CLINICAL DATA:  Abdominal pain.  Vomiting.  Recurrent pancreatitis.  EXAM: CT ABDOMEN AND PELVIS WITH CONTRAST  TECHNIQUE: Multidetector CT imaging of the abdomen and pelvis was performed using the standard protocol following bolus administration of intravenous contrast.  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 Mild to moderate diffuse pancreatic edema and peripancreatic inflammatory changes are again demonstrated, with minimal change since previous study. Mild pancreatic ductal dilatation remains stable. No evidence of pancreatic pseudocysts.  Surgical clips from prior cholecystectomy noted. Pneumobilia again noted from prior choledocho jejunostomy. No liver masses are identified. The spleen, adrenal glands, and kidneys are normal in appearance. No evidence hydronephrosis.  A right-sided uterine fibroid is seen measuring 4.2 cm which is slightly increased in size from 3.4 cm previously. Adnexal regions  are unremarkable in appearance. No other soft tissue masses or lymphadenopathy identified. No evidence of bowel obstruction no suspicious bone lesions identified.  IMPRESSION: Mild to moderate acute on chronic pancreatitis, without significant change compared with prior exam.  No evidence of pancreatic pseudocysts or other complication.  Uterine fibroids.   Electronically Signed   By: Myles RosenthalJohn  Stahl M.D.   On: 06/18/2014 21:12    Scheduled Meds: . insulin aspart  0-15 Units Subcutaneous 6 times per day  . insulin glargine  9 Units Subcutaneous BID  . levofloxacin (LEVAQUIN) IV  750 mg Intravenous Q24H  . lipase/protease/amylase   2 capsule Oral TID WC  . ondansetron (ZOFRAN) IV  4 mg Intravenous Once  . pantoprazole (PROTONIX) IV  40 mg Intravenous Q24H   Continuous Infusions: . sodium chloride 100 mL/hr at 06/20/14 [id number]    Principal Problem:   Acute pancreatitis Active Problems:   CAP (community acquired pneumonia)   Diabetes mellitus   Chronic pancreatitis    Time spent: 35 mins    [facility] ColumbiaHOMPSON,DANIEL MD [facility] Pager [phone]. If 7PM-7AM, please contact night-coverage at [email or web address], password [email or web address] 06/20/2014, 12:38 PM  LOS: 2 days

## 2014-06-21 DIAGNOSIS — E119 Type 2 diabetes mellitus without complications: Secondary | ICD-10-CM

## 2014-06-21 LAB — BASIC METABOLIC PANEL
ANION GAP: 13 (ref 5–15)
BUN: 7 mg/dL (ref 6–23)
CHLORIDE: 105 meq/L (ref 96–112)
CO2: 20 mEq/L (ref 19–32)
Calcium: 8.5 mg/dL (ref 8.4–10.5)
Creatinine, Ser: 0.48 mg/dL — ABNORMAL LOW (ref 0.50–1.10)
Glucose, Bld: 150 mg/dL — ABNORMAL HIGH (ref 70–99)
POTASSIUM: 4.4 meq/L (ref 3.7–5.3)
SODIUM: 138 meq/L (ref 137–147)

## 2014-06-21 LAB — CBC
HCT: 25.4 % — ABNORMAL LOW (ref 36.0–46.0)
HEMOGLOBIN: 7.9 g/dL — AB (ref 12.0–15.0)
MCH: 24 pg — ABNORMAL LOW (ref 26.0–34.0)
MCHC: 31.1 g/dL (ref 30.0–36.0)
MCV: 77.2 fL — ABNORMAL LOW (ref 78.0–100.0)
Platelets: 302 10*3/uL (ref 150–400)
RBC: 3.29 MIL/uL — AB (ref 3.87–5.11)
RDW: 15.7 % — ABNORMAL HIGH (ref 11.5–15.5)
WBC: 10.2 10*3/uL (ref 4.0–10.5)

## 2014-06-21 LAB — GLUCOSE, CAPILLARY
GLUCOSE-CAPILLARY: 85 mg/dL (ref 70–99)
Glucose-Capillary: 130 mg/dL — ABNORMAL HIGH (ref 70–99)
Glucose-Capillary: 143 mg/dL — ABNORMAL HIGH (ref 70–99)
Glucose-Capillary: 131 mg/dL — ABNORMAL HIGH (ref 70–99)

## 2014-06-21 LAB — MAGNESIUM: MAGNESIUM: 2.2 mg/dL (ref 1.5–2.5)

## 2014-06-21 MED ORDER — LORAZEPAM 0.5 MG PO TABS
0.5000 mg | ORAL_TABLET | Freq: Once | ORAL | Status: AC
  Administered 2014-06-21: 0.5 mg via ORAL
  Filled 2014-06-21: qty 1

## 2014-06-21 MED ORDER — RIZATRIPTAN BENZOATE 10 MG PO TABS
10.0000 mg | ORAL_TABLET | ORAL | Status: DC | PRN
  Administered 2014-06-21: 10 mg via ORAL

## 2014-06-21 MED ORDER — PROMETHAZINE HCL 25 MG PO TABS: 25.0000 mg | ORAL_TABLET | Freq: Four times a day (QID) | ORAL | Status: DC | PRN

## 2014-06-21 MED ORDER — LEVOFLOXACIN 750 MG PO TABS: 750.0000 mg | ORAL_TABLET | Freq: Every day | ORAL | Status: DC

## 2014-06-21 MED ORDER — LORAZEPAM 2 MG/ML IJ SOLN: 0.5000 mg | Freq: Once | INTRAMUSCULAR | Status: DC

## 2014-06-21 NOTE — Care Management Note (Signed)
    Page 1 of 1   06/21/2014     3:57:24 PM CARE MANAGEMENT NOTE 06/21/2014  Patient:  Heather Wagner,Heather Wagner   Account Number:  [id number]  Date Initiated:  06/21/2014  Documentation initiated by:  [facility],Herman Mell  Subjective/Objective Assessment:   [age] year old female admitted with pancreatitis.     Action/Plan:   From home.   Anticipated DC Date:  06/21/2014   Anticipated DC Plan:  HOME/SELF CARE      DC Planning Services  CM consult      Choice offered to / List presented to:             Status of service:  Completed, signed off Medicare Important Message given?  YES (If response is "NO", the following Medicare IM given date fields will be blank) Date Medicare IM given:  06/21/2014 Medicare IM given by:  [facility],Deann Mclaine Date Additional Medicare IM given:   Additional Medicare IM given by:    Discharge Disposition:  HOME/SELF CARE  Per UR Regulation:  Reviewed for med. necessity/level of care/duration of stay  If discussed at Long Length of Stay Meetings, dates discussed:    Comments:

## 2014-06-21 NOTE — Discharge Summary (Signed)
Physician Discharge Summary  Heather Wagner OZH:[id number] DOB: 08-08-1976 DOA: 06/18/2014  PCP: Orland Penman, MD  Admit date: 06/18/2014 Discharge date: 06/21/2014  Time spent: 65 minutes  Recommendations for Outpatient Follow-up:  1. Followup with JARALLA SHAMLEFFER, IBTEHAL, MD in 1 week. On followup patient's community acquired pneumonia and acute on chronic pancreatitis will need to be reassessed at that time.  Discharge Diagnoses:  Principal Problem:   Acute pancreatitis Active Problems:   CAP (community acquired pneumonia)   Diabetes mellitus   Chronic pancreatitis   Discharge Condition: Stable and improved.  Diet recommendation: Carb modified diet.  Filed Weights   06/18/14 2333  Weight: 72.2 kg (159 lb 2.8 oz)    History of present illness:  Heather Wagner is a 38 y.o. female with h/o hereditary chronic pancreatitis, presented to ED for 2nd time on day of admission with uncontrollable abdominal pain. She stated this was the worst her abdominal pain has been in years and usually her pain is controlled at home with vicodin. Pain not helped by PO dilaudid and phenergan at home so she returned to ED. Patient endorsed 2 episodes of vomiting on the day of admission. Emesis was non bilious. Currently being seen at [facility] for genetic testing (See GI note from May).   Hospital Course:  #1 acute on chronic pancreatitis  Questionable etiology. Patient was admitted with acute on chronic pancreatitis. Patient with no history of alcohol abuse.  Patient was admitted placed on bowel rest, hydrated with IV fluids, pain management initially with a dilaudid PCA pump.  Lipase levels trended down. Patient improved clinically and was started on clear liquids which she tolerated. Patient's diet was advanced to full liquid diet and then a soft diet which she tolerated. Patient's pancreas was resumed. Patient improved clinically abdominal pain improved and patient will be  discharged home in stable and improved condition to followup with PCP as outpatient.  #2 community-acquired pneumonia versus aspiration pneumonia  Per chest x-ray. Patient with fever on admission and a leukocytosis. Urine strep pneumococcus antigen negative. Urine Legionella antigen negative. Patient was initially placed on IV Levaquin however she stated that she was intolerant to the Levaquin and was causing her to be nauseous and as such and insisted on being placed on IV azithromycin. Patient was prescribed azithromycin. Patient improved clinically. Patient's white count trended down. Patient remained afebrile. Patient will be discharged home on 5 more days of oral Levaquin to complete a course of antibiotic therapy. Patient will followup with PCP as outpatient.  #3 leukocytosis  Likely secondary to problem #1 and 2. Urinalysis was nitrite negative leukocytes negative. Blood cultures x2 pending. Chest x-ray which was done was consistent with a commonly acquired pneumonia. Patient was started empirically on IV Levaquin and that patient's insistence was switched to IV azithromycin. WBC trended down. Patient will be discharged on 5 more days of oral Levaquin to complete a course of antibiotic therapy. #4 dehydration  Patient was noted to be dehydrated on admission. Patient was hydrated with IV fluids and was euvolemic by day of discharge.  #5 diabetes mellitus  CBGs ranged from 100- 133. Patient was initially placed on half home dose of Lantus as she was n.p.o. and asked her diet was advanced her insulin was increased back to her home dose. Patient was also maintained on a sliding scale insulin.      Procedures: Chest x-ray 06/19/2014, 06/20/2014  CT abdomen and pelvis 06/18/2014     Consultations:  None  Discharge Exam: Filed  Vitals:   06/21/14 1346  BP: 115/69  Pulse: 99  Temp: 98.1 F (36.7 C)  Resp: 19    General: NAD Cardiovascular: RRR Respiratory: CTAB  Discharge  Instructions You were cared for by a hospitalist during your hospital stay. If you have any questions about your discharge medications or the care you received while you were in the hospital after you are discharged, you can call the unit and asked to speak with the hospitalist on call if the hospitalist that took care of you is not available. Once you are discharged, your primary care physician will handle any further medical issues. Please note that NO REFILLS for any discharge medications will be authorized once you are discharged, as it is imperative that you return to your primary care physician (or establish a relationship with a primary care physician if you do not have one) for your aftercare needs so that they can reassess your need for medications and monitor your lab values.  Discharge Instructions   Diet Carb Modified    Complete by:  As directed   Full liquids and advance as tolerated to solids.     Discharge instructions    Complete by:  As directed   Follow up with JARALLA SHAMLEFFER, IBTEHAL, MD in 1 week.     Increase activity slowly    Complete by:  As directed             Medication List         albuterol 108 (90 BASE) MCG/ACT inhaler  Commonly known as:  PROVENTIL HFA;VENTOLIN HFA  Inhale 2 puffs into the lungs every 6 (six) hours as needed. Asthma     beclomethasone 80 MCG/ACT inhaler  Commonly known as:  QVAR  Inhale 1 puff into the lungs as needed.     calcium carbonate 750 MG chewable tablet  Commonly known as:  TUMS EX  Chew 2-4 tablets by mouth as needed. No more than 10 a day     carboxymethylcellulose 1 % ophthalmic solution  1-2 drops every 2 (two) hours as needed. Dry eyes     fentaNYL 12 MCG/HR  Commonly known as:  DURAGESIC - dosed mcg/hr  Place 12 mcg onto the skin daily.     HYDROcodone-acetaminophen 5-325 MG per tablet  Commonly known as:  NORCO/VICODIN  Take 1 tablet by mouth every 6 (six) hours as needed for moderate pain.      HYDROmorphone 4 MG tablet  Commonly known as:  DILAUDID  Take 1 tablet (4 mg total) by mouth every 4 (four) hours as needed (for pain, until you can contact your pain management clinic).     ibuprofen 200 MG tablet  Commonly known as:  ADVIL,MOTRIN  Take 400 mg by mouth every 6 (six) hours as needed for moderate pain.     insulin aspart 100 UNIT/ML injection  Commonly known as:  novoLOG  Inject into the skin 3 (three) times daily before meals. 1 unit per 10 carb for levels over 150     insulin glargine 100 UNIT/ML injection  Commonly known as:  LANTUS  Inject 18 Units into the skin 2 (two) times daily.     levofloxacin 750 MG tablet  Commonly known as:  LEVAQUIN  Take 1 tablet (750 mg total) by mouth daily. Take for 5 days then stop     Melatonin 5 MG Tabs  Take 1 tablet by mouth at bedtime.     omeprazole 20 MG capsule  Commonly known  as:  PRILOSEC  Take 20 mg by mouth daily.     OVER THE COUNTER MEDICATION  See admin instructions. Calcium Magnesium complex 1000mg -500mg  2 tablets with a meal     polyethylene glycol packet  Commonly known as:  MIRALAX / GLYCOLAX  Take 17 g by mouth daily as needed for mild constipation.     promethazine 25 MG tablet  Commonly known as:  PHENERGAN  Take 1 tablet (25 mg total) by mouth every 6 (six) hours as needed. Nausea     rizatriptan 10 MG tablet  Commonly known as:  MAXALT  Take 10 mg by mouth as needed for migraine. May repeat in 2 hours if needed for headaches     ZENPEP PO  Take 3 capsules by mouth 3 (three) times daily. 1 capsule with snacks.       Allergies  Allergen Reactions  . Nortriptyline Hcl Shortness Of Breath    Swelling anxiety  . Amitriptyline Other (See Comments)    sedation  . Amoxicillin Nausea And Vomiting  . Compazine [Prochlorperazine Maleate] Other (See Comments)    Neurological - uncontrollable eye movements  . Cymbalta [Duloxetine Hcl] Nausea And Vomiting    insomnia  . Dexilant [Dexlansoprazole]  Nausea Only  . Doxycycline Nausea And Vomiting  . Flagyl [Metronidazole Hcl] Nausea And Vomiting    Skin rash  . Penicillins Cross Reactors Nausea And Vomiting    Skin rash/hives  . Topamax [Topiramate] Other (See Comments)    Mood change, anxiety, headaches  . Inapsine [Droperidol] Nausea And Vomiting  . Ketamine Anxiety    Agitation, insomnia   . Lyrica [Pregabalin] Other (See Comments)    Mood changes- become listless, disinterested  . Phenothiazines Hives, Nausea And Vomiting and Rash  . Primaxin [Imipenem] Nausea And Vomiting  . Prozac [Fluoxetine Hcl] Anxiety  . Reglan [Metoclopramide Hcl] Anxiety       Follow-up Information   Follow up with [facility], IBTEHAL, MD. Schedule an appointment as soon as possible for a visit in 1 week.   Specialty:  Internal Medicine   Contact information:   [address]  [address] [place] [address] [phone]        The results of significant diagnostics from this hospitalization (including imaging, microbiology, ancillary and laboratory) are listed below for reference.    Significant Diagnostic Studies: Dg Chest 2 View  06/20/2014   CLINICAL DATA:  Chest tightness. Followup right lower lobe pneumonia pure Patient admitted with acute superimposed upon chronic pancreatitis 2 days ago.  EXAM: CHEST  2 VIEW  COMPARISON:  Two-view chest x-ray yesterday and 06/30/2010.  FINDINGS: Cardiomediastinal silhouette unremarkable and unchanged. Interval improvement in the streaky and patchy opacities in the right lower lobe, though minimal opacities persist. No new pulmonary parenchymal abnormalities elsewhere. Interval increase in size of still small bilateral pleural effusions.  IMPRESSION: 1. Improving right lower lobe pneumonia, though minimal opacities persist. 2. Interval increase in size of still relatively small bilateral pleural effusions since yesterday.   Electronically Signed   By: Hulan Saas M.D.   On: 06/20/2014 11:02    Dg Chest 2 View  06/19/2014   CLINICAL DATA:  Shortness of breath. Leukocytosis. Current history of diabetes and asthma.  EXAM: CHEST  2 VIEW  COMPARISON:  CTA chest 07/01/2010.  Two-view chest x-ray 06/30/2010.  FINDINGS: AP semi-erect and lateral images were obtained. Streaky and patchy airspace opacities in the right lower lobe. Linear atelectasis in the right middle lobe. Lungs  otherwise clear. Cardiomediastinal silhouette unremarkable and unchanged. Visualized bony thorax intact.  IMPRESSION: 1. Right lower lobe pneumonia. 2. Linear atelectasis in the right middle lobe.   Electronically Signed   By: Hulan Saas M.D.   On: 06/19/2014 09:15   Ct Abdomen Pelvis W Contrast  06/18/2014   CLINICAL DATA:  Abdominal pain.  Vomiting.  Recurrent pancreatitis.  EXAM: CT ABDOMEN AND PELVIS WITH CONTRAST  TECHNIQUE: Multidetector CT imaging of the abdomen and pelvis was performed using the standard protocol following bolus administration of intravenous contrast.  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 Mild to moderate diffuse pancreatic edema and peripancreatic inflammatory changes are again demonstrated, with minimal change since previous study. Mild pancreatic ductal dilatation remains stable. No evidence of pancreatic pseudocysts.  Surgical clips from prior cholecystectomy noted. Pneumobilia again noted from prior choledocho jejunostomy. No liver masses are identified. The spleen, adrenal glands, and kidneys are normal in appearance. No evidence hydronephrosis.  A right-sided uterine fibroid is seen measuring 4.2 cm which is slightly increased in size from 3.4 cm previously. Adnexal regions are unremarkable in appearance. No other soft tissue masses or lymphadenopathy identified. No evidence of bowel obstruction no suspicious bone lesions identified.  IMPRESSION: Mild to moderate acute on  chronic pancreatitis, without significant change compared with prior exam.  No evidence of pancreatic pseudocysts or other complication.  Uterine fibroids.   Electronically Signed   By: Myles Rosenthal M.D.   On: 06/18/2014 21:12    Microbiology: Recent Results (from the past 240 hour(s))  CULTURE, BLOOD (ROUTINE X 2)     Status: None   Collection Time    06/19/14  2:56 PM      Result Value Ref Range Status   Specimen Description BLOOD RIGHT ARM   Final   Special Requests     Final   Value: BOTTLES DRAWN AEROBIC AND ANAEROBIC 4CC BOTH BOTTLES   Culture  Setup Time     Final   Value: 06/19/2014 19:12     Performed at [facility]   Culture     Final   Value:        BLOOD CULTURE RECEIVED NO GROWTH TO DATE CULTURE WILL BE HELD FOR 5 DAYS BEFORE ISSUING A FINAL NEGATIVE REPORT     Performed at [facility]   Report Status PENDING   Incomplete  CULTURE, BLOOD (ROUTINE X 2)     Status: None   Collection Time    06/19/14  3:00 PM      Result Value Ref Range Status   Specimen Description BLOOD RIGHT ARM   Final   Special Requests     Final   Value: BOTTLES DRAWN AEROBIC AND ANAEROBIC 5CC BOTH BOTTLES   Culture  Setup Time     Final   Value: 06/19/2014 19:11     Performed at [facility]   Culture     Final   Value:        BLOOD CULTURE RECEIVED NO GROWTH TO DATE CULTURE WILL BE HELD FOR 5 DAYS BEFORE ISSUING A FINAL NEGATIVE REPORT     Performed at [facility]   Report Status PENDING   Incomplete     Labs: Basic Metabolic Panel:  Recent Labs Lab 06/18/14 0049 06/18/14 1841 06/19/14 0528 06/20/14 0552 06/21/14 0455  NA 133* 134* 137 134* 138  K 3.7 3.8 3.5* 4.7  4.4  CL 98 96 101 102 105  CO2 24 22 25 22 20   GLUCOSE 240* 235* 131* 109* 150*  BUN 14 8 6  5* 7  CREATININE 0.48* 0.47* 0.51 0.58 0.48*  CALCIUM 9.3 9.3 8.2* 7.7* 8.5  MG  --   --  1.6  --  2.2   Liver Function Tests:  Recent Labs Lab 06/18/14 0049 06/18/14 1841 06/20/14 0552   AST 16 18 19   ALT 15 18 17   ALKPHOS 77 89 87  BILITOT 0.4 0.9 0.8  PROT 7.5 7.7 6.2  ALBUMIN 3.5 3.5 2.4*    Recent Labs Lab 06/18/14 0049 06/18/14 1841 06/19/14 0528 06/20/14 0552  LIPASE 92* 32 19 11   No results found for this basename: AMMONIA,  in the last 168 hours CBC:  Recent Labs Lab 06/18/14 0049 06/18/14 1841 06/19/14 0528 06/20/14 0552 06/21/14 0455  WBC 14.7* 17.7* 16.5* 12.6* 10.2  NEUTROABS 10.1*  --   --   --   --   HGB 10.3* 10.8* 9.4* 8.2* 7.9*  HCT 32.8* 34.1* 29.4* 25.9* 25.4*  MCV 76.3* 75.8* 75.4* 76.4* 77.2*  PLT 390 419* 390 317 302   Cardiac Enzymes:  Recent Labs Lab 06/20/14 1003 06/20/14 1556 06/20/14 2114  TROPONINI <0.30 <0.30 <0.30   BNP: BNP (last 3 results) No results found for this basename: PROBNP,  in the last 8760 hours CBG:  Recent Labs Lab 06/20/14 2157 06/21/14 0104 06/21/14 0402 06/21/14 0719 06/21/14 1120  GLUCAP 219* 130* 143* 131* 85       Signed:  THOMPSON,DANIEL MD [facility] 06/21/2014, 2:17 PM

## 2014-06-22 ENCOUNTER — Emergency Department (HOSPITAL_COMMUNITY)
Admission: EM | Admit: 2014-06-22 | Discharge: 2014-06-22 | Disposition: A | Discharge status: Home / Self Care | Payer: BC Managed Care – PPO | Attending: Emergency Medicine | Admitting: Emergency Medicine

## 2014-06-22 ENCOUNTER — Emergency Department (HOSPITAL_COMMUNITY): Payer: BC Managed Care – PPO

## 2014-06-22 ENCOUNTER — Encounter (HOSPITAL_COMMUNITY): Payer: Self-pay | Admitting: Emergency Medicine

## 2014-06-22 DIAGNOSIS — J45909 Unspecified asthma, uncomplicated: Secondary | ICD-10-CM | POA: Insufficient documentation

## 2014-06-22 DIAGNOSIS — E119 Type 2 diabetes mellitus without complications: Secondary | ICD-10-CM | POA: Insufficient documentation

## 2014-06-22 DIAGNOSIS — Z3202 Encounter for pregnancy test, result negative: Secondary | ICD-10-CM | POA: Insufficient documentation

## 2014-06-22 DIAGNOSIS — G43019 Migraine without aura, intractable, without status migrainosus: Secondary | ICD-10-CM | POA: Insufficient documentation

## 2014-06-22 DIAGNOSIS — F419 Anxiety disorder, unspecified: Secondary | ICD-10-CM

## 2014-06-22 DIAGNOSIS — Z792 Long term (current) use of antibiotics: Secondary | ICD-10-CM | POA: Insufficient documentation

## 2014-06-22 DIAGNOSIS — T368X5A Adverse effect of other systemic antibiotics, initial encounter: Secondary | ICD-10-CM | POA: Insufficient documentation

## 2014-06-22 DIAGNOSIS — Z880 Allergy status to penicillin: Secondary | ICD-10-CM | POA: Insufficient documentation

## 2014-06-22 DIAGNOSIS — Z87448 Personal history of other diseases of urinary system: Secondary | ICD-10-CM | POA: Insufficient documentation

## 2014-06-22 DIAGNOSIS — Z8719 Personal history of other diseases of the digestive system: Secondary | ICD-10-CM | POA: Insufficient documentation

## 2014-06-22 DIAGNOSIS — R002 Palpitations: Secondary | ICD-10-CM

## 2014-06-22 DIAGNOSIS — Z794 Long term (current) use of insulin: Secondary | ICD-10-CM | POA: Insufficient documentation

## 2014-06-22 DIAGNOSIS — E785 Hyperlipidemia, unspecified: Secondary | ICD-10-CM | POA: Insufficient documentation

## 2014-06-22 DIAGNOSIS — Z79899 Other long term (current) drug therapy: Secondary | ICD-10-CM | POA: Insufficient documentation

## 2014-06-22 DIAGNOSIS — F411 Generalized anxiety disorder: Secondary | ICD-10-CM | POA: Insufficient documentation

## 2014-06-22 LAB — URINALYSIS, ROUTINE W REFLEX MICROSCOPIC
BILIRUBIN URINE: NEGATIVE
GLUCOSE, UA: 100 mg/dL — AB
Ketones, ur: NEGATIVE mg/dL
Leukocytes, UA: NEGATIVE
Nitrite: NEGATIVE
PH: 6.5 (ref 5.0–8.0)
Protein, ur: NEGATIVE mg/dL
SPECIFIC GRAVITY, URINE: 1.006 (ref 1.005–1.030)
Urobilinogen, UA: 1 mg/dL (ref 0.0–1.0)

## 2014-06-22 LAB — URINE MICROSCOPIC-ADD ON

## 2014-06-22 LAB — I-STAT CHEM 8, ED
BUN: 4 mg/dL — ABNORMAL LOW (ref 6–23)
CHLORIDE: 106 meq/L (ref 96–112)
CREATININE: 0.4 mg/dL — AB (ref 0.50–1.10)
Calcium, Ion: 1.15 mmol/L (ref 1.12–1.23)
GLUCOSE: 267 mg/dL — AB (ref 70–99)
HCT: 29 % — ABNORMAL LOW (ref 36.0–46.0)
Hemoglobin: 9.9 g/dL — ABNORMAL LOW (ref 12.0–15.0)
Potassium: 4 mEq/L (ref 3.7–5.3)
Sodium: 140 mEq/L (ref 137–147)
TCO2: 23 mmol/L (ref 0–100)

## 2014-06-22 LAB — PREGNANCY, URINE: Preg Test, Ur: NEGATIVE

## 2014-06-22 MED ORDER — HYDROCODONE-ACETAMINOPHEN 5-325 MG PO TABS
2.0000 | ORAL_TABLET | Freq: Once | ORAL | Status: AC
  Administered 2014-06-22: 2 via ORAL
  Filled 2014-06-22: qty 2

## 2014-06-22 MED ORDER — LORAZEPAM 1 MG PO TABS: ORAL_TABLET | ORAL | Status: DC

## 2014-06-22 MED ORDER — LORAZEPAM 1 MG PO TABS
1.0000 mg | ORAL_TABLET | Freq: Once | ORAL | Status: AC
  Administered 2014-06-22: 1 mg via ORAL
  Filled 2014-06-22: qty 1

## 2014-06-22 NOTE — Discharge Instructions (Signed)
°Emergency Department Resource Guide °1) Find a Doctor and Pay Out of Pocket °Although you won't have to find out who is covered by your insurance plan, it is a good idea to ask around and get recommendations. You will then need to call the office and see if the doctor you have chosen will accept you as a new patient and what types of options they offer for patients who are self-pay. Some doctors offer discounts or will set up payment plans for their patients who do not have insurance, but you will need to ask so you aren't surprised when you get to your appointment. ° °2) Contact Your Local Health Department °Not all health departments have doctors that can see patients for sick visits, but many do, so it is worth a call to see if yours does. If you don't know where your local health department is, you can check in your phone book. The CDC also has a tool to help you locate your state's health department, and many state websites also have listings of all of their local health departments. ° °3) Find a Walk-in Clinic °If your illness is not likely to be very severe or complicated, you may want to try a walk in clinic. These are popping up all over the country in pharmacies, drugstores, and shopping centers. They're usually staffed by nurse practitioners or physician assistants that have been trained to treat common illnesses and complaints. They're usually fairly quick and inexpensive. However, if you have serious medical issues or chronic medical problems, these are probably not your best option. ° °No Primary Care Doctor: °- Call [facility] at  832-8000 - they can help you locate a primary care doctor that  accepts your insurance, provides certain services, etc. °- Physician Referral Service- 1-800-533-3463 ° °Chronic Pain Problems: °Organization         Address  Phone   Notes  °[facility]  (336) 297-2271 Patients need to be referred by their primary care doctor.  ° °Medication  Assistance: °Organization         Address  Phone   Notes  °[place] [facility] [address] °[place], [place] [address] (336) 641-8030 --Must be a resident of [place] °-- Must have NO insurance coverage whatsoever (no Medicaid/ Medicare, etc.) °-- The pt. MUST have a primary care doctor that directs their care regularly and follows them in the community °  °[facility]  (866) 331-1348   °[employer]  (888) 892-1162   ° °Agencies that provide inexpensive medical care: °Organization         Address  Phone   Notes  °[facility]  (336) 832-8035   °[facility]    (336) 832-7272   °[facility] [address] °[place], [place] [address] (336) 832-4777   °[facility] [address], °[place] (336) 271-4999   °[facility]    (336) 373-0678   °[facility]    (336) 272-1050   °[facility] ° [address], [place] Phone:  (336) 832-4444, Fax:  (336) 832-4440 Hours of Operation:  9 am - 6 pm, M-F.  Also accepts Medicaid/Medicare and self-pay.  °[facility] ° [address], [place] Phone: (336) 832-3150, Fax: (336) [phone]. Hours of Operation:  8:30 am - 5:30 pm, M-F.  Also accepts Medicaid and self-pay.  °[facility] [place] [address], [place] Phone: (336) 878-6027   °[facility] [address], [place], [place] (336)723-1848, Ext. 123 Mondays & Thursdays: 7-9 AM.  First 15 patients are seen on a first come, first serve basis. °  ° °Medicaid-accepting [place] Providers: ° °Organization         Address  Phone   Notes  °[facility] [address], [place] (336) 641-2100 Also accepts self-pay patients.  °[facility] [address], [place] ° (336) 856-9996   °[facility] [address], [place]  (336) 288-8857   °[facility] [address] [place] [address], [place] (336) 299-7000   °Veita Bland [address], [place]  ° (336) 373-1557 Only accepts [place] Access Medicaid patients after they have their name applied to their card.  ° °Self-Pay (no insurance) in [place]: ° °Organization         Address  Phone   Notes  °Sickle Cell Patients, [facility] [address], [place] (336) 832-1970   °[facility] [address], [place] (336) 832-4400   °[facility] ° [address], [place] (336) 992-4800   °[facility]/Dr. Osei-Bonsu ° [address] [place] [address], [place] or [address], [place] (336) 841-8500 Phone number for both [place] and [place] locations is the same.  °[facility] [address], [place] (336) 299-0000   °[facility] [address] [place] [address], [place] or [address] (336) 852-7530 °(336) 878-2260   °[facility] [address], [place] (336) 350-1642, phone; (336) 294-5005, fax Sees patients 1st and 3rd Saturday of every month.  Must not qualify for public or private insurance (i.e. Medicaid, Medicare, [place] Health Choice, Veterans' Benefits) • Household income should be no more than 200% of the poverty level •The clinic cannot treat you if you are pregnant or think you are pregnant • Sexually transmitted diseases are not treated at the clinic.  ° ° °Dental Care: °Organization         Address  Phone  Notes  °[place] [facility] [address], [place] (336) 641-6152 Accepts children up to age 21 who are enrolled in Medicaid or [place] Health Choice; pregnant women with a Medicaid card; and children who have applied for Medicaid or [place] Health Choice, but were declined, whose parents can pay a reduced fee at time of service.  °[place]  [facility] [place]  [address], [place] (336) 641-7733 Accepts children up to age 21 who are enrolled in Medicaid or [place] Health Choice; pregnant women with a Medicaid card; and children who have applied for Medicaid or [place] Health Choice, but were declined, whose parents can pay a reduced fee at time of service.  °[facility] ° [address], [place] (336) 641-4533 Patients are seen by appointment only. Walk-ins are not accepted. [facility] will see patients 18 years of age and older. °Monday - Tuesday (8am-5pm) °Most Wednesdays (8:30-5pm) °$30 per visit, cash only  °[facility] ° [address], [place] (336) 641-4533 Patients are seen by appointment only. Walk-ins are not accepted. [facility] will see patients 18 years of age and older. °One   Wednesday Evening (Monthly: Volunteer Based).  $30 per visit, cash only  °[facility]  (919) 537-3737 for adults; Children under age 4, call Graduate Pediatric Dentistry at ([phone]) [phone]. Children aged 4-14, please call ([phone]) [phone] to request a pediatric application. ° Dental services are provided in all areas of dental care including fillings, crowns and bridges, complete and partial dentures, implants, gum treatment, root canals, and extractions. Preventive care is also provided. Treatment is provided to both adults and children. °Patients are selected via a lottery and there is often a waiting list. °  °[facility] [address], °[place] ° (336) 763-8833 [email or web address] °  °[facility] [address], [place], [place] (336)723-1848, Ext. 123 Second and Fourth Thursday of each month, opens at 6:30 AM; Clinic ends at 9 AM.  Patients are seen on a first-come first-served basis, and a limited number are seen during each clinic.  ° °[facility] ° [address], [place], [place] (336) 723-7904    Eligibility Requirements °You must have lived in [place], [place], or [place] counties for at least the last three months. °  You cannot be eligible for state or federal sponsored healthcare insurance, including Veterans Administration, Medicaid, or Medicare. °  You generally cannot be eligible for healthcare insurance through your employer.  °  How to apply: °Eligibility screenings are held every Tuesday and Wednesday afternoon from 1:00 pm until 4:00 pm. You do not need an appointment for the interview!  °[facility] [address], [place], [place] 336-631-2330   °[facility]  336-342-8273   °[facility]  336-703-3100   °[facility]  336-570-6415   ° °Behavioral Health Resources in the Community: °Intensive Outpatient Programs °Organization         Address  Phone  Notes  °[facility] [address], [place], [place] 336-878-6098   °[facility] [address], [place], [place] 336-832-9800   °[facility] [address], [place], [place] ° 336-882-2125   °[facility] [address],  °[place], [place] 1-800-853-5163 or 336-641-4981   °Substance Abuse Resources °Organization         Address  Phone  Notes  °[facility]  336-882-2125   °[facility]  336-784-9470   °[facility]  336-285-9073   °[facility]  336-845-3988   °Residential & Outpatient Substance Abuse Program  1-800-659-3381   °Psychological Services °Organization         Address  Phone  Notes  °[facility]  336- 832-9600   °[facility]  336- 378-7881   °[facility] [address], [place] 1-800-853-5163 or 336-641-4981   ° °Mobile Crisis Teams °Organization         Address  Phone  Notes  °[facility], Mobile Crisis Care Unit  1-877-626-1772   °Assertive °[facility] ° [address]  [place], [place] 336-834-9664   °Sharon DeEsch [address] °[place] 336-554-5454   ° °Self-Help/Support Groups °Organization         Address  Phone             Notes  °[facility]  - variety of support groups  336- 373-1402 Call for more information  °Narcotics Anonymous (NA), [facility] [address], °[place]  2 meetings at this location  ° °  Residential Treatment Programs Organization         Address  Phone  Notes  [facility] [address],    [place]  [phone]   [facility]  [address], [place] [phone], [place], [place] [phone]   [facility] [address], [place] [phone] Admissions: 8am-3pm M-F  [facility] [address]    [place], [place] [phone]   [facility] [address], [place], [place] [phone]   [facility] [address]  [place], [place] [phone]   [facility] Intensive Outpatient [address], [place], [place] [phone]   [facility] ([facility] [address]  [place], [place] [phone] or [phone]   [facility] ([facility] [address] [place], [place] [phone]([phone])[phone] Accepts Medicaid  [facility] [address]  [place] [phone] Substance Abuse/Addiction Treatment   [facility]         Address  Phone  Notes  [facility]  651-115-4485(888) (857) [phone]   Angie FavaJulie Brannon, PhD [address] [place], [place]   [phone](336) [phone] or ([phone](336) [phone]   [facility]   [address] [place], [place] ([phone]) [phone](336) [phone]   [facility] [address], [place], [place] (423)[phone](336) (517) [phone] Insurance/Medicaid/sponsorship through [facility] [address]                                    [place], [place] ([phone](336) ([phone]) [phone] Therapy/tele-psych/case   [facility] [address]   [place], [place] [phone](336) [phone]    Dr. Lolly MustacheArfeen  [phone](336) [phone]   [facility]  [employer] [facility] 1) [address], [place] 2) [address], [place] 3)  [address], [place] [phone](336) [phone](336) [phone]  [phone](336) [phone]   [facility] Child Abuse Hotline [phone](336) (661) 568-1228 or (330)[phone](336) [phone] (After Hours)       Avoid avoid caffinated products, such as teas, colas, coffee, chocolate. Avoid over the counter cold medicines, herbal or "natural vitamin" products, and illicit drugs because they can contain stimulants.  Call your regular medical doctor today to schedule a follow up appointment in the next 2 days.  Return to the Emergency Department immediately if worsening.

## 2014-06-22 NOTE — ED Notes (Signed)
MD at bedside. 

## 2014-06-22 NOTE — ED Notes (Addendum)
Pt states she was just d/c from hospital yesterday, given levofloxacin for pneumonia, took it last night and states ever since she has been feeling as if her heart is racing and feeling shaky. Pt states she may have anxiety. Staets she has palpitation.

## 2014-06-22 NOTE — ED Provider Notes (Signed)
CSN: [id number]     Arrival date & time 06/22/14  1114 History   First MD Initiated Contact with Patient 06/22/14 1132     Chief Complaint  Patient presents with  . Allergic Reaction      HPI Pt was seen at 1135. Per pt, c/o gradual onset and persistence of constant palpitations since last night. Pt states she has been "anxious about taking the strong antibiotic" (levaquin) she was rx upon hospital discharge yesterday for pneumonia and pancreatitis. States she received the same abx by IV while in the hospital without any symptoms. Pt states approximately 2 hours after taking Levaquin PO last night she began to feel "shakey all over," with her "heart racing" and "palpitations." Pt states her symptoms have remained constant into today. States she is having all these symptoms now. Denies CP/SOB, no back pain, no new or worsening abd pain, no N/V/D, no fevers, no calf/LE pain or unilateral swelling.    Past Medical History  Diagnosis Date  . Pancreatitis chronic   . Asthma   . Diabetes mellitus   . Fibromyalgia   . Migraines   . Hyperlipidemia   . IBS (irritable bowel syndrome)   . Chronic sinusitis   . Migraine without aura, with intractable migraine, so stated, without mention of status migrainosus 01/25/2014  . Vulvar vestibulitis    Past Surgical History  Procedure Laterality Date  . Whiple    . Breast reduction surgery    . Dilation and curettage of uterus    . Whipple's     Family History  Problem Relation Age of Onset  . Diabetes Other   . Hypertension Other   . Cancer Other   . Pancreatitis Mother   . Pancreatitis Maternal Uncle   . Migraines Sister    History  Substance Use Topics  . Smoking status: Never Smoker   . Smokeless tobacco: Never Used  . Alcohol Use: No    Review of Systems ROS: Statement: All systems negative except as marked or noted in the HPI; Constitutional: Negative for fever and chills. +"feels shaky all over." ; ; Eyes: Negative for eye pain,  redness and discharge. ; ; ENMT: Negative for ear pain, hoarseness, nasal congestion, sinus pressure and sore throat. ; ; Cardiovascular: +palpitations. Negative for chest pain, diaphoresis, dyspnea and peripheral edema. ; ; Respiratory: Negative for cough, wheezing and stridor. ; ; Gastrointestinal: Negative for nausea, vomiting, diarrhea, abdominal pain, blood in stool, hematemesis, jaundice and rectal bleeding. . ; ; Genitourinary: Negative for dysuria, flank pain and hematuria. ; ; Musculoskeletal: Negative for back pain and neck pain. Negative for swelling and trauma.; ; Skin: Negative for pruritus, rash, abrasions, blisters, bruising and skin lesion.; ; Neuro: Negative for headache, lightheadedness and neck stiffness. Negative for weakness, altered level of consciousness , altered mental status, extremity weakness, paresthesias, involuntary movement, seizure and syncope.; Psych:  +anxiety. No SI, no SA, no HI, no hallucinations.     Allergies  Nortriptyline hcl; Amitriptyline; Amoxicillin; Compazine; Cymbalta; Dexilant; Doxycycline; Flagyl; Penicillins cross reactors; Topamax; Inapsine; Ketamine; Levofloxacin; Lyrica; Phenothiazines; Primaxin; Prozac; and Reglan  Home Medications   Prior to Admission medications   Medication Sig Start Date End Date Taking? Authorizing Provider  albuterol (PROVENTIL HFA;VENTOLIN HFA) 108 (90 BASE) MCG/ACT inhaler Inhale 2 puffs into the lungs every 6 (six) hours as needed. Asthma   Yes Historical Provider, MD  beclomethasone (QVAR) 80 MCG/ACT inhaler Inhale 1 puff into the lungs as needed.    Yes Historical Provider,  MD  calcium carbonate (TUMS EX) 750 MG chewable tablet Chew 2-4 tablets by mouth as needed. No more than 10 a day   Yes Historical Provider, MD  carboxymethylcellulose 1 % ophthalmic solution 1-2 drops every 2 (two) hours as needed. Dry eyes   Yes Historical Provider, MD  HYDROcodone-acetaminophen (NORCO/VICODIN) 5-325 MG per tablet Take 1 tablet by  mouth every 6 (six) hours as needed for moderate pain.   Yes Historical Provider, MD  HYDROmorphone (DILAUDID) 4 MG tablet Take 1 tablet (4 mg total) by mouth every 4 (four) hours as needed (for pain, until you can contact your pain management clinic). 06/18/14  Yes John L Molpus, MD  ibuprofen (ADVIL,MOTRIN) 200 MG tablet Take 400 mg by mouth every 6 (six) hours as needed for moderate pain.    Yes Historical Provider, MD  insulin aspart (NOVOLOG) 100 UNIT/ML injection Inject into the skin 3 (three) times daily before meals. 1 unit per 10 carb for levels over 150   Yes Historical Provider, MD  insulin glargine (LANTUS) 100 UNIT/ML injection Inject 18 Units into the skin 2 (two) times daily.    Yes Historical Provider, MD  levofloxacin (LEVAQUIN) 750 MG tablet Take 1 tablet (750 mg total) by mouth daily. Take for 5 days then stop 06/21/14  Yes Rodolph Bong, MD  Melatonin 5 MG TABS Take 1 tablet by mouth at bedtime.   Yes Historical Provider, MD  Multiple Vitamins-Calcium (VIACTIV MULTI-VITAMIN) CHEW Chew 1 each by mouth daily.   Yes Historical Provider, MD  omeprazole (PRILOSEC) 20 MG capsule Take 20 mg by mouth daily.   Yes Historical Provider, MD  OVER THE COUNTER MEDICATION See admin instructions. Calcium Magnesium complex 1000mg -500mg  2 tablets with a meal   Yes Historical Provider, MD  Pancrelipase, Lip-Prot-Amyl, (ZENPEP PO) Take 3 capsules by mouth 3 (three) times daily. 1 capsule with snacks.   Yes Historical Provider, MD  Pediatric Multivit-Minerals-C (ALIVE MULTI-VITAMIN CHILDRENS) 60 MG CHEW Chew 1 each by mouth daily.   Yes Historical Provider, MD  polyethylene glycol (MIRALAX / GLYCOLAX) packet Take 17 g by mouth daily as needed for mild constipation.   Yes Historical Provider, MD  promethazine (PHENERGAN) 25 MG tablet Take 1 tablet (25 mg total) by mouth every 6 (six) hours as needed. Nausea 06/21/14  Yes Rodolph Bong, MD  rizatriptan (MAXALT) 10 MG tablet Take 10 mg by mouth as  needed for migraine. May repeat in 2 hours if needed for headaches   Yes Historical Provider, MD   BP 134/89  Pulse 87  Temp(Src) 98.2 F (36.8 C) (Oral)  Resp 20  SpO2 100%  LMP 06/16/2014 Physical Exam 1140: Physical examination:  Nursing notes reviewed; Vital signs and O2 SAT reviewed;  Constitutional: Well developed, Well nourished, Well hydrated, In no acute distress; Head:  Normocephalic, atraumatic; Eyes: EOMI, PERRL, No scleral icterus; ENMT: Mouth and pharynx normal, Mucous membranes moist; Neck: Supple, Full range of motion, No lymphadenopathy; Cardiovascular: Regular rate and rhythm, No murmur, rub, or gallop. HR 80's during exam.; Respiratory: Breath sounds clear & equal bilaterally, No rales, rhonchi, wheezes.  Speaking full sentences with ease, Normal respiratory effort/excursion; Chest: Nontender, Movement normal; Abdomen: Soft, Nontender, Nondistended, Normal bowel sounds; Genitourinary: No CVA tenderness; Extremities: Pulses normal, No tenderness, No edema, No calf edema or asymmetry.; Neuro: AA&Ox3, Major CN grossly intact.  Speech clear. No gross focal motor or sensory deficits in extremities. Climbs on and off stretcher easily by herself. Gait steady.; Skin: Color normal,  Warm, Dry.; Psych:  Anxious.     ED Course  Procedures     EKG Interpretation   Date/Time:  Tuesday June 22 2014 11:27:33 EDT Ventricular Rate:  79 PR Interval:  127 QRS Duration: 83 QT Interval:  377 QTC Calculation: 432 R Axis:   72 Text Interpretation:  Sinus rhythm Otherwise normal ECG When compared with  ECG of 06/20/2014 Rate slower Confirmed by [facility]  MD, Nicholos Johns [phone])  on 06/22/2014 1:02:37 PM      MDM  MDM Reviewed: previous chart, nursing note and vitals Reviewed previous: labs and x-ray Interpretation: labs and x-ray   Results for orders placed during the hospital encounter of 06/22/14  URINALYSIS, ROUTINE W REFLEX MICROSCOPIC      Result Value Ref Range   Color, Urine  YELLOW  YELLOW   APPearance CLEAR  CLEAR   Specific Gravity, Urine 1.006  1.005 - 1.030   pH 6.5  5.0 - 8.0   Glucose, UA 100 (*) NEGATIVE mg/dL   Hgb urine dipstick LARGE (*) NEGATIVE   Bilirubin Urine NEGATIVE  NEGATIVE   Ketones, ur NEGATIVE  NEGATIVE mg/dL   Protein, ur NEGATIVE  NEGATIVE mg/dL   Urobilinogen, UA 1.0  0.0 - 1.0 mg/dL   Nitrite NEGATIVE  NEGATIVE   Leukocytes, UA NEGATIVE  NEGATIVE  PREGNANCY, URINE      Result Value Ref Range   Preg Test, Ur NEGATIVE  NEGATIVE  URINE MICROSCOPIC-ADD ON      Result Value Ref Range   Squamous Epithelial / LPF RARE  RARE   RBC / HPF 11-20  <3 RBC/hpf  I-STAT CHEM 8, ED      Result Value Ref Range   Sodium 140  137 - 147 mEq/L   Potassium 4.0  3.7 - 5.3 mEq/L   Chloride 106  96 - 112 mEq/L   BUN 4 (*) 6 - 23 mg/dL   Creatinine, Ser 6.04 (*) 0.50 - 1.10 mg/dL   Glucose, Bld 540 (*) 70 - 99 mg/dL   Calcium, Ion 9.81  1.91 - 1.23 mmol/L   TCO2 23  0 - 100 mmol/L   Hemoglobin 9.9 (*) 12.0 - 15.0 g/dL   HCT 47.8 (*) 29.5 - 62.1 %   Dg Chest 2 View 06/22/2014   CLINICAL DATA:  Pneumonia and pancreatitis  EXAM: CHEST  2 VIEW  COMPARISON:  PA and lateral chest x-ray of June 20, 2014  FINDINGS: The lungs are well-expanded. There is no focal infiltrate. There are small bilateral pleural effusions. The heart and mediastinal structures are normal. The bony thorax is unremarkable.  IMPRESSION: There are small bilateral pleural effusions which have decreased slightly since the previous study.   Electronically Signed   By: David  [place]   On: 06/22/2014 12:36    1200:  Pt c/o symptoms on arrival, monitor with NSR, rate 80's. Continued to state her "heart is racing" without change in monitor. Ativan given. Pt also requested a dose of her usual pain medications "because I skipped a dose this morning." Will continue to monitor.  1345:  Workup reassuring. H/H per baseline. VS remain stable, monitor remained NSR, rate 70-80's. Pt has ambulated with  steady gait, easy resps, NAD. States she feels "better" after ativan and wants to go home now. Requesting "a few pills of ativan" until she is able to f/u with her PMD. Will rx short course only. Dx and testing d/w pt and family.  Questions answered.  Verb understanding, agreeable to d/c  home with outpt f/u.    Laray Anger, DO 06/24/14 [phone]

## 2014-06-25 LAB — CULTURE, BLOOD (ROUTINE X 2)
Culture: NO GROWTH
Culture: NO GROWTH

## 2014-07-07 ENCOUNTER — Ambulatory Visit
Admission: RE | Admit: 2014-07-07 | Discharge: 2014-07-07 | Disposition: A | Discharge status: Home / Self Care | Payer: BC Managed Care – PPO | Source: Ambulatory Visit | Attending: Internal Medicine | Admitting: Internal Medicine

## 2014-07-07 ENCOUNTER — Other Ambulatory Visit: Payer: Self-pay | Admitting: Internal Medicine

## 2014-07-07 DIAGNOSIS — J189 Pneumonia, unspecified organism: Secondary | ICD-10-CM

## 2014-07-29 ENCOUNTER — Institutional Professional Consult (permissible substitution): Payer: BC Managed Care – PPO | Admitting: Internal Medicine

## 2014-07-30 ENCOUNTER — Institutional Professional Consult (permissible substitution): Payer: BC Managed Care – PPO | Admitting: Internal Medicine

## 2014-08-30 ENCOUNTER — Institutional Professional Consult (permissible substitution): Payer: BC Managed Care – PPO | Admitting: Pulmonary Disease

## 2014-09-23 ENCOUNTER — Institutional Professional Consult (permissible substitution): Payer: BC Managed Care – PPO | Admitting: Pulmonary Disease

## 2014-10-20 ENCOUNTER — Ambulatory Visit: Payer: BC Managed Care – PPO | Admitting: Podiatry

## 2014-10-25 ENCOUNTER — Institutional Professional Consult (permissible substitution): Payer: BC Managed Care – PPO | Admitting: Pulmonary Disease

## 2014-12-06 ENCOUNTER — Ambulatory Visit (INDEPENDENT_AMBULATORY_CARE_PROVIDER_SITE_OTHER): Payer: BC Managed Care – PPO | Admitting: Pulmonary Disease

## 2014-12-06 ENCOUNTER — Encounter: Payer: Self-pay | Admitting: Pulmonary Disease

## 2014-12-06 VITALS — BP 124/72 | HR 89 | Temp 98.2°F | Ht 64.0 in | Wt 165.6 lb

## 2014-12-06 DIAGNOSIS — J4540 Moderate persistent asthma, uncomplicated: Secondary | ICD-10-CM

## 2014-12-06 NOTE — Assessment & Plan Note (Addendum)
The patient has a long-standing history of asthma dating back to childhood, but was well-controlled on low-dose Qvar when she lived in [place]. She has had some increased rescue inhaler use since being in [place], but has had great difficulty since her episode of pneumonia at this summer. She is on a low dose of inhaled corticosteroids, and we'll therefore increase her Qvar to 2 puffs twice a day. I have had a long discussion with her about the inflammatory mechanism of asthma, and how inhaled corticosteroids is the treatment of choice long-term. She understands that we may have to step up her treatment if she continues to have difficulties with the addition of a long-acting beta agonist. Finally, I have also reviewed with her the role of allergy, chronic sinus disease, reflux disease, as well as obesity. We may have to look at these individually if she continues to have issues.  Of note, her spirometry today is totally normal.

## 2014-12-06 NOTE — Progress Notes (Signed)
   Subjective:    Patient ID: Heather Wagner, female    DOB: 1976/08/26, 39 y.o.   MRN: [id number]  HPI The patient is a 39 year old female who I've been asked to see for management of asthma. She was diagnosed as a child while living in [place], and initially was only on medications with flareups associated with infection. She did not take any kind of maintenance medication during adolescence, but did require daily inhaled steroid starting in her late 57s. She did fairly well with this while living in [place], but moved here in 2011. Since doing so, she is seen some increase in rescue inhaler use, but not excessive. This summer she was diagnosed with right lower lobe pneumonia, and although her chest x-ray has totally cleared, she is not felt that her asthma has been adequately cold. She has had increased rescue inhaler use. She takes Qvar on a daily basis, but only uses 1 puff twice a day. She typically describes her asthma flare as being tightness in the chest along with symptoms of air trapping. She also gets shortness of breath, but does not cough. She has only mild allergy symptoms, although testing in the past has been very abnormal according to her history. She also has a history of recurrent sinus infections, as well as occasional GERD symptoms. It should be noted that she has tried dulera, and although this did help her asthma symptoms, and resulted in significant agitation. She has not had recent spirometry.   Review of Systems  Constitutional: Negative for fever and unexpected weight change.  HENT: Positive for congestion, dental problem, sore throat and trouble swallowing. Negative for ear pain, nosebleeds, postnasal drip, rhinorrhea, sinus pressure and sneezing.   Eyes: Negative for redness and itching.  Respiratory: Positive for shortness of breath. Negative for cough, chest tightness and wheezing.   Cardiovascular: Positive for palpitations. Negative for leg swelling.    Gastrointestinal: Positive for abdominal pain. Negative for nausea and vomiting.  Genitourinary: Negative for dysuria.  Musculoskeletal: Positive for arthralgias. Negative for joint swelling.  Skin: Negative for rash.  Neurological: Positive for headaches.  Hematological: Does not bruise/bleed easily.  Psychiatric/Behavioral: Negative for dysphoric mood. The patient is not nervous/anxious.        Objective:   Physical Exam Constitutional:  Obese female, no acute distress  HENT:  Nares patent without discharge  Oropharynx without exudate, palate and uvula are normal  Eyes:  Perrla, eomi, no scleral icterus  Neck:  No JVD, no TMG  Cardiovascular:  Normal rate, regular rhythm, no rubs or gallops.  No murmurs        Intact distal pulses  Pulmonary :  Normal breath sounds, no stridor or respiratory distress   No rales, rhonchi, or wheezing  Abdominal:  Soft, nondistended, bowel sounds present.  No tenderness noted.   Musculoskeletal:  No lower extremity edema noted.  Lymph Nodes:  No cervical lymphadenopathy noted  Skin:  No cyanosis noted  Neurologic:  Alert, appropriate, moves all 4 extremities without obvious deficit.         Assessment & Plan:

## 2014-12-06 NOTE — Patient Instructions (Signed)
Increase qvar to 2 inhalations am and pm everyday for the next 4 weeks.  If you feel that your symptoms are still not controlled, let us know.  If you are doing better, will see you back in 3mos. Work on weight reduction Will give you a spacer to try with the qvar to see if it gives you better lung deposition.

## 2014-12-13 ENCOUNTER — Ambulatory Visit (INDEPENDENT_AMBULATORY_CARE_PROVIDER_SITE_OTHER): Payer: BLUE CROSS/BLUE SHIELD | Admitting: Podiatry

## 2014-12-13 ENCOUNTER — Encounter: Payer: Self-pay | Admitting: Podiatry

## 2014-12-13 VITALS — BP 145/89 | HR 88 | Resp 14 | Ht 64.0 in | Wt 160.0 lb

## 2014-12-13 DIAGNOSIS — E119 Type 2 diabetes mellitus without complications: Secondary | ICD-10-CM

## 2014-12-13 DIAGNOSIS — L84 Corns and callosities: Secondary | ICD-10-CM

## 2014-12-13 DIAGNOSIS — M2010 Hallux valgus (acquired), unspecified foot: Secondary | ICD-10-CM

## 2014-12-13 NOTE — Patient Instructions (Signed)
Diabetes and Foot Care Diabetes may cause you to have problems because of poor blood supply (circulation) to your feet and legs. This may cause the skin on your feet to become thinner, break easier, and heal more slowly. Your skin may become dry, and the skin may peel and crack. You may also have nerve damage in your legs and feet causing decreased feeling in them. You may not notice minor injuries to your feet that could lead to infections or more serious problems. Taking care of your feet is one of the most important things you can do for yourself.  HOME CARE INSTRUCTIONS  Wear shoes at all times, even in the house. Do not go barefoot. Bare feet are easily injured.  Check your feet daily for blisters, cuts, and redness. If you cannot see the bottom of your feet, use a mirror or ask someone for help.  Wash your feet with warm water (do not use hot water) and mild soap. Then pat your feet and the areas between your toes until they are completely dry. Do not soak your feet as this can dry your skin.  Apply a moisturizing lotion or petroleum jelly (that does not contain alcohol and is unscented) to the skin on your feet and to dry, brittle toenails. Do not apply lotion between your toes.  Trim your toenails straight across. Do not dig under them or around the cuticle. File the edges of your nails with an emery board or nail file.  Do not cut corns or calluses or try to remove them with medicine.  Wear clean socks or stockings every day. Make sure they are not too tight. Do not wear knee-high stockings since they may decrease blood flow to your legs.  Wear shoes that fit properly and have enough cushioning. To break in new shoes, wear them for just a few hours a day. This prevents you from injuring your feet. Always look in your shoes before you put them on to be sure there are no objects inside.  Do not cross your legs. This may decrease the blood flow to your feet.  If you find a minor scrape,  cut, or break in the skin on your feet, keep it and the skin around it clean and dry. These areas may be cleansed with mild soap and water. Do not cleanse the area with peroxide, alcohol, or iodine.  When you remove an adhesive bandage, be sure not to damage the skin around it.  If you have a wound, look at it several times a day to make sure it is healing.  Do not use heating pads or hot water bottles. They may burn your skin. If you have lost feeling in your feet or legs, you may not know it is happening until it is too late.  Make sure your health care provider performs a complete foot exam at least annually or more often if you have foot problems. Report any cuts, sores, or bruises to your health care provider immediately. SEEK MEDICAL CARE IF:   You have an injury that is not healing.  You have cuts or breaks in the skin.  You have an ingrown nail.  You notice redness on your legs or feet.  You feel burning or tingling in your legs or feet.  You have pain or cramps in your legs and feet.  Your legs or feet are numb.  Your feet always feel cold. SEEK IMMEDIATE MEDICAL CARE IF:   There is increasing redness,   swelling, or pain in or around a wound.  There is a red line that goes up your leg.  Pus is coming from a wound.  You develop a fever or as directed by your health care provider.  You notice a bad smell coming from an ulcer or wound. Document Released: 11/16/2000 Document Revised: 07/22/2013 Document Reviewed: 04/28/2013 Exit[employer] Patient Information 2015 [employer]. This information is not intended to replace advice given to you by your health care provider. Make sure you discuss any questions you have with your health care provider.  

## 2014-12-13 NOTE — Progress Notes (Signed)
   Subjective:    Patient ID: Heather Wagner, female    DOB: [birth year], 39 y.o.   MRN: [id number]  HPI Comments: N callouses L B/L 1st toes and B/L 3rd MPJ plantar D and O long-term C hard painful skin A diabetes, and difficult to trim T pumus, foot creams  Pt complains of change in color in B/L toenails and thickening.  Pt states began noticing the widening of the forefeet 1 - 2 years, and has begun to have aching in the left 1st MPJ.  Denies history of skin ulceration or claudication   Review of Systems  Respiratory: Positive for chest tightness, shortness of breath and wheezing.        Pt states her pulmonary doctor has increased her Qvar to 2 puff am and 2 puff pm.  Gastrointestinal:       Chronic pancreatitis  Neurological: Positive for headaches.  All other systems reviewed and are negative.      Objective:   Physical Exam  Orientated 3  Vascular: DP pulses 2/4 bilaterally PT pulses 2/4 bilaterally Capillary refill immediate bilaterally  Neurological: Ankle reflex equal and reactive bilaterally Vibratory sensation intact bilaterally Sensation to 10 g monofilament wire intact 5/5 bilaterally  Dermatological: Plantar callus medial hallux bilaterally Plantar callus sub-third MPJ right and sub-first, second, fifth MPJ left The toenails one through 5 bilaterally demonstrate no deformity or texture and color changes   Musculoskeletal: HAV deformities right greater than left Functional hallux limitus bilaterally No pain or crepitus on range of motion of the first MPJ bilaterally There is no restriction ankle, subtalar, midtarsal joints bilaterally      Assessment & Plan:Assessmen    Assessment: Satisfactory neurovascular status 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

## 2014-12-16 ENCOUNTER — Telehealth: Payer: Self-pay | Admitting: Pulmonary Disease

## 2014-12-16 NOTE — Telephone Encounter (Signed)
Called spoke with pt. I made her aware in our files it states never smoker. She needed nothing further

## 2015-01-31 ENCOUNTER — Other Ambulatory Visit: Payer: Self-pay | Admitting: Pain Medicine

## 2015-01-31 ENCOUNTER — Ambulatory Visit
Admission: RE | Admit: 2015-01-31 | Discharge: 2015-01-31 | Disposition: A | Discharge status: Home / Self Care | Payer: BLUE CROSS/BLUE SHIELD | Source: Ambulatory Visit | Attending: Pain Medicine | Admitting: Pain Medicine

## 2015-01-31 DIAGNOSIS — M25552 Pain in left hip: Secondary | ICD-10-CM

## 2015-01-31 DIAGNOSIS — M545 Low back pain: Secondary | ICD-10-CM

## 2015-01-31 DIAGNOSIS — M25551 Pain in right hip: Secondary | ICD-10-CM

## 2015-03-07 ENCOUNTER — Ambulatory Visit: Payer: BC Managed Care – PPO | Admitting: Pulmonary Disease

## 2015-04-04 ENCOUNTER — Ambulatory Visit: Payer: BLUE CROSS/BLUE SHIELD | Admitting: Pulmonary Disease

## 2015-05-13 ENCOUNTER — Ambulatory Visit (INDEPENDENT_AMBULATORY_CARE_PROVIDER_SITE_OTHER): Payer: BLUE CROSS/BLUE SHIELD | Admitting: Pulmonary Disease

## 2015-05-13 ENCOUNTER — Encounter: Payer: Self-pay | Admitting: Pulmonary Disease

## 2015-05-13 VITALS — BP 124/94 | HR 78 | Temp 98.7°F | Ht 64.0 in | Wt 159.0 lb

## 2015-05-13 DIAGNOSIS — J4540 Moderate persistent asthma, uncomplicated: Secondary | ICD-10-CM | POA: Diagnosis not present

## 2015-05-13 NOTE — Progress Notes (Signed)
   Subjective:    Patient ID: Heather Wagner, female    DOB: [birth year], 39 y.o.   MRN: [id number]  HPI The patient comes in today for follow-up of her asthma. She has been staying on her Qvar, but has decreased the dose because she has done so well. She has not had an acute exacerbation or pulmonary infection since the last visit. She has not been using her rescue inhaler frequently.   Review of Systems  Constitutional: Negative for fever and unexpected weight change.  HENT: Negative for congestion, dental problem, ear pain, nosebleeds, postnasal drip, rhinorrhea, sinus pressure, sneezing, sore throat and trouble swallowing.   Eyes: Negative for redness and itching.  Respiratory: Negative for cough, chest tightness, shortness of breath and wheezing.   Cardiovascular: Negative for palpitations and leg swelling.  Gastrointestinal: Negative for nausea and vomiting.  Genitourinary: Negative for dysuria.  Musculoskeletal: Negative for joint swelling.  Skin: Negative for rash.  Neurological: Negative for headaches.  Hematological: Does not bruise/bleed easily.  Psychiatric/Behavioral: Negative for dysphoric mood. The patient is not nervous/anxious.        Objective:   Physical Exam Overweight female in no acute distress Nose without purulence or discharge noted Neck without lymphadenopathy or thyromegaly Chest totally clear to auscultation, no wheezing Cardiac exam with regular rate and rhythm Lower extremities without edema, no cyanosis Alert and oriented, moves all 4 extremities.       Assessment & Plan:

## 2015-05-13 NOTE — Patient Instructions (Signed)
Stay on qvar, but would try 2 inhalations each am rather than one twice a day.  Can increase dose to 2 twice a day if having increase in symptoms.  followup with Dr. Kendrick Fries in 60mos, then can probably spread out to yearly

## 2015-05-13 NOTE — Assessment & Plan Note (Signed)
The patient has done very well since the last visit on Qvar, and has decreased her dose to 2 puffs day. I have told her this is okay, but if she has any increasing symptoms she needs to go back to 2 puffs twice a day.  I have asked her to come back in 6 months to make sure things are going well, and then we can spread her visits out to yearly.

## 2015-06-28 ENCOUNTER — Encounter (HOSPITAL_COMMUNITY): Payer: Self-pay

## 2015-06-28 ENCOUNTER — Emergency Department (HOSPITAL_COMMUNITY)
Admission: EM | Admit: 2015-06-28 | Discharge: 2015-06-29 | Disposition: A | Discharge status: Home / Self Care | Payer: BLUE CROSS/BLUE SHIELD | Attending: Emergency Medicine | Admitting: Emergency Medicine

## 2015-06-28 DIAGNOSIS — J45909 Unspecified asthma, uncomplicated: Secondary | ICD-10-CM | POA: Insufficient documentation

## 2015-06-28 DIAGNOSIS — Z8742 Personal history of other diseases of the female genital tract: Secondary | ICD-10-CM | POA: Diagnosis not present

## 2015-06-28 DIAGNOSIS — Z880 Allergy status to penicillin: Secondary | ICD-10-CM | POA: Diagnosis not present

## 2015-06-28 DIAGNOSIS — Z794 Long term (current) use of insulin: Secondary | ICD-10-CM | POA: Diagnosis not present

## 2015-06-28 DIAGNOSIS — E119 Type 2 diabetes mellitus without complications: Secondary | ICD-10-CM | POA: Insufficient documentation

## 2015-06-28 DIAGNOSIS — Z8739 Personal history of other diseases of the musculoskeletal system and connective tissue: Secondary | ICD-10-CM | POA: Insufficient documentation

## 2015-06-28 DIAGNOSIS — R1013 Epigastric pain: Secondary | ICD-10-CM | POA: Diagnosis present

## 2015-06-28 DIAGNOSIS — G43019 Migraine without aura, intractable, without status migrainosus: Secondary | ICD-10-CM | POA: Insufficient documentation

## 2015-06-28 DIAGNOSIS — K861 Other chronic pancreatitis: Secondary | ICD-10-CM | POA: Diagnosis not present

## 2015-06-28 DIAGNOSIS — Z79899 Other long term (current) drug therapy: Secondary | ICD-10-CM | POA: Diagnosis not present

## 2015-06-28 DIAGNOSIS — Z7951 Long term (current) use of inhaled steroids: Secondary | ICD-10-CM | POA: Insufficient documentation

## 2015-06-28 DIAGNOSIS — E785 Hyperlipidemia, unspecified: Secondary | ICD-10-CM | POA: Diagnosis not present

## 2015-06-28 DIAGNOSIS — Z3202 Encounter for pregnancy test, result negative: Secondary | ICD-10-CM | POA: Insufficient documentation

## 2015-06-28 NOTE — ED Notes (Signed)
Patient stated "I do not want to be stuck for labs, they can do an IV in the back".  Patient refused lab draw.

## 2015-06-28 NOTE — ED Notes (Signed)
Pt presents with c/o abdominal pain and vomiting. Pt reports the abdominal pain started yesterday around 3 pm and the vomiting started today and she is unable to keep any fluids down. Pt reports she has pancreatitis and believes this to be a flare-up.

## 2015-06-29 LAB — CBC
HEMATOCRIT: 34.5 % — AB (ref 36.0–46.0)
HEMOGLOBIN: 11.2 g/dL — AB (ref 12.0–15.0)
MCH: 24.3 pg — ABNORMAL LOW (ref 26.0–34.0)
MCHC: 32.5 g/dL (ref 30.0–36.0)
MCV: 75 fL — AB (ref 78.0–100.0)
Platelets: 436 10*3/uL — ABNORMAL HIGH (ref 150–400)
RBC: 4.6 MIL/uL (ref 3.87–5.11)
RDW: 15.9 % — AB (ref 11.5–15.5)
WBC: 12.9 10*3/uL — AB (ref 4.0–10.5)

## 2015-06-29 LAB — COMPREHENSIVE METABOLIC PANEL
ALK PHOS: 90 U/L (ref 38–126)
ALT: 25 U/L (ref 14–54)
AST: 24 U/L (ref 15–41)
Albumin: 3.9 g/dL (ref 3.5–5.0)
Anion gap: 10 (ref 5–15)
BUN: 13 mg/dL (ref 6–20)
CHLORIDE: 101 mmol/L (ref 101–111)
CO2: 24 mmol/L (ref 22–32)
CREATININE: 0.5 mg/dL (ref 0.44–1.00)
Calcium: 9 mg/dL (ref 8.9–10.3)
GFR calc Af Amer: >60 mL/min (ref >60)
GFR calc non Af Amer: >60 mL/min (ref >60)
GLUCOSE: 158 mg/dL — AB (ref 65–99)
POTASSIUM: 3.8 mmol/L (ref 3.5–5.1)
Sodium: 135 mmol/L (ref 135–145)
TOTAL PROTEIN: 8.1 g/dL (ref 6.5–8.1)
Total Bilirubin: 0.7 mg/dL (ref 0.3–1.2)

## 2015-06-29 LAB — URINALYSIS, ROUTINE W REFLEX MICROSCOPIC
Bilirubin Urine: NEGATIVE
Glucose, UA: NEGATIVE mg/dL
Hgb urine dipstick: NEGATIVE
LEUKOCYTES UA: NEGATIVE
Nitrite: NEGATIVE
PROTEIN: NEGATIVE mg/dL
Specific Gravity, Urine: 1.021 (ref 1.005–1.030)
UROBILINOGEN UA: 0.2 mg/dL (ref 0.0–1.0)
pH: 5.5 (ref 5.0–8.0)

## 2015-06-29 LAB — I-STAT BETA HCG BLOOD, ED (MC, WL, AP ONLY): I-stat hCG, quantitative: <5 m[IU]/mL (ref <5)

## 2015-06-29 LAB — LIPASE, BLOOD: Lipase: <10 U/L — ABNORMAL LOW (ref 22–51)

## 2015-06-29 MED ORDER — ONDANSETRON HCL 4 MG/2ML IJ SOLN
4.0000 mg | Freq: Once | INTRAMUSCULAR | Status: AC
  Administered 2015-06-29: 4 mg via INTRAVENOUS
  Filled 2015-06-29: qty 2

## 2015-06-29 MED ORDER — HYDROMORPHONE HCL 1 MG/ML IJ SOLN
1.0000 mg | Freq: Once | INTRAMUSCULAR | Status: AC
  Administered 2015-06-29: 1 mg via INTRAVENOUS
  Filled 2015-06-29: qty 1

## 2015-06-29 MED ORDER — SODIUM CHLORIDE 0.9 % IV BOLUS (SEPSIS)
1000.0000 mL | Freq: Once | INTRAVENOUS | Status: AC
  Administered 2015-06-29: 1000 mL via INTRAVENOUS

## 2015-06-29 MED ORDER — ONDANSETRON HCL 4 MG/2ML IJ SOLN
2.0000 mg | Freq: Once | INTRAMUSCULAR | Status: AC
  Administered 2015-06-29: 2 mg via INTRAVENOUS
  Filled 2015-06-29: qty 2

## 2015-06-29 MED ORDER — ONDANSETRON 4 MG PO TBDP: 4.0000 mg | ORAL_TABLET | Freq: Three times a day (TID) | ORAL | Status: DC | PRN

## 2015-06-29 NOTE — Discharge Instructions (Signed)
Please follow up with your primary care physician in 1-2 days. If you do not have one please call the [facility] and wellness Center number listed above. Please follow up with your gastroenterologist to schedule a follow up appointment.  Please read all discharge instructions and return precautions.   Acute Pancreatitis Acute pancreatitis is a disease in which the pancreas becomes suddenly inflamed. The pancreas is a large gland located behind your stomach. The pancreas produces enzymes that help digest food. The pancreas also releases the hormones glucagon and insulin that help regulate blood sugar. Damage to the pancreas occurs when the digestive enzymes from the pancreas are activated and begin attacking the pancreas before being released into the intestine. Most acute attacks last a couple of days and can cause serious complications. Some people become dehydrated and develop low blood pressure. In severe cases, bleeding into the pancreas can lead to shock and can be life-threatening. The lungs, heart, and kidneys may fail. CAUSES  Pancreatitis can happen to anyone. In some cases, the cause is unknown. Most cases are caused by:  Alcohol abuse.  Gallstones. Other less common causes are:  Certain medicines.  Exposure to certain chemicals.  Infection.  Damage caused by an accident (trauma).  Abdominal surgery. SYMPTOMS   Pain in the upper abdomen that may radiate to the back.  Tenderness and swelling of the abdomen.  Nausea and vomiting. DIAGNOSIS  Your caregiver will perform a physical exam. Blood and stool tests may be done to confirm the diagnosis. Imaging tests may also be done, such as X-rays, CT scans, or an ultrasound of the abdomen. TREATMENT  Treatment usually requires a stay in the hospital. Treatment may include:  Pain medicine.  Fluid replacement through an intravenous line (IV).  Placing a tube in the stomach to remove stomach contents and control vomiting.  Not  eating for 3 or 4 days. This gives your pancreas a rest, because enzymes are not being produced that can cause further damage.  Antibiotic medicines if your condition is caused by an infection.  Surgery of the pancreas or gallbladder. HOME CARE INSTRUCTIONS   Follow the diet advised by your caregiver. This may involve avoiding alcohol and decreasing the amount of fat in your diet.  Eat smaller, more frequent meals. This reduces the amount of digestive juices the pancreas produces.  Drink enough fluids to keep your urine clear or pale yellow.  Only take over-the-counter or prescription medicines as directed by your caregiver.  Avoid drinking alcohol if it caused your condition.  Do not smoke.  Get plenty of rest.  Check your blood sugar at home as directed by your caregiver.  Keep all follow-up appointments as directed by your caregiver. SEEK MEDICAL CARE IF:   You do not recover as quickly as expected.  You develop new or worsening symptoms.  You have persistent pain, weakness, or nausea.  You recover and then have another episode of pain. SEEK IMMEDIATE MEDICAL CARE IF:   You are unable to eat or keep fluids down.  Your pain becomes severe.  You have a fever or persistent symptoms for more than 2 to 3 days.  You have a fever and your symptoms suddenly get worse.  Your skin or the white part of your eyes turn yellow (jaundice).  You develop vomiting.  You feel dizzy, or you faint.  Your blood sugar is high (over 300 mg/dL). MAKE SURE YOU:   Understand these instructions.  Will watch your condition.  Will get  help right away if you are not doing well or get worse. Document Released: 11/19/2005 Document Revised: 05/20/2012 Document Reviewed: 02/28/2012 [facility] Patient Information 2015 [place], [place]. This information is not intended to replace advice given to you by your health care provider. Make sure you discuss any questions you have with your health care  provider.

## 2015-06-29 NOTE — ED Provider Notes (Signed)
CSN: [id number]     Arrival date & time 06/28/15  1950 History   First MD Initiated Contact with Patient 06/29/15 0005     Chief Complaint  Patient presents with  . Abdominal Pain     (Consider location/radiation/quality/duration/timing/severity/associated sxs/prior Treatment) HPI Comments: Patient is a 39 yo F history of chronic pancreatitis status post subtotal pancreatectomy. She is here with epigastric abdominal pain that began yesterday afternoon after 3PM. The onset was gradual but became severe. She rates her pain 10/10 currently. She tried her at-home Norco with no improvement. She states she had multiple episodes of nonbloody nonbilious emesis and nausea without fever or diarrhea. No modifying factors identified. Abdominal surgical history includes whipple surgery.   Patient is a 39 y.o. female presenting with abdominal pain. The history is provided by the patient.  Abdominal Pain Pain location:  Epigastric Pain radiates to:  Does not radiate Pain severity:  Severe Onset quality:  Gradual Duration:  1 day Timing:  Constant Progression:  Worsening Chronicity:  Recurrent Relieved by:  Nothing Worsened by:  Nothing tried Associated symptoms: nausea and vomiting   Associated symptoms: no diarrhea and no fever     Past Medical History  Diagnosis Date  . Pancreatitis chronic   . Asthma   . Diabetes mellitus   . Fibromyalgia   . Migraines   . Hyperlipidemia   . IBS (irritable bowel syndrome)   . Chronic sinusitis   . Migraine without aura, with intractable migraine, so stated, without mention of status migrainosus 01/25/2014  . Vulvar vestibulitis   . Sjogren's syndrome    Past Surgical History  Procedure Laterality Date  . Whiple  1997  . Breast reduction surgery  2010  . Dilation and curettage of uterus  2007  . Whipple's    . Pancreas surgery  1997   Family History  Problem Relation Age of Onset  . Asthma Brother   . Asthma Mother   . Pancreatic cancer Other    . Pancreatitis Paternal Grandfather   . Pancreatitis Maternal Uncle   . Migraines Sister    History  Substance Use Topics  . Smoking status: Never Smoker   . Smokeless tobacco: Never Used  . Alcohol Use: No   OB History    No data available     Review of Systems  Constitutional: Negative for fever.  Gastrointestinal: Positive for nausea, vomiting and abdominal pain. Negative for diarrhea.  All other systems reviewed and are negative.     Allergies  Nortriptyline hcl; Amitriptyline; Amoxicillin; Compazine; Cymbalta; Dexilant; Doxycycline; Flagyl; Penicillins cross reactors; Phenergan; Topamax; Inapsine; Ketamine; Levofloxacin; Lyrica; Phenothiazines; Primaxin; Prozac; and Reglan  Home Medications   Prior to Admission medications   Medication Sig Start Date End Date Taking? Authorizing Provider  albuterol (PROVENTIL HFA;VENTOLIN HFA) 108 (90 BASE) MCG/ACT inhaler Inhale 2 puffs into the lungs every 6 (six) hours as needed. Asthma   Yes Historical Provider, MD  beclomethasone (QVAR) 80 MCG/ACT inhaler Inhale 1 puff into the lungs 2 (two) times daily.    Yes Historical Provider, MD  calcium carbonate (TUMS EX) 750 MG chewable tablet Chew 2-4 tablets by mouth as needed. No more than 10 a day   Yes Historical Provider, MD  HYDROcodone-acetaminophen (NORCO/VICODIN) 5-325 MG per tablet Take 1 tablet by mouth every 6 (six) hours as needed for moderate pain.   Yes Historical Provider, MD  ibuprofen (ADVIL,MOTRIN) 200 MG tablet Take 400 mg by mouth every 6 (six) hours as needed for moderate  pain.    Yes Historical Provider, MD  insulin aspart (NOVOLOG) 100 UNIT/ML injection Inject into the skin. 5-6 times daily with meals and extra if needed   Yes Historical Provider, MD  insulin glargine (LANTUS) 100 UNIT/ML injection Inject 19 Units into the skin 2 (two) times daily.    Yes Historical Provider, MD  LORazepam (ATIVAN) 1 MG tablet ONE HALF to 1 tab PO BID prn anxiety Patient taking  differently: Take 0.5-1 mg by mouth 2 (two) times daily as needed for anxiety. ONE HALF to 1 tab PO BID prn anxiety 06/22/14  Yes Samuel Jester, DO  Melatonin 5 MG TABS Take 1 tablet by mouth at bedtime.   Yes Historical Provider, MD  Multiple Vitamins-Calcium (VIACTIV MULTI-VITAMIN) CHEW Chew 1 each by mouth daily.   Yes Historical Provider, MD  omeprazole (PRILOSEC) 20 MG capsule Take 20 mg by mouth daily.   Yes Historical Provider, MD  ondansetron (ZOFRAN) 4 MG tablet Take 4 mg by mouth as needed for nausea. As needed 08/21/14  Yes Historical Provider, MD  Pancrelipase, Lip-Prot-Amyl, (ZENPEP PO) Take 3 capsules by mouth 3 (three) times daily. 1 capsule with snacks.   Yes Historical Provider, MD  polyethylene glycol (MIRALAX / GLYCOLAX) packet Take 17 g by mouth daily as needed for mild constipation.   Yes Historical Provider, MD  Probiotic Product (PROBIOTIC DAILY PO) Take 1 capsule by mouth daily. ulta flora adult probiotic 1 daily   Yes Historical Provider, MD  rizatriptan (MAXALT) 10 MG tablet Take 10 mg by mouth as needed for migraine. May repeat in 2 hours if needed for headaches   Yes Historical Provider, MD  ondansetron (ZOFRAN ODT) 4 MG disintegrating tablet Take 1 tablet (4 mg total) by mouth every 8 (eight) hours as needed for nausea. 06/29/15   Tobie Hellen, PA-C   BP 98/63 mmHg  Pulse 76  Temp(Src) 98.4 F (36.9 C) (Oral)  Resp 18  SpO2 99%  LMP 06/14/2015 (Approximate) Physical Exam  Constitutional: She is oriented to person, place, and time. She appears well-developed and well-nourished.  Uncomfortable appearing.   HENT:  Head: Normocephalic and atraumatic.  Eyes: Conjunctivae are normal.  Neck: Neck supple.  Cardiovascular: Regular rhythm and normal heart sounds.   Pulmonary/Chest: Effort normal and breath sounds normal.  Abdominal: Soft. Bowel sounds are normal. There is tenderness in the epigastric area. There is no rigidity.  Neurological: She is alert and  oriented to person, place, and time.  Skin: Skin is warm and dry.  Nursing note and vitals reviewed.   ED Course  Procedures (including critical care time) Medications  sodium chloride 0.9 % bolus 1,000 mL (0 mLs Intravenous Stopped 06/29/15 0245)  ondansetron (ZOFRAN) injection 4 mg (4 mg Intravenous Given 06/29/15 0114)  HYDROmorphone (DILAUDID) injection 1 mg (1 mg Intravenous Given 06/29/15 0114)  HYDROmorphone (DILAUDID) injection 1 mg (1 mg Intravenous Given 06/29/15 0243)  ondansetron (ZOFRAN) injection 4 mg (4 mg Intravenous Given 06/29/15 0243)  HYDROmorphone (DILAUDID) injection 1 mg (1 mg Intravenous Given 06/29/15 0411)  ondansetron (ZOFRAN) injection 2 mg (2 mg Intravenous Given 06/29/15 0526)    Labs Review Labs Reviewed  LIPASE, BLOOD - Abnormal; Notable for the following:    Lipase <10 (*)    All other components within normal limits  COMPREHENSIVE METABOLIC PANEL - Abnormal; Notable for the following:    Glucose, Bld 158 (*)    All other components within normal limits  CBC - Abnormal; Notable for the following:  WBC 12.9 (*)    Hemoglobin 11.2 (*)    HCT 34.5 (*)    MCV 75.0 (*)    MCH 24.3 (*)    RDW 15.9 (*)    Platelets 436 (*)    All other components within normal limits  URINALYSIS, ROUTINE W REFLEX MICROSCOPIC (NOT AT [facility]) - Abnormal; Notable for the following:    Ketones, ur >80 (*)    All other components within normal limits  I-STAT BETA HCG BLOOD, ED ([facility], [facility], [facility] ONLY)    Imaging Review No results found.   EKG Interpretation None      4:47 AM Patient re-evaluated, pain improved to 4/10. Patient sitting up right asking to try a sip of something to drink, would like to go home if able to tolerate PO intake. Gingerale given.   MDM   Final diagnoses:  Chronic pancreatitis, unspecified pancreatitis type    Filed Vitals:   06/29/15 0525  BP: 98/63  Pulse: 76  Temp:   Resp: 18   Afebrile, NAD, non-toxic appearing, AAOx4.  I have reviewed  nursing notes, vital signs, and all lab results as noted above.  Abdomen soft, tender in epigastric region without peritoneal signs. Patient is uncomfortable appearing. Labs obtained and reviewed. IV fluids, pain medication and nausea medicine given.  The patient's pain is reasonably well-controlled at this time, requesting to be discharged home. Patient is able to tolerate by mouth intake. Abdominal exam remains benign. Will discharge home with advised follow-up with gastroenterology.  Patient is stable at time of discharge   Francee Piccolo, PA-C 06/29/15 0550  Marisa Severin, MD 06/30/15 [phone]

## 2015-09-20 ENCOUNTER — Emergency Department (HOSPITAL_COMMUNITY): Payer: BLUE CROSS/BLUE SHIELD

## 2015-09-20 ENCOUNTER — Emergency Department (HOSPITAL_COMMUNITY)
Admission: EM | Admit: 2015-09-20 | Discharge: 2015-09-20 | Disposition: A | Discharge status: Home / Self Care | Payer: BLUE CROSS/BLUE SHIELD | Attending: Emergency Medicine | Admitting: Emergency Medicine

## 2015-09-20 ENCOUNTER — Encounter (HOSPITAL_COMMUNITY): Payer: Self-pay | Admitting: Emergency Medicine

## 2015-09-20 DIAGNOSIS — Z8719 Personal history of other diseases of the digestive system: Secondary | ICD-10-CM | POA: Insufficient documentation

## 2015-09-20 DIAGNOSIS — J45909 Unspecified asthma, uncomplicated: Secondary | ICD-10-CM | POA: Diagnosis not present

## 2015-09-20 DIAGNOSIS — Z880 Allergy status to penicillin: Secondary | ICD-10-CM | POA: Insufficient documentation

## 2015-09-20 DIAGNOSIS — E119 Type 2 diabetes mellitus without complications: Secondary | ICD-10-CM | POA: Diagnosis not present

## 2015-09-20 DIAGNOSIS — Z8739 Personal history of other diseases of the musculoskeletal system and connective tissue: Secondary | ICD-10-CM | POA: Insufficient documentation

## 2015-09-20 DIAGNOSIS — Z8742 Personal history of other diseases of the female genital tract: Secondary | ICD-10-CM | POA: Diagnosis not present

## 2015-09-20 DIAGNOSIS — R079 Chest pain, unspecified: Secondary | ICD-10-CM | POA: Insufficient documentation

## 2015-09-20 DIAGNOSIS — G43909 Migraine, unspecified, not intractable, without status migrainosus: Secondary | ICD-10-CM | POA: Insufficient documentation

## 2015-09-20 DIAGNOSIS — R1013 Epigastric pain: Secondary | ICD-10-CM | POA: Insufficient documentation

## 2015-09-20 DIAGNOSIS — Z794 Long term (current) use of insulin: Secondary | ICD-10-CM | POA: Insufficient documentation

## 2015-09-20 DIAGNOSIS — Z79899 Other long term (current) drug therapy: Secondary | ICD-10-CM | POA: Diagnosis not present

## 2015-09-20 DIAGNOSIS — Z7951 Long term (current) use of inhaled steroids: Secondary | ICD-10-CM | POA: Diagnosis not present

## 2015-09-20 LAB — BASIC METABOLIC PANEL
Anion gap: 7 (ref 5–15)
BUN: 15 mg/dL (ref 6–20)
CHLORIDE: 106 mmol/L (ref 101–111)
CO2: 23 mmol/L (ref 22–32)
Calcium: 8.7 mg/dL — ABNORMAL LOW (ref 8.9–10.3)
Creatinine, Ser: 0.67 mg/dL (ref 0.44–1.00)
GFR calc Af Amer: >60 mL/min (ref >60)
GFR calc non Af Amer: >60 mL/min (ref >60)
Glucose, Bld: 232 mg/dL — ABNORMAL HIGH (ref 65–99)
POTASSIUM: 3.9 mmol/L (ref 3.5–5.1)
Sodium: 136 mmol/L (ref 135–145)

## 2015-09-20 LAB — CBC
HEMATOCRIT: 32.3 % — AB (ref 36.0–46.0)
Hemoglobin: 10.2 g/dL — ABNORMAL LOW (ref 12.0–15.0)
MCH: 23.3 pg — ABNORMAL LOW (ref 26.0–34.0)
MCHC: 31.6 g/dL (ref 30.0–36.0)
MCV: 73.9 fL — AB (ref 78.0–100.0)
Platelets: 430 10*3/uL — ABNORMAL HIGH (ref 150–400)
RBC: 4.37 MIL/uL (ref 3.87–5.11)
RDW: 15.7 % — ABNORMAL HIGH (ref 11.5–15.5)
WBC: 13.6 10*3/uL — AB (ref 4.0–10.5)

## 2015-09-20 LAB — HEPATIC FUNCTION PANEL
ALT: 23 U/L (ref 14–54)
AST: 22 U/L (ref 15–41)
Albumin: 3.9 g/dL (ref 3.5–5.0)
Alkaline Phosphatase: 95 U/L (ref 38–126)
Bilirubin, Direct: <0.1 mg/dL — ABNORMAL LOW (ref 0.1–0.5)
Total Bilirubin: 0.5 mg/dL (ref 0.3–1.2)
Total Protein: 7.9 g/dL (ref 6.5–8.1)

## 2015-09-20 LAB — LIPASE, BLOOD: LIPASE: 13 U/L — AB (ref 22–51)

## 2015-09-20 LAB — D-DIMER, QUANTITATIVE (NOT AT ARMC)

## 2015-09-20 LAB — I-STAT TROPONIN, ED: Troponin i, poc: 0 ng/mL (ref 0.00–0.08)

## 2015-09-20 MED ORDER — GI COCKTAIL ~~LOC~~
30.0000 mL | Freq: Once | ORAL | Status: AC
  Administered 2015-09-20: 30 mL via ORAL
  Filled 2015-09-20: qty 30

## 2015-09-20 NOTE — Discharge Instructions (Signed)
Nonspecific Chest Pain  °Chest pain can be caused by many different conditions. There is always a chance that your pain could be related to something serious, such as a heart attack or a blood clot in your lungs. Chest pain can also be caused by conditions that are not life-threatening. If you have chest pain, it is very important to follow up with your health care provider. °CAUSES  °Chest pain can be caused by: °· Heartburn. °· Pneumonia or bronchitis. °· Anxiety or stress. °· Inflammation around your heart (pericarditis) or lung (pleuritis or pleurisy). °· A blood clot in your lung. °· A collapsed lung (pneumothorax). It can develop suddenly on its own (spontaneous pneumothorax) or from trauma to the chest. °· Shingles infection (varicella-zoster virus). °· Heart attack. °· Damage to the bones, muscles, and cartilage that make up your chest wall. This can include: °¨ Bruised bones due to injury. °¨ Strained muscles or cartilage due to frequent or repeated coughing or overwork. °¨ Fracture to one or more ribs. °¨ Sore cartilage due to inflammation (costochondritis). °RISK FACTORS  °Risk factors for chest pain may include: °· Activities that increase your risk for trauma or injury to your chest. °· Respiratory infections or conditions that cause frequent coughing. °· Medical conditions or overeating that can cause heartburn. °· Heart disease or family history of heart disease. °· Conditions or health behaviors that increase your risk of developing a blood clot. °· Having had chicken pox (varicella zoster). °SIGNS AND SYMPTOMS °Chest pain can feel like: °· Burning or tingling on the surface of your chest or deep in your chest. °· Crushing, pressure, aching, or squeezing pain. °· Dull or sharp pain that is worse when you move, cough, or take a deep breath. °· Pain that is also felt in your back, neck, shoulder, or arm, or pain that spreads to any of these areas. °Your chest pain may come and go, or it may stay  constant. °DIAGNOSIS °Lab tests or other studies may be needed to find the cause of your pain. Your health care provider may have you take a test called an ambulatory ECG (electrocardiogram). An ECG records your heartbeat patterns at the time the test is performed. You may also have other tests, such as: °· Transthoracic echocardiogram (TTE). During echocardiography, sound waves are used to create a picture of all of the heart structures and to look at how blood flows through your heart. °· Transesophageal echocardiogram (TEE). This is a more advanced imaging test that obtains images from inside your body. It allows your health care provider to see your heart in finer detail. °· Cardiac monitoring. This allows your health care provider to monitor your heart rate and rhythm in real time. °· Holter monitor. This is a portable device that records your heartbeat and can help to diagnose abnormal heartbeats. It allows your health care provider to track your heart activity for several days, if needed. °· Stress tests. These can be done through exercise or by taking medicine that makes your heart beat more quickly. °· Blood tests. °· Imaging tests. °TREATMENT  °Your treatment depends on what is causing your chest pain. Treatment may include: °· Medicines. These may include: °¨ Acid blockers for heartburn. °¨ Anti-inflammatory medicine. °¨ Pain medicine for inflammatory conditions. °¨ Antibiotic medicine, if an infection is present. °¨ Medicines to dissolve blood clots. °¨ Medicines to treat coronary artery disease. °· Supportive care for conditions that do not require medicines. This may include: °¨ Resting. °¨ Applying heat   or cold packs to injured areas. °¨ Limiting activities until pain decreases. °HOME CARE INSTRUCTIONS °· If you were prescribed an antibiotic medicine, finish it all even if you start to feel better. °· Avoid any activities that bring on chest pain. °· Do not use any tobacco products, including  cigarettes, chewing tobacco, or electronic cigarettes. If you need help quitting, ask your health care provider. °· Do not drink alcohol. °· Take medicines only as directed by your health care provider. °· Keep all follow-up visits as directed by your health care provider. This is important. This includes any further testing if your chest pain does not go away. °· If heartburn is the cause for your chest pain, you may be told to keep your head raised (elevated) while sleeping. This reduces the chance that acid will go from your stomach into your esophagus. °· Make lifestyle changes as directed by your health care provider. These may include: °¨ Getting regular exercise. Ask your health care provider to suggest some activities that are safe for you. °¨ Eating a heart-healthy diet. A registered dietitian can help you to learn healthy eating options. °¨ Maintaining a healthy weight. °¨ Managing diabetes, if necessary. °¨ Reducing stress. °SEEK MEDICAL CARE IF: °· Your chest pain does not go away after treatment. °· You have a rash with blisters on your chest. °· You have a fever. °SEEK IMMEDIATE MEDICAL CARE IF:  °· Your chest pain is worse. °· You have an increasing cough, or you cough up blood. °· You have severe abdominal pain. °· You have severe weakness. °· You faint. °· You have chills. °· You have sudden, unexplained chest discomfort. °· You have sudden, unexplained discomfort in your arms, back, neck, or jaw. °· You have shortness of breath at any time. °· You suddenly start to sweat, or your skin gets clammy. °· You feel nauseous or you vomit. °· You suddenly feel light-headed or dizzy. °· Your heart begins to beat quickly, or it feels like it is skipping beats. °These symptoms may represent a serious problem that is an emergency. Do not wait to see if the symptoms will go away. Get medical help right away. Call your local emergency services (911 in the [place] Do not drive yourself to the hospital. °  °This  information is not intended to replace advice given to you by your health care provider. Make sure you discuss any questions you have with your health care provider. °  °Document Released: 08/29/2005 Document Revised: 12/10/2014 Document Reviewed: 06/25/2014 °[employer] Interactive Patient Education ©2016 [employer] ° °

## 2015-09-20 NOTE — ED Notes (Addendum)
AVS explained in detail. Knows to follow up with PCP if chest pain continues and to alter dietary caffeine intake to see if chest pain decreases. No other c/c. Ambulatory with steady gait. A&Ox4.

## 2015-09-20 NOTE — ED Provider Notes (Signed)
CSN: [id number]     Arrival date & time 09/20/15  1712 History   First MD Initiated Contact with Patient 09/20/15 1742     Chief Complaint  Patient presents with  . Chest Pain     (Consider location/radiation/quality/duration/timing/severity/associated sxs/prior Treatment) HPI Comments: Pt comes in with c/o mid sternal cp radiating to the right side over the last 2 weeks. She denies fever, cough or vomiting. She states that she does feel sob. She called her doctor to be seen today and they told her to come here. No family history of heart disease. Never been a smoker or done drugs. Pt thought it was related to asthma but it isn't resolving. She has also tried to take antiacids without much relief. She has a history of pancreatitis but this doesn't feel similar.pain is worse with bending over.  The history is provided by the patient. No language interpreter was used.    Past Medical History  Diagnosis Date  . Pancreatitis chronic   . Asthma   . Diabetes mellitus   . Fibromyalgia   . Migraines   . Hyperlipidemia   . IBS (irritable bowel syndrome)   . Chronic sinusitis   . Migraine without aura, with intractable migraine, so stated, without mention of status migrainosus 01/25/2014  . Vulvar vestibulitis   . Sjogren's syndrome [facility])    Past Surgical History  Procedure Laterality Date  . Whiple  1997  . Breast reduction surgery  2010  . Dilation and curettage of uterus  2007  . Whipple's    . Pancreas surgery  1997   Family History  Problem Relation Age of Onset  . Asthma Brother   . Asthma Mother   . Pancreatic cancer Other   . Pancreatitis Paternal Grandfather   . Pancreatitis Maternal Uncle   . Migraines Sister    Social History  Substance Use Topics  . Smoking status: Never Smoker   . Smokeless tobacco: Never Used  . Alcohol Use: No   OB History    No data available     Review of Systems  All other systems reviewed and are negative.     Allergies   Nortriptyline hcl; Amitriptyline; Compazine; Cymbalta; Dexilant; Doxycycline; Flagyl; Penicillins cross reactors; Topamax; Amoxicillin; Inapsine; Ketamine; Levofloxacin; Lyrica; Phenergan; Phenothiazines; Primaxin; Prozac; and Reglan  Home Medications   Prior to Admission medications   Medication Sig Start Date End Date Taking? Authorizing Provider  LORazepam (ATIVAN) 1 MG tablet ONE HALF to 1 tab PO BID prn anxiety Patient taking differently: Take 0.5-1 mg by mouth 2 (two) times daily as needed for anxiety. ONE HALF to 1 tab PO BID prn anxiety 06/22/14  Yes Samuel Jester, DO  albuterol (PROVENTIL HFA;VENTOLIN HFA) 108 (90 BASE) MCG/ACT inhaler Inhale 2 puffs into the lungs every 6 (six) hours as needed. Asthma    Historical Provider, MD  beclomethasone (QVAR) 80 MCG/ACT inhaler Inhale 1 puff into the lungs 2 (two) times daily.     Historical Provider, MD  calcium carbonate (TUMS EX) 750 MG chewable tablet Chew 2-4 tablets by mouth as needed. No more than 10 a day    Historical Provider, MD  HYDROcodone-acetaminophen (NORCO/VICODIN) 5-325 MG per tablet Take 1 tablet by mouth every 6 (six) hours as needed for moderate pain.    Historical Provider, MD  ibuprofen (ADVIL,MOTRIN) 200 MG tablet Take 400 mg by mouth every 6 (six) hours as needed for moderate pain.     Historical Provider, MD  insulin aspart (NOVOLOG)  100 UNIT/ML injection Inject into the skin. 5-6 times daily with meals and extra if needed    Historical Provider, MD  insulin glargine (LANTUS) 100 UNIT/ML injection Inject 19 Units into the skin 2 (two) times daily.     Historical Provider, MD  Melatonin 5 MG TABS Take 1 tablet by mouth at bedtime.    Historical Provider, MD  Multiple Vitamins-Calcium (VIACTIV MULTI-VITAMIN) CHEW Chew 1 each by mouth daily.    Historical Provider, MD  omeprazole (PRILOSEC) 20 MG capsule Take 20 mg by mouth daily.    Historical Provider, MD  ondansetron (ZOFRAN ODT) 4 MG disintegrating tablet Take 1  tablet (4 mg total) by mouth every 8 (eight) hours as needed for nausea. 06/29/15   Jennifer Piepenbrink, PA-C  ondansetron (ZOFRAN) 4 MG tablet Take 4 mg by mouth as needed for nausea. As needed 08/21/14   Historical Provider, MD  Pancrelipase, Lip-Prot-Amyl, (ZENPEP PO) Take 3 capsules by mouth 3 (three) times daily. 1 capsule with snacks.    Historical Provider, MD  polyethylene glycol (MIRALAX / GLYCOLAX) packet Take 17 g by mouth daily as needed for mild constipation.    Historical Provider, MD  Probiotic Product (PROBIOTIC DAILY PO) Take 1 capsule by mouth daily. ulta flora adult probiotic 1 daily    Historical Provider, MD  rizatriptan (MAXALT) 10 MG tablet Take 10 mg by mouth as needed for migraine. May repeat in 2 hours if needed for headaches    Historical Provider, MD   LMP 09/01/2015 Physical Exam  Constitutional: She is oriented to person, place, and time. She appears well-developed and well-nourished.  Cardiovascular: Normal rate and regular rhythm.   Pulmonary/Chest: Effort normal and breath sounds normal.  Abdominal: Soft. Bowel sounds are normal. There is tenderness in the epigastric area.  Neurological: She is alert and oriented to person, place, and time.  Skin: Skin is warm.  Psychiatric: She has a normal mood and affect.  Nursing note and vitals reviewed.   ED Course  Procedures (including critical care time) Labs Review Labs Reviewed  BASIC METABOLIC PANEL - Abnormal; Notable for the following:    Glucose, Bld 232 (*)    Calcium 8.7 (*)    All other components within normal limits  CBC - Abnormal; Notable for the following:    WBC 13.6 (*)    Hemoglobin 10.2 (*)    HCT 32.3 (*)    MCV 73.9 (*)    MCH 23.3 (*)    RDW 15.7 (*)    Platelets 430 (*)    All other components within normal limits  LIPASE, BLOOD - Abnormal; Notable for the following:    Lipase 13 (*)    All other components within normal limits  HEPATIC FUNCTION PANEL - Abnormal; Notable for the  following:    Bilirubin, Direct <0.1 (*)    All other components within normal limits  D-DIMER, QUANTITATIVE (NOT AT [facility])  Rosezena Sensor, ED    Imaging Review Dg Chest 2 View  09/20/2015  CLINICAL DATA:  Shortness of breath.  Right chest pain.  Asthma. EXAM: CHEST  2 VIEW COMPARISON:  07/08/2015 FINDINGS: The heart size and mediastinal contours are within normal limits. Both lungs are clear. The visualized skeletal structures are unremarkable. IMPRESSION: No active cardiopulmonary disease. Electronically Signed   By: Gaylyn Rong M.D.   On: 09/20/2015 17:41   I have personally reviewed and evaluated these images and lab results as part of my medical decision-making.   EKG Interpretation  Date/Time:  Tuesday September 20 2015 17:24:08 EDT Ventricular Rate:  107 PR Interval:  140 QRS Duration: 87 QT Interval:  334 QTC Calculation: 446 R Axis:   59 Text Interpretation:  Sinus tachycardia Probable left atrial enlargement  Low voltage, precordial leads no significant change besides tachycardia  when compared to 2015 Confirmed by GOLDSTON  MD, SCOTT ([id number]) on  09/20/2015 6:03:42 PM      MDM   Final diagnoses:  Chest pain, unspecified chest pain type    Pt is okay to follow up pcp. Doubt acs.discussed return and follow up precautions with pt    Teressa LowerVrinda Klara Stjames, NP 09/21/15 0004  Pricilla LovelessScott Goldston, MD 09/21/15 2357

## 2015-09-20 NOTE — ED Notes (Signed)
Per pt, states she has been having mid chest pain radiating to the right-states history of asthma

## 2015-11-18 ENCOUNTER — Institutional Professional Consult (permissible substitution): Payer: BLUE CROSS/BLUE SHIELD | Admitting: Pulmonary Disease

## 2015-11-21 ENCOUNTER — Institutional Professional Consult (permissible substitution): Payer: BLUE CROSS/BLUE SHIELD | Admitting: Pulmonary Disease

## 2015-12-19 ENCOUNTER — Ambulatory Visit: Payer: BLUE CROSS/BLUE SHIELD | Admitting: Podiatry

## 2016-01-04 ENCOUNTER — Emergency Department (HOSPITAL_COMMUNITY): Payer: BLUE CROSS/BLUE SHIELD

## 2016-01-04 ENCOUNTER — Encounter (HOSPITAL_COMMUNITY): Payer: Self-pay | Admitting: Emergency Medicine

## 2016-01-04 ENCOUNTER — Inpatient Hospital Stay (HOSPITAL_COMMUNITY)
Admission: EM | Admit: 2016-01-04 | Discharge: 2016-01-07 | DRG: 440 | Disposition: A | Discharge status: Home / Self Care | Payer: BLUE CROSS/BLUE SHIELD | Attending: Internal Medicine | Admitting: Internal Medicine

## 2016-01-04 DIAGNOSIS — E119 Type 2 diabetes mellitus without complications: Secondary | ICD-10-CM

## 2016-01-04 DIAGNOSIS — G43019 Migraine without aura, intractable, without status migrainosus: Secondary | ICD-10-CM | POA: Diagnosis present

## 2016-01-04 DIAGNOSIS — M797 Fibromyalgia: Secondary | ICD-10-CM | POA: Diagnosis present

## 2016-01-04 DIAGNOSIS — M3500 Sicca syndrome, unspecified: Secondary | ICD-10-CM | POA: Diagnosis present

## 2016-01-04 DIAGNOSIS — K859 Acute pancreatitis without necrosis or infection, unspecified: Secondary | ICD-10-CM | POA: Insufficient documentation

## 2016-01-04 DIAGNOSIS — F419 Anxiety disorder, unspecified: Secondary | ICD-10-CM | POA: Diagnosis present

## 2016-01-04 DIAGNOSIS — R110 Nausea: Secondary | ICD-10-CM

## 2016-01-04 DIAGNOSIS — R109 Unspecified abdominal pain: Secondary | ICD-10-CM

## 2016-01-04 DIAGNOSIS — Z90411 Acquired partial absence of pancreas: Secondary | ICD-10-CM

## 2016-01-04 DIAGNOSIS — D72829 Elevated white blood cell count, unspecified: Secondary | ICD-10-CM | POA: Diagnosis present

## 2016-01-04 DIAGNOSIS — D509 Iron deficiency anemia, unspecified: Secondary | ICD-10-CM | POA: Diagnosis present

## 2016-01-04 DIAGNOSIS — K8681 Exocrine pancreatic insufficiency: Secondary | ICD-10-CM | POA: Diagnosis present

## 2016-01-04 DIAGNOSIS — K861 Other chronic pancreatitis: Secondary | ICD-10-CM | POA: Diagnosis present

## 2016-01-04 DIAGNOSIS — Z79899 Other long term (current) drug therapy: Secondary | ICD-10-CM

## 2016-01-04 DIAGNOSIS — R1013 Epigastric pain: Secondary | ICD-10-CM | POA: Diagnosis not present

## 2016-01-04 DIAGNOSIS — J45909 Unspecified asthma, uncomplicated: Secondary | ICD-10-CM | POA: Diagnosis present

## 2016-01-04 DIAGNOSIS — E1165 Type 2 diabetes mellitus with hyperglycemia: Secondary | ICD-10-CM | POA: Diagnosis present

## 2016-01-04 DIAGNOSIS — Z800 Family history of malignant neoplasm of digestive organs: Secondary | ICD-10-CM

## 2016-01-04 DIAGNOSIS — E860 Dehydration: Secondary | ICD-10-CM | POA: Diagnosis present

## 2016-01-04 DIAGNOSIS — Z880 Allergy status to penicillin: Secondary | ICD-10-CM

## 2016-01-04 DIAGNOSIS — Z888 Allergy status to other drugs, medicaments and biological substances status: Secondary | ICD-10-CM

## 2016-01-04 DIAGNOSIS — K589 Irritable bowel syndrome without diarrhea: Secondary | ICD-10-CM | POA: Diagnosis present

## 2016-01-04 DIAGNOSIS — Z825 Family history of asthma and other chronic lower respiratory diseases: Secondary | ICD-10-CM

## 2016-01-04 DIAGNOSIS — Z794 Long term (current) use of insulin: Secondary | ICD-10-CM

## 2016-01-04 DIAGNOSIS — J329 Chronic sinusitis, unspecified: Secondary | ICD-10-CM | POA: Diagnosis present

## 2016-01-04 DIAGNOSIS — Z881 Allergy status to other antibiotic agents status: Secondary | ICD-10-CM

## 2016-01-04 DIAGNOSIS — E785 Hyperlipidemia, unspecified: Secondary | ICD-10-CM | POA: Diagnosis present

## 2016-01-04 LAB — CBC
HEMATOCRIT: 35.6 % — AB (ref 36.0–46.0)
HEMOGLOBIN: 10.9 g/dL — AB (ref 12.0–15.0)
MCH: 22.8 pg — ABNORMAL LOW (ref 26.0–34.0)
MCHC: 30.6 g/dL (ref 30.0–36.0)
MCV: 74.5 fL — AB (ref 78.0–100.0)
Platelets: 481 10*3/uL — ABNORMAL HIGH (ref 150–400)
RBC: 4.78 MIL/uL (ref 3.87–5.11)
RDW: 15.8 % — ABNORMAL HIGH (ref 11.5–15.5)
WBC: 18.4 10*3/uL — ABNORMAL HIGH (ref 4.0–10.5)

## 2016-01-04 LAB — URINALYSIS, ROUTINE W REFLEX MICROSCOPIC
Bilirubin Urine: NEGATIVE
Glucose, UA: 250 mg/dL — AB
Hgb urine dipstick: NEGATIVE
Ketones, ur: NEGATIVE mg/dL
LEUKOCYTES UA: NEGATIVE
NITRITE: NEGATIVE
PH: 5.5 (ref 5.0–8.0)
Protein, ur: NEGATIVE mg/dL
SPECIFIC GRAVITY, URINE: 1.024 (ref 1.005–1.030)

## 2016-01-04 LAB — COMPREHENSIVE METABOLIC PANEL
ALK PHOS: 95 U/L (ref 38–126)
ALT: 21 U/L (ref 14–54)
ANION GAP: 10 (ref 5–15)
AST: 18 U/L (ref 15–41)
Albumin: 4.2 g/dL (ref 3.5–5.0)
BUN: 14 mg/dL (ref 6–20)
CO2: 25 mmol/L (ref 22–32)
Calcium: 10.4 mg/dL — ABNORMAL HIGH (ref 8.9–10.3)
Chloride: 102 mmol/L (ref 101–111)
Creatinine, Ser: 0.7 mg/dL (ref 0.44–1.00)
GFR calc Af Amer: >60 mL/min (ref >60)
Glucose, Bld: 249 mg/dL — ABNORMAL HIGH (ref 65–99)
Potassium: 4.2 mmol/L (ref 3.5–5.1)
Sodium: 137 mmol/L (ref 135–145)
TOTAL PROTEIN: 8.2 g/dL — AB (ref 6.5–8.1)
Total Bilirubin: 0.7 mg/dL (ref 0.3–1.2)

## 2016-01-04 LAB — LIPASE, BLOOD: Lipase: 32 U/L (ref 11–51)

## 2016-01-04 LAB — PREGNANCY, URINE: Preg Test, Ur: NEGATIVE

## 2016-01-04 MED ORDER — ONDANSETRON HCL 4 MG/2ML IJ SOLN
4.0000 mg | Freq: Once | INTRAMUSCULAR | Status: AC
  Administered 2016-01-04: 4 mg via INTRAVENOUS
  Filled 2016-01-04: qty 2

## 2016-01-04 MED ORDER — HYDROMORPHONE HCL 1 MG/ML IJ SOLN
1.0000 mg | Freq: Once | INTRAMUSCULAR | Status: AC
  Administered 2016-01-04: 1 mg via INTRAVENOUS
  Filled 2016-01-04: qty 1

## 2016-01-04 MED ORDER — ONDANSETRON HCL 4 MG/2ML IJ SOLN
4.0000 mg | Freq: Once | INTRAMUSCULAR | Status: AC
  Administered 2016-01-05: 4 mg via INTRAVENOUS
  Filled 2016-01-04: qty 2

## 2016-01-04 MED ORDER — HYDROMORPHONE HCL 1 MG/ML IJ SOLN
1.0000 mg | Freq: Once | INTRAMUSCULAR | Status: AC
  Administered 2016-01-05: 1 mg via INTRAVENOUS
  Filled 2016-01-04: qty 1

## 2016-01-04 MED ORDER — SODIUM CHLORIDE 0.9 % IV BOLUS (SEPSIS)
1000.0000 mL | Freq: Once | INTRAVENOUS | Status: AC
  Administered 2016-01-04: 1000 mL via INTRAVENOUS

## 2016-01-04 NOTE — ED Notes (Signed)
Pt requesting a inhaler stating her chest feels "tight" r/t hx of asthma. Pt also requesting an antacid for her current GI Upset.

## 2016-01-04 NOTE — ED Notes (Signed)
Patient presents for upper abdominal pain onset PTA with nausea. History of pancreatitis and states this feels like a flare up. Denies vomiting, diarrhea, fever or chills.

## 2016-01-04 NOTE — ED Provider Notes (Signed)
CSN: [id number]     Arrival date & time 01/04/16  2047 History   First MD Initiated Contact with Patient 01/04/16 2132     Chief Complaint  Patient presents with  . Abdominal Pain  . Pancreatitis    Probable Excerbation     (Consider location/radiation/quality/duration/timing/severity/associated sxs/prior Treatment) The history is provided by the patient and medical records. No language interpreter was used.     Heather Wagner is a 40 y.o. female  with a hx of asthma, chronic pancreatitis s/p whipple in 2004 (on vicodin without relief), fibromyalgia, migraines, IBD, migraine, Sjogren;s syndrome, IDDM presents to the Emergency Department complaining of gradual, persistent, progressively worsening 9/10 epigastric abd pain onset this morning and worsening throughout the day. Associated symptoms include nausea without vomiting.  Nothing makes it better and nothing makes it worse.  Pt denies fever, chills, headache, neck pain, CP, SOB, dysuria, hematuria.      Past Medical History  Diagnosis Date  . Pancreatitis chronic   . Asthma   . Diabetes mellitus   . Fibromyalgia   . Migraines   . Hyperlipidemia   . IBS (irritable bowel syndrome)   . Chronic sinusitis   . Migraine without aura, with intractable migraine, so stated, without mention of status migrainosus 01/25/2014  . Vulvar vestibulitis   . Sjogren's syndrome [facility])    Past Surgical History  Procedure Laterality Date  . Whiple  1997  . Breast reduction surgery  2010  . Dilation and curettage of uterus  2007  . Whipple's    . Pancreas surgery  1997   Family History  Problem Relation Age of Onset  . Asthma Brother   . Asthma Mother   . Pancreatic cancer Other   . Pancreatitis Paternal Grandfather   . Pancreatitis Maternal Uncle   . Migraines Sister    Social History  Substance Use Topics  . Smoking status: Never Smoker   . Smokeless tobacco: Never Used  . Alcohol Use: No   OB History    No data available      Review of Systems  Constitutional: Negative for fever, diaphoresis, appetite change, fatigue and unexpected weight change.  HENT: Negative for mouth sores.   Eyes: Negative for visual disturbance.  Respiratory: Negative for cough, chest tightness, shortness of breath and wheezing.   Cardiovascular: Negative for chest pain.  Gastrointestinal: Positive for nausea and abdominal pain. Negative for vomiting, diarrhea and constipation.  Endocrine: Negative for polydipsia, polyphagia and polyuria.  Genitourinary: Negative for dysuria, urgency, frequency and hematuria.  Musculoskeletal: Negative for back pain and neck stiffness.  Skin: Negative for rash.  Allergic/Immunologic: Negative for immunocompromised state.  Neurological: Negative for syncope, light-headedness and headaches.  Hematological: Does not bruise/bleed easily.  Psychiatric/Behavioral: Negative for sleep disturbance. The patient is not nervous/anxious.       Allergies  Nortriptyline hcl; Amitriptyline; Compazine; Cymbalta; Dexilant; Doxycycline; Flagyl; Penicillins cross reactors; Topamax; Amoxicillin; Inapsine; Ketamine; Levofloxacin; Lyrica; Phenergan; Phenothiazines; Primaxin; Prozac; and Reglan  Home Medications   Prior to Admission medications   Medication Sig Start Date End Date Taking? Authorizing Provider  albuterol (PROVENTIL HFA;VENTOLIN HFA) 108 (90 BASE) MCG/ACT inhaler Inhale 1-2 puffs into the lungs every 6 (six) hours as needed for wheezing. Asthma   Yes Historical Provider, MD  beclomethasone (QVAR) 80 MCG/ACT inhaler Inhale 1 puff into the lungs 2 (two) times daily.    Yes Historical Provider, MD  calcium carbonate (TUMS EX) 750 MG chewable tablet Chew 2-4 tablets by mouth 2 (two)  times daily as needed for heartburn. No more than 10 a day   Yes Historical Provider, MD  HYDROcodone-acetaminophen (NORCO/VICODIN) 5-325 MG per tablet Take 2 tablets by mouth every 6 (six) hours as needed for moderate pain.    Yes  Historical Provider, MD  ibuprofen (ADVIL,MOTRIN) 200 MG tablet Take 400-800 mg by mouth every 8 (eight) hours as needed for moderate pain.    Yes Historical Provider, MD  insulin aspart (NOVOLOG) 100 UNIT/ML injection Inject 1-5 Units into the skin. 5-6 times daily with meals and extra if needed, injecting 1 unit for every 15 carbs over 150.   Yes Historical Provider, MD  LANTUS SOLOSTAR 100 UNIT/ML Solostar Pen Inject 19 Units into the skin 2 (two) times daily. 12/26/15  Yes Historical Provider, MD  LORazepam (ATIVAN) 1 MG tablet ONE HALF to 1 tab PO BID prn anxiety Patient taking differently: Take 0.5-1 mg by mouth 2 (two) times daily as needed for anxiety.  06/22/14  Yes Samuel Jester, DO  Melatonin 5 MG TABS Take 1 tablet by mouth at bedtime.   Yes Historical Provider, MD  Multiple Vitamins-Calcium (VIACTIV MULTI-VITAMIN) CHEW Chew 1 each by mouth daily.   Yes Historical Provider, MD  Multiple Vitamins-Minerals (ALIVE WOMENS GUMMY PO) Take 2 tablets by mouth daily.   Yes Historical Provider, MD  omeprazole (PRILOSEC) 20 MG capsule Take 20 mg by mouth every morning.    Yes Historical Provider, MD  ondansetron (ZOFRAN) 4 MG tablet Take 4 mg by mouth every 8 (eight) hours as needed for nausea. As needed 08/21/14  Yes Historical Provider, MD  OVER THE COUNTER MEDICATION Take 1 Dose by mouth at bedtime as needed (insomnia). Valarian/liqourice root tea   Yes Historical Provider, MD  polyethylene glycol (MIRALAX / GLYCOLAX) packet Take 17 g by mouth daily as needed for mild constipation.   Yes Historical Provider, MD  Probiotic Product (PROBIOTIC DAILY PO) Take 1 capsule by mouth daily. ulta flora adult probiotic 1 daily   Yes Historical Provider, MD  rizatriptan (MAXALT) 10 MG tablet Take 10 mg by mouth as needed for migraine. May repeat in 2 hours if needed for headaches   Yes Historical Provider, MD  ZENPEP 25000 units CPEP Take 1-3 capsules by mouth 3 (three) times daily with meals as needed. 3  capsules per meal and 1 capsules per snack 12/03/15  Yes Historical Provider, MD  ondansetron (ZOFRAN ODT) 4 MG disintegrating tablet Take 1 tablet (4 mg total) by mouth every 8 (eight) hours as needed for nausea. Patient not taking: Reported on 01/04/2016 06/29/15   Francee Piccolo, PA-C   BP 115/84 mmHg  Pulse 97  Temp(Src) 98.7 F (37.1 C) (Oral)  Resp 18  SpO2 100%  LMP 12/19/2015 Physical Exam  Constitutional: She appears well-developed and well-nourished. No distress.  Awake, alert, nontoxic appearance  HENT:  Head: Normocephalic and atraumatic.  Mouth/Throat: Oropharynx is clear and moist. No oropharyngeal exudate.  Eyes: Conjunctivae are normal. No scleral icterus.  Neck: Normal range of motion. Neck supple.  Cardiovascular: Normal rate, regular rhythm, normal heart sounds and intact distal pulses.   Pulmonary/Chest: Effort normal and breath sounds normal. No respiratory distress. She has no wheezes.  Equal chest expansion  Abdominal: Soft. Bowel sounds are normal. She exhibits no distension and no mass. There is no hepatosplenomegaly. There is tenderness in the epigastric area. There is guarding. There is no rebound and no CVA tenderness.  Musculoskeletal: Normal range of motion. She exhibits no edema.  Neurological:  She is alert.  Speech is clear and goal oriented Moves extremities without ataxia  Skin: Skin is warm and dry. She is not diaphoretic.  Psychiatric: She has a normal mood and affect.  Nursing note and vitals reviewed.   ED Course  Procedures (including critical care time) Labs Review Labs Reviewed  COMPREHENSIVE METABOLIC PANEL - Abnormal; Notable for the following:    Glucose, Bld 249 (*)    Calcium 10.4 (*)    Total Protein 8.2 (*)    All other components within normal limits  CBC - Abnormal; Notable for the following:    WBC 18.4 (*)    Hemoglobin 10.9 (*)    HCT 35.6 (*)    MCV 74.5 (*)    MCH 22.8 (*)    RDW 15.8 (*)    Platelets 481 (*)     All other components within normal limits  URINALYSIS, ROUTINE W REFLEX MICROSCOPIC (NOT AT [facility]) - Abnormal; Notable for the following:    Glucose, UA 250 (*)    All other components within normal limits  LIPASE, BLOOD  PREGNANCY, URINE    Imaging Review Dg Abd Acute W/chest  01/04/2016  CLINICAL DATA:  Mid abdominal pain for 24 hours EXAM: DG ABDOMEN ACUTE W/ 1V CHEST COMPARISON:  09/20/2015 FINDINGS: There is no evidence of dilated bowel loops or free intraperitoneal air. No radiopaque calculi or other significant radiographic abnormality is seen. Heart size and mediastinal contours are within normal limits. Both lungs are clear. IMPRESSION: Negative abdominal radiographs.  No acute cardiopulmonary disease. Electronically Signed   By: Signa Kell M.D.   On: 01/04/2016 23:11   I have personally reviewed and evaluated these images and lab results as part of my medical decision-making.   MDM   Final diagnoses:  Chronic pancreatitis, unspecified pancreatitis type (HCC)  Type 2 diabetes mellitus without complication, with long-term current use of insulin (HCC)  Nausea   Kayana Goering presents with acute flare of her chronic pancreatitis.  Labs are reassuring. Patient with leukocytosis of 18.4 however she has had elevated blood cell counts in the past. She is afebrile without hypotension. No emesis.  Acute abdominal series without free air or bowel obstruction.  Patient with multiple rounds of IV Dilaudid with only moderate pain control. Discussed and recommended admission for intractable pain however patient declines at this time. She wishes one additional dose and attempt to be discharged home.  1:10 AM Pt with continued pain and is receiving her third dose of pain control. At shift change care transferred to Elpidio Anis, PA-C who will reassess and determine disposition.  No further pain control be given here in the emergency department. If she continues to require IV pain  control she will need admission.  BP 115/84 mmHg  Pulse 97  Temp(Src) 98.7 F (37.1 C) (Oral)  Resp 18  SpO2 100%  LMP 12/19/2015   Dahlia Client Stpehanie Montroy, PA-C 01/05/16 0110  Melene Plan, DO 01/05/16 [phone]

## 2016-01-05 ENCOUNTER — Emergency Department (HOSPITAL_COMMUNITY): Payer: BLUE CROSS/BLUE SHIELD

## 2016-01-05 ENCOUNTER — Encounter (HOSPITAL_COMMUNITY): Payer: Self-pay | Admitting: Family Medicine

## 2016-01-05 DIAGNOSIS — K859 Acute pancreatitis without necrosis or infection, unspecified: Secondary | ICD-10-CM | POA: Insufficient documentation

## 2016-01-05 DIAGNOSIS — E785 Hyperlipidemia, unspecified: Secondary | ICD-10-CM | POA: Diagnosis present

## 2016-01-05 DIAGNOSIS — K589 Irritable bowel syndrome without diarrhea: Secondary | ICD-10-CM | POA: Diagnosis present

## 2016-01-05 DIAGNOSIS — Z800 Family history of malignant neoplasm of digestive organs: Secondary | ICD-10-CM | POA: Diagnosis not present

## 2016-01-05 DIAGNOSIS — E1165 Type 2 diabetes mellitus with hyperglycemia: Secondary | ICD-10-CM | POA: Diagnosis present

## 2016-01-05 DIAGNOSIS — R109 Unspecified abdominal pain: Secondary | ICD-10-CM | POA: Diagnosis present

## 2016-01-05 DIAGNOSIS — J4540 Moderate persistent asthma, uncomplicated: Secondary | ICD-10-CM | POA: Diagnosis not present

## 2016-01-05 DIAGNOSIS — K858 Other acute pancreatitis without necrosis or infection: Secondary | ICD-10-CM | POA: Diagnosis not present

## 2016-01-05 DIAGNOSIS — D72829 Elevated white blood cell count, unspecified: Secondary | ICD-10-CM | POA: Diagnosis not present

## 2016-01-05 DIAGNOSIS — J329 Chronic sinusitis, unspecified: Secondary | ICD-10-CM | POA: Diagnosis present

## 2016-01-05 DIAGNOSIS — Z880 Allergy status to penicillin: Secondary | ICD-10-CM | POA: Diagnosis not present

## 2016-01-05 DIAGNOSIS — M3500 Sicca syndrome, unspecified: Secondary | ICD-10-CM | POA: Diagnosis present

## 2016-01-05 DIAGNOSIS — M797 Fibromyalgia: Secondary | ICD-10-CM | POA: Diagnosis present

## 2016-01-05 DIAGNOSIS — K861 Other chronic pancreatitis: Secondary | ICD-10-CM

## 2016-01-05 DIAGNOSIS — Z888 Allergy status to other drugs, medicaments and biological substances status: Secondary | ICD-10-CM | POA: Diagnosis not present

## 2016-01-05 DIAGNOSIS — G43019 Migraine without aura, intractable, without status migrainosus: Secondary | ICD-10-CM

## 2016-01-05 DIAGNOSIS — Z881 Allergy status to other antibiotic agents status: Secondary | ICD-10-CM | POA: Diagnosis not present

## 2016-01-05 DIAGNOSIS — Z825 Family history of asthma and other chronic lower respiratory diseases: Secondary | ICD-10-CM | POA: Diagnosis not present

## 2016-01-05 DIAGNOSIS — K8681 Exocrine pancreatic insufficiency: Secondary | ICD-10-CM | POA: Diagnosis present

## 2016-01-05 DIAGNOSIS — D509 Iron deficiency anemia, unspecified: Secondary | ICD-10-CM | POA: Diagnosis present

## 2016-01-05 DIAGNOSIS — J45909 Unspecified asthma, uncomplicated: Secondary | ICD-10-CM | POA: Diagnosis present

## 2016-01-05 DIAGNOSIS — E860 Dehydration: Secondary | ICD-10-CM | POA: Diagnosis present

## 2016-01-05 DIAGNOSIS — F419 Anxiety disorder, unspecified: Secondary | ICD-10-CM | POA: Diagnosis present

## 2016-01-05 DIAGNOSIS — Z794 Long term (current) use of insulin: Secondary | ICD-10-CM | POA: Diagnosis not present

## 2016-01-05 DIAGNOSIS — R1013 Epigastric pain: Secondary | ICD-10-CM | POA: Diagnosis present

## 2016-01-05 DIAGNOSIS — Z79899 Other long term (current) drug therapy: Secondary | ICD-10-CM | POA: Diagnosis not present

## 2016-01-05 DIAGNOSIS — E119 Type 2 diabetes mellitus without complications: Secondary | ICD-10-CM

## 2016-01-05 DIAGNOSIS — Z90411 Acquired partial absence of pancreas: Secondary | ICD-10-CM | POA: Diagnosis not present

## 2016-01-05 LAB — CREATININE, SERUM: Creatinine, Ser: 0.59 mg/dL (ref 0.44–1.00)

## 2016-01-05 LAB — GLUCOSE, CAPILLARY
GLUCOSE-CAPILLARY: 142 mg/dL — AB (ref 65–99)
GLUCOSE-CAPILLARY: 86 mg/dL (ref 65–99)
Glucose-Capillary: 145 mg/dL — ABNORMAL HIGH (ref 65–99)
Glucose-Capillary: 125 mg/dL — ABNORMAL HIGH (ref 65–99)
Glucose-Capillary: 82 mg/dL (ref 65–99)

## 2016-01-05 LAB — CBC
HEMATOCRIT: 30.8 % — AB (ref 36.0–46.0)
HEMOGLOBIN: 9.5 g/dL — AB (ref 12.0–15.0)
MCH: 22.4 pg — ABNORMAL LOW (ref 26.0–34.0)
MCHC: 30.8 g/dL (ref 30.0–36.0)
MCV: 72.6 fL — ABNORMAL LOW (ref 78.0–100.0)
Platelets: 379 10*3/uL (ref 150–400)
RBC: 4.24 MIL/uL (ref 3.87–5.11)
RDW: 15.9 % — ABNORMAL HIGH (ref 11.5–15.5)
WBC: 14.2 10*3/uL — AB (ref 4.0–10.5)

## 2016-01-05 MED ORDER — ONDANSETRON HCL 4 MG PO TABS
4.0000 mg | ORAL_TABLET | ORAL | Status: DC | PRN
Start: 2016-01-05 — End: 2016-01-07

## 2016-01-05 MED ORDER — BUDESONIDE 0.25 MG/2ML IN SUSP
0.2500 mg | Freq: Two times a day (BID) | RESPIRATORY_TRACT | Status: DC
  Administered 2016-01-05 – 2016-01-06 (×4): 0.25 mg via RESPIRATORY_TRACT
  Filled 2016-01-05 (×5): qty 2

## 2016-01-05 MED ORDER — ACETAMINOPHEN 650 MG RE SUPP: 650.0000 mg | Freq: Four times a day (QID) | RECTAL | Status: DC | PRN

## 2016-01-05 MED ORDER — SENNOSIDES-DOCUSATE SODIUM 8.6-50 MG PO TABS: 1.0000 | ORAL_TABLET | Freq: Every evening | ORAL | Status: DC | PRN

## 2016-01-05 MED ORDER — ACETAMINOPHEN 325 MG PO TABS: 650.0000 mg | ORAL_TABLET | Freq: Four times a day (QID) | ORAL | Status: DC | PRN

## 2016-01-05 MED ORDER — IOHEXOL 300 MG/ML  SOLN
100.0000 mL | Freq: Once | INTRAMUSCULAR | Status: AC | PRN
  Administered 2016-01-05: 100 mL via INTRAVENOUS

## 2016-01-05 MED ORDER — SUMATRIPTAN SUCCINATE 50 MG PO TABS
50.0000 mg | ORAL_TABLET | ORAL | Status: DC | PRN
  Filled 2016-01-05: qty 1

## 2016-01-05 MED ORDER — DIPHENHYDRAMINE HCL 50 MG/ML IJ SOLN: 12.5000 mg | Freq: Four times a day (QID) | INTRAMUSCULAR | Status: DC | PRN

## 2016-01-05 MED ORDER — ENOXAPARIN SODIUM 40 MG/0.4ML ~~LOC~~ SOLN
40.0000 mg | SUBCUTANEOUS | Status: DC
  Administered 2016-01-05 – 2016-01-06 (×2): 40 mg via SUBCUTANEOUS
  Filled 2016-01-05 (×4): qty 0.4

## 2016-01-05 MED ORDER — IOHEXOL 300 MG/ML  SOLN
25.0000 mL | Freq: Once | INTRAMUSCULAR | Status: AC | PRN
  Administered 2016-01-05: 25 mL via ORAL

## 2016-01-05 MED ORDER — DEXTROSE-NACL 5-0.9 % IV SOLN
INTRAVENOUS | Status: DC
  Administered 2016-01-05 – 2016-01-06 (×2): via INTRAVENOUS

## 2016-01-05 MED ORDER — HYDROMORPHONE HCL 2 MG/ML IJ SOLN
2.0000 mg | INTRAMUSCULAR | Status: DC | PRN
  Administered 2016-01-05 – 2016-01-07 (×15): 2 mg via INTRAVENOUS
  Filled 2016-01-05 (×17): qty 1

## 2016-01-05 MED ORDER — ONDANSETRON HCL 4 MG/2ML IJ SOLN
4.0000 mg | Freq: Four times a day (QID) | INTRAMUSCULAR | Status: DC | PRN
  Filled 2016-01-05: qty 2

## 2016-01-05 MED ORDER — DIPHENHYDRAMINE HCL 12.5 MG/5ML PO ELIX: 12.5000 mg | ORAL_SOLUTION | Freq: Four times a day (QID) | ORAL | Status: DC | PRN

## 2016-01-05 MED ORDER — DEXTROSE 50 % IV SOLN
1.0000 | INTRAVENOUS | Status: AC
  Administered 2016-01-05: 25 mL via INTRAVENOUS
  Filled 2016-01-05: qty 50

## 2016-01-05 MED ORDER — HYDROMORPHONE HCL 1 MG/ML IJ SOLN
1.0000 mg | Freq: Once | INTRAMUSCULAR | Status: AC
  Administered 2016-01-05: 1 mg via INTRAVENOUS
  Filled 2016-01-05: qty 1

## 2016-01-05 MED ORDER — SODIUM CHLORIDE 0.9 % IV SOLN
INTRAVENOUS | Status: DC
  Administered 2016-01-05 (×3): via INTRAVENOUS

## 2016-01-05 MED ORDER — LORAZEPAM 2 MG/ML IJ SOLN: 0.5000 mg | Freq: Four times a day (QID) | INTRAMUSCULAR | Status: DC | PRN

## 2016-01-05 MED ORDER — ONDANSETRON HCL 4 MG/2ML IJ SOLN
4.0000 mg | Freq: Four times a day (QID) | INTRAMUSCULAR | Status: DC | PRN
  Administered 2016-01-05 (×2): 4 mg via INTRAVENOUS
  Filled 2016-01-05 (×2): qty 2

## 2016-01-05 MED ORDER — HYDROMORPHONE HCL 1 MG/ML IJ SOLN: 1.0000 mg | INTRAMUSCULAR | Status: DC | PRN

## 2016-01-05 MED ORDER — ALBUTEROL SULFATE HFA 108 (90 BASE) MCG/ACT IN AERS: 1.0000 | INHALATION_SPRAY | Freq: Four times a day (QID) | RESPIRATORY_TRACT | Status: DC | PRN

## 2016-01-05 MED ORDER — INSULIN ASPART 100 UNIT/ML ~~LOC~~ SOLN
0.0000 [IU] | SUBCUTANEOUS | Status: DC
  Administered 2016-01-05: 2 [IU] via SUBCUTANEOUS
  Administered 2016-01-06 (×3): 1 [IU] via SUBCUTANEOUS
  Administered 2016-01-07: 2 [IU] via SUBCUTANEOUS

## 2016-01-05 MED ORDER — NALOXONE HCL 0.4 MG/ML IJ SOLN: 0.4000 mg | INTRAMUSCULAR | Status: DC | PRN

## 2016-01-05 MED ORDER — HYDROMORPHONE 1 MG/ML IV SOLN
INTRAVENOUS | Status: DC
  Administered 2016-01-05: 06:00:00 via INTRAVENOUS
  Administered 2016-01-05: 2.7 mg via INTRAVENOUS
  Filled 2016-01-05: qty 25

## 2016-01-05 MED ORDER — ONDANSETRON HCL 4 MG/2ML IJ SOLN
4.0000 mg | INTRAMUSCULAR | Status: DC | PRN
  Administered 2016-01-05 – 2016-01-07 (×8): 4 mg via INTRAVENOUS
  Filled 2016-01-05 (×7): qty 2

## 2016-01-05 MED ORDER — HYDROMORPHONE HCL 1 MG/ML IJ SOLN
1.0000 mg | Freq: Once | INTRAMUSCULAR | Status: AC
Start: 2016-01-05 — End: 2016-01-05
  Administered 2016-01-05: 1 mg via INTRAVENOUS
  Filled 2016-01-05: qty 1

## 2016-01-05 MED ORDER — PANTOPRAZOLE SODIUM 40 MG IV SOLR
40.0000 mg | Freq: Two times a day (BID) | INTRAVENOUS | Status: DC
  Administered 2016-01-05 – 2016-01-07 (×5): 40 mg via INTRAVENOUS
  Filled 2016-01-05 (×7): qty 40

## 2016-01-05 MED ORDER — PANCRELIPASE (LIP-PROT-AMYL) 36000-114000 UNITS PO CPEP
2.0000 | ORAL_CAPSULE | Freq: Three times a day (TID) | ORAL | Status: DC | PRN
  Filled 2016-01-05 (×3): qty 2

## 2016-01-05 MED ORDER — ONDANSETRON HCL 4 MG PO TABS: 4.0000 mg | ORAL_TABLET | Freq: Four times a day (QID) | ORAL | Status: DC | PRN

## 2016-01-05 MED ORDER — SODIUM CHLORIDE 0.9% FLUSH: 9.0000 mL | INTRAVENOUS | Status: DC | PRN

## 2016-01-05 MED ORDER — ONDANSETRON HCL 4 MG/2ML IJ SOLN
4.0000 mg | Freq: Once | INTRAMUSCULAR | Status: AC
  Administered 2016-01-05: 4 mg via INTRAVENOUS
  Filled 2016-01-05: qty 2

## 2016-01-05 MED ORDER — ALBUTEROL SULFATE HFA 108 (90 BASE) MCG/ACT IN AERS
2.0000 | INHALATION_SPRAY | RESPIRATORY_TRACT | Status: DC | PRN
  Filled 2016-01-05: qty 6.7

## 2016-01-05 MED ORDER — SODIUM CHLORIDE 0.9 % IV SOLN
INTRAVENOUS | Status: DC
  Administered 2016-01-05: 04:00:00 via INTRAVENOUS

## 2016-01-05 MED ORDER — INSULIN GLARGINE 100 UNIT/ML ~~LOC~~ SOLN
8.0000 [IU] | Freq: Two times a day (BID) | SUBCUTANEOUS | Status: DC
  Administered 2016-01-05 – 2016-01-07 (×5): 8 [IU] via SUBCUTANEOUS
  Filled 2016-01-05 (×5): qty 0.08

## 2016-01-05 NOTE — ED Provider Notes (Signed)
Patient with h/o chronic pancreatitis Difficult to control pain Wants to go home, declined admission pending attempt to control pain H/O DM (no acidosis) PO challenging now  Plan: reassess  2:00 - resting comfortably, requesting to get up to bathroom. Consider CT abd to evaluate for inflammatory changes to pancreas. She is requesting to be observed for a little while longer. Still declines admission.  2:35 - CT scan ordered for further evaluation.  3:20 - the patient states the patient reports she cannot take the contrast without significant increase to pain. Discussed that adequate pain management has been provided and if more was needed hospital admission was the next step. She agrees to being admitted for pain control. Will provide medication dosing to move forward with CT scan, but will call to initiate admission.  Elpidio Anis, PA-C 01/05/16 1914  April Palumbo, MD 01/05/16 [phone]

## 2016-01-05 NOTE — H&P (Signed)
History and Physical  Patient Name: Heather Wagner     AOZ:[id number]    DOB: Sep 19, 1976    DOA: 01/04/2016 Referring physician: Elpidio Anis, PA-C PCP: Lonzo Cloud Landry Mellow, MD      Chief Complaint: Epigastric pain  HPI: Heather Wagner is a 40 y.o. female with a past medical history significant for hereditary pancreatitis s/p Whipple, IDDM, and IBS/migraine/fibromyalgia who presents with abdominal pain for 1 day.  The patient was in her usual state of health until this morning when she woke up with soreness in her left upper quadrant and epigastrium. This pain worsened after breakfast, and then persisted all day until this evening when it was a severe pain that she was afraid wouldn't go away and so she came to the ER. There was no vomiting, fever, diarrhea, hematochezia, melena.  In the ED, the patient was afebrile, tachycardic, and with normal blood pressure. Glucose 249, transaminases and lipase normal, WBC 18.4 K, hemoglobin 10.9.  The patient reported 8 out of 10 pain, untouched with Dilaudid 1 mg 3, and so TR[facility] were asked to evaluate for admission.     Review of Systems:  Pt complains of epigastric pain, nausea, sore throat, runny nose, constipation. All other systems negative except as just noted or noted in the history of present illness.  Allergies  Allergen Reactions  . Nortriptyline Hcl Shortness Of Breath    Swelling anxiety  . Amitriptyline Other (See Comments)    sedation  . Compazine [Prochlorperazine Maleate] Other (See Comments)    Neurological - uncontrollable eye movements  . Cymbalta [Duloxetine Hcl] Nausea And Vomiting    insomnia  . Dexilant [Dexlansoprazole] Nausea Only  . Doxycycline Nausea And Vomiting  . Flagyl [Metronidazole Hcl] Nausea And Vomiting    Skin rash  . Penicillins Cross Reactors Nausea And Vomiting    Skin rash/hives  . Topamax [Topiramate] Other (See Comments)    Mood change, anxiety, headaches  . Amoxicillin Nausea And  Vomiting and Anxiety    Has patient had a PCN reaction causing immediate rash, facial/tongue/throat swelling, SOB or lightheadedness with hypotension:  Has patient had a PCN reaction causing severe rash involving mucus membranes or skin necrosis:  Has patient had a PCN reaction that required hospitalization  Has patient had a PCN reaction occurring within the last 10 years:  If all of the above answers are "NO", then may proceed with Cephalosporin use.   Trudie Reed [Droperidol] Nausea And Vomiting  . Ketamine Anxiety    Agitation, insomnia   . Levofloxacin Palpitations and Other (See Comments)    Light headed, anxiety, blood rushing, shaking, sweating.l   . Lyrica [Pregabalin] Other (See Comments)    Mood changes- become listless, disinterested  . Phenergan [Promethazine Hcl] Anxiety  . Phenothiazines Hives, Nausea And Vomiting and Rash  . Primaxin [Imipenem] Nausea And Vomiting  . Prozac [Fluoxetine Hcl] Anxiety  . Reglan [Metoclopramide Hcl] Anxiety    Prior to Admission medications   Medication Sig Start Date End Date Taking? Authorizing Provider  albuterol (PROVENTIL HFA;VENTOLIN HFA) 108 (90 BASE) MCG/ACT inhaler Inhale 1-2 puffs into the lungs every 6 (six) hours as needed for wheezing. Asthma   Yes Historical Provider, MD  beclomethasone (QVAR) 80 MCG/ACT inhaler Inhale 1 puff into the lungs 2 (two) times daily.    Yes Historical Provider, MD  calcium carbonate (TUMS EX) 750 MG chewable tablet Chew 2-4 tablets by mouth 2 (two) times daily as needed for heartburn. No more than 10 a day  Yes Historical Provider, MD  HYDROcodone-acetaminophen (NORCO/VICODIN) 5-325 MG per tablet Take 2 tablets by mouth every 6 (six) hours as needed for moderate pain.    Yes Historical Provider, MD  ibuprofen (ADVIL,MOTRIN) 200 MG tablet Take 400-800 mg by mouth every 8 (eight) hours as needed for moderate pain.    Yes Historical Provider, MD  insulin aspart (NOVOLOG) 100 UNIT/ML injection Inject 1-5  Units into the skin. 5-6 times daily with meals and extra if needed, injecting 1 unit for every 15 carbs over 150.   Yes Historical Provider, MD  LANTUS SOLOSTAR 100 UNIT/ML Solostar Pen Inject 19 Units into the skin 2 (two) times daily. 12/26/15  Yes Historical Provider, MD  LORazepam (ATIVAN) 1 MG tablet ONE HALF to 1 tab PO BID prn anxiety Patient taking differently: Take 0.5-1 mg by mouth 2 (two) times daily as needed for anxiety.  06/22/14  Yes Samuel Jester, DO  Melatonin 5 MG TABS Take 1 tablet by mouth at bedtime.   Yes Historical Provider, MD  Multiple Vitamins-Calcium (VIACTIV MULTI-VITAMIN) CHEW Chew 1 each by mouth daily.   Yes Historical Provider, MD  Multiple Vitamins-Minerals (ALIVE WOMENS GUMMY PO) Take 2 tablets by mouth daily.   Yes Historical Provider, MD  omeprazole (PRILOSEC) 20 MG capsule Take 20 mg by mouth every morning.    Yes Historical Provider, MD  ondansetron (ZOFRAN) 4 MG tablet Take 4 mg by mouth every 8 (eight) hours as needed for nausea. As needed 08/21/14  Yes Historical Provider, MD  OVER THE COUNTER MEDICATION Take 1 Dose by mouth at bedtime as needed (insomnia). Valarian/liqourice root tea   Yes Historical Provider, MD  polyethylene glycol (MIRALAX / GLYCOLAX) packet Take 17 g by mouth daily as needed for mild constipation.   Yes Historical Provider, MD  Probiotic Product (PROBIOTIC DAILY PO) Take 1 capsule by mouth daily. ulta flora adult probiotic 1 daily   Yes Historical Provider, MD  rizatriptan (MAXALT) 10 MG tablet Take 10 mg by mouth as needed for migraine. May repeat in 2 hours if needed for headaches   Yes Historical Provider, MD  ZENPEP 25000 units CPEP Take 1-3 capsules by mouth 3 (three) times daily with meals as needed. 3 capsules per meal and 1 capsules per snack 12/03/15  Yes Historical Provider, MD  ondansetron (ZOFRAN ODT) 4 MG disintegrating tablet Take 1 tablet (4 mg total) by mouth every 8 (eight) hours as needed for nausea. Patient not taking:  Reported on 01/04/2016 06/29/15   Francee Piccolo, PA-C    Past Medical History  Diagnosis Date  . Pancreatitis chronic   . Asthma   . Diabetes mellitus   . Fibromyalgia   . Migraines   . Hyperlipidemia   . IBS (irritable bowel syndrome)   . Chronic sinusitis   . Migraine without aura, with intractable migraine, so stated, without mention of status migrainosus 01/25/2014  . Vulvar vestibulitis   . Sjogren's syndrome [facility])     Past Surgical History  Procedure Laterality Date  . Whiple  1997  . Breast reduction surgery  2010  . Dilation and curettage of uterus  2007  . Whipple's    . Pancreas surgery  1997    Family history: family history includes Asthma in her brother and mother; Migraines in her sister; Pancreatic cancer in her other; Pancreatitis in her maternal uncle and paternal grandfather.  No family history of colon cancer, diverticulitis, liver disease.  Social History: Patient lives with her husband. She is on  disability. She does not smoke. She does not use alcohol.       Physical Exam: BP 110/73 mmHg  Pulse 91  Temp(Src) 98.7 F (37.1 C) (Oral)  Resp 18  SpO2 98%  LMP 12/19/2015 General appearance: Well-developed, adult female, alert and in moderate distress from pain.   Eyes: Anicteric, conjunctiva pink, lids and lashes normal.     ENT: No nasal deformity or epistaxis, scant purulent drainage.  OP moist with posterior erythema and one vesicle/red macule on uvula.   Lymph: Shotty tender cervical lymphadenopathy. Skin: Warm and dry.  No jaundice.  No suspicious rashes or lesions. Large old abdominal scar. Cardiac: Tachycardic, regular, nl S1-S2, no murmurs appreciated.  Capillary refill is brisk.  No LE edema.  Radial and DP pulses 2+ and symmetric. Respiratory: Normal respiratory rate and rhythm.  CTAB without rales or wheezes. Abdomen: Abdomen soft without rigidity.  Marked epigastric and more LUQ TTP without guarding or mass appreciated. No ascites,  distension.   MSK: No deformities or effusions. Neuro: Sensorium intact and responding to questions, attention normal.  Speech is fluent.  Moves all extremities equally and with normal coordination.    Psych: Behavior appropriate.  Affect anxious.  No evidence of aural or visual hallucinations or delusions.       Labs on Admission:  The metabolic panel shows normal odium, potassium, bicarbonate, and renal function. Hyperglycemia. Transaminases and bilirubin are normal. The lipase is normal. The pregnancy test is negative. Urinalysis shows glucosuria without pyuria or hematuria. The complete blood count shows leukocytosis of 18.4 K/uL, microcytic anemia chronic and stable, thrombocytosis.   Radiological Exams on Admission: Personally reviewed: Dg Abd Acute W/chest 01/04/2016  Chest without focal opacity.  Normal bowel gas pattern without free air.   CT abdomen and pelvis with contrast 01/05/2016  "IMPRESSION: 1. Partial pancreatectomy with acute pancreatitis. No necrosis or collection. 2. Hepatic steatosis."    Assessment/Plan 1. Abdominal pain from acute on chronic pancreatitis:  I do suspect this is acute on chronic pancreatitis, despite lipase.   Doubt IBS to cause pain unrelieved with hydromorphone.  Likewise viral GE should be assoc with more vomiting/diarrhea.  Will image abdomen with oral/IV contrast to rule out perforation, abscess, SBO, splenic injury (has no gallbladder), but in the meantime, treat for pancreatitis.   -Hydromorphone PCA -ETCO2 monitoring -MIVF and daily BMP    2. IDDM with hyperglycemia:  -Lantus down to 8 units BID (from 19 BID usually) given NPO status -Low dose Sliding scale corrections -Check HgbA1c  3. Anemia, microcytic:  -Check ferritin, B12, folate -Trend CBC  4. Leukocytosis:  Unclear etiology.  No signs of infection that I see -Trend CBC  5. Asthma:  -Continue home Qvar daily -Albuterol as needed  6. IBS:  Currently  constipated  7. Sjogrens:  Stable clinically  8. Anxiety/migraine/fibromyalgia: -Triptan available PRN for migraine -Lorazepam PRN for anxiety -Hold PPI while NPO      DVT PPx: Lovenox Diet: NPO Consultants: None Code Status: Full Family Communication: None  Medical decision making: What exists of the patient's previous chart and CareEverywhere was reviewed in depth and the case was discussed with Elpidio Anis, PA-C. Patient seen 4:07 AM on 01/05/2016.  Disposition Plan:  I recommend admission to med surg.  Clinical condition: stable.  Suspect at this time that patient has pancreatitis and will require IVF and parenteral analgesia for >2 days.      Alberteen Sam [facility] Pager [phone]

## 2016-01-05 NOTE — Progress Notes (Signed)
Utilization Review complete 

## 2016-01-05 NOTE — Progress Notes (Signed)
Patient admitted after midnight. Please refer to admission note completed 01/05/2016.  Patient presented with worsening epigastric pain for past 24 hours prior to this admission associated with ongoing nausea and vomiting and inability to tolerate any by mouth intake. She was admitted for management of pancreatitis.  Assessment and plan:  Acute pancreatitis - Keep nothing by mouth. Stopped Dilaudid PCA and use Dilaudid 2 mg IV every 2 hours as needed for severe pain - Continue IV fluids while patient is nothing by mouth - Use Zofran as needed for nausea or vomiting.  Manson Passey [facility] [phone]

## 2016-01-06 DIAGNOSIS — R1013 Epigastric pain: Secondary | ICD-10-CM

## 2016-01-06 DIAGNOSIS — D509 Iron deficiency anemia, unspecified: Secondary | ICD-10-CM

## 2016-01-06 DIAGNOSIS — K858 Other acute pancreatitis without necrosis or infection: Secondary | ICD-10-CM

## 2016-01-06 DIAGNOSIS — D72829 Elevated white blood cell count, unspecified: Secondary | ICD-10-CM

## 2016-01-06 LAB — COMPREHENSIVE METABOLIC PANEL
ALK PHOS: 74 U/L (ref 38–126)
ALT: 17 U/L (ref 14–54)
ANION GAP: 6 (ref 5–15)
AST: 16 U/L (ref 15–41)
Albumin: 3 g/dL — ABNORMAL LOW (ref 3.5–5.0)
BUN: 7 mg/dL (ref 6–20)
CALCIUM: 8 mg/dL — AB (ref 8.9–10.3)
CO2: 24 mmol/L (ref 22–32)
CREATININE: 0.48 mg/dL (ref 0.44–1.00)
Chloride: 106 mmol/L (ref 101–111)
Glucose, Bld: 169 mg/dL — ABNORMAL HIGH (ref 65–99)
Potassium: 3.9 mmol/L (ref 3.5–5.1)
Sodium: 136 mmol/L (ref 135–145)
TOTAL PROTEIN: 6.3 g/dL — AB (ref 6.5–8.1)
Total Bilirubin: 0.9 mg/dL (ref 0.3–1.2)

## 2016-01-06 LAB — CBC
HCT: 28.7 % — ABNORMAL LOW (ref 36.0–46.0)
Hemoglobin: 8.9 g/dL — ABNORMAL LOW (ref 12.0–15.0)
MCH: 23.2 pg — ABNORMAL LOW (ref 26.0–34.0)
MCHC: 31 g/dL (ref 30.0–36.0)
MCV: 74.9 fL — ABNORMAL LOW (ref 78.0–100.0)
PLATELETS: 351 10*3/uL (ref 150–400)
RBC: 3.83 MIL/uL — AB (ref 3.87–5.11)
RDW: 16.3 % — ABNORMAL HIGH (ref 11.5–15.5)
WBC: 12.1 10*3/uL — AB (ref 4.0–10.5)

## 2016-01-06 LAB — GLUCOSE, CAPILLARY
GLUCOSE-CAPILLARY: 75 mg/dL (ref 65–99)
GLUCOSE-CAPILLARY: 141 mg/dL — AB (ref 65–99)
Glucose-Capillary: 128 mg/dL — ABNORMAL HIGH (ref 65–99)
Glucose-Capillary: 143 mg/dL — ABNORMAL HIGH (ref 65–99)
Glucose-Capillary: 166 mg/dL — ABNORMAL HIGH (ref 65–99)
Glucose-Capillary: 166 mg/dL — ABNORMAL HIGH (ref 65–99)
Glucose-Capillary: 168 mg/dL — ABNORMAL HIGH (ref 65–99)

## 2016-01-06 LAB — HEMOGLOBIN A1C
Hgb A1c MFr Bld: 8.9 % — ABNORMAL HIGH (ref 4.8–5.6)
Mean Plasma Glucose: 209 mg/dL

## 2016-01-06 LAB — FERRITIN: FERRITIN: 10 ng/mL — AB (ref 11–307)

## 2016-01-06 LAB — VITAMIN B12: VITAMIN B 12: 350 pg/mL (ref 180–914)

## 2016-01-06 MED ORDER — ALBUTEROL SULFATE (2.5 MG/3ML) 0.083% IN NEBU
2.5000 mg | INHALATION_SOLUTION | Freq: Two times a day (BID) | RESPIRATORY_TRACT | Status: DC
  Administered 2016-01-06: 2.5 mg via RESPIRATORY_TRACT
  Filled 2016-01-06 (×2): qty 3

## 2016-01-06 NOTE — Progress Notes (Addendum)
Patient ID: Heather Wagner, female   DOB: 04/29/1976, 40 y.o.   MRN: [id number] [facility] PROGRESS NOTE  Heather Wagner [id number]:[id number] DOB: [birth year] DOA: 01/04/2016 PCP: Tommy Rainwater, MD  Brief narrative:    40 year old female with past medical history of diabetes mellitus on insulin, chronic pancreatitis status post partial pancreatectomy who presented with worsening epigastric pain for past 24 hours prior to this admission associated with ongoing nausea and vomiting and inability to tolerate any by mouth intake. She was admitted for management of pancreatitis.  Assessment/Plan:    Principal problem: Epigastric pain / acute on chronic pancreatitis / leukocytosis - In patient with past medical history of partial pancreatectomy. CT scan confirmed acute pancreatitis - Lipase was 32 on the admission likely artificially low because of history of pancreatectomy - Continue supportive care with IV fluids for hydration - She is currently nothing by mouth and will continue nothing by mouth until later on tonight when we may advance the diet to clear liquids if patient feels she can tolerate and - Continue Protonix 40 mg IV every 12 hours - Continue pain management efforts - Continue antiemetics as needed  Active Problems:   Uncontrolled, type 2 diabetes mellitus without complication, with long-term current use of insulin (HCC) - She is on sliding scale insulin - A1c on this admission is 8.9, slightly above the goal range for diabetic patient    Hypercalcemia - Likely from dehydration - Has improved with IV fluids    Microcytic anemia - Hemoglobin stable at 10.9  DVT Prophylaxis  - Lovenox subcutaneous   Code Status: Full.  Family Communication:  plan of care discussed with the patient Disposition Plan: Home once tolerates solid diet  IV access:  Peripheral IV  Procedures and diagnostic studies:    Ct Abdomen Pelvis W Contrast 01/05/2016   1. Partial  pancreatectomy with acute pancreatitis. No necrosis or collection. 2. Hepatic steatosis. Electronically Signed   By: Marnee Spring M.D.   On: 01/05/2016 04:49   Dg Abd Acute W/chest 01/04/2016  Negative abdominal radiographs.  No acute cardiopulmonary disease. Electronically Signed   By: Signa Kell M.D.   On: 01/04/2016 23:11   Medical Consultants:  None   Other Consultants:  None   IAnti-Infectives:   None    Manson Passey, MD  [facility] Pager [phone]  Time spent in minutes: 25 minutes  If 7PM-7AM, please contact night-coverage [email or web address] Password [email or web address] 01/06/2016, 10:49 AM   LOS: 1 day    HPI/Subjective: No acute overnight events. Patient reports abdominal pain is 7 out of 10.  Objective: Filed Vitals:   01/05/16 2042 01/06/16 0508 01/06/16 0818 01/06/16 0851  BP: 133/94 111/65 105/70   Pulse: 98 97 92   Temp: 98.6 F (37 C) 98.8 F (37.1 C) 98.2 F (36.8 C)   TempSrc: Oral Oral Oral   Resp: 18 18    Height:      Weight:      SpO2: 98% 96% 98% 98%    Intake/Output Summary (Last 24 hours) at 01/06/16 1049 Last data filed at 01/06/16 1001  Gross per 24 hour  Intake      0 ml  Output    900 ml  Net   -900 ml    Exam:   General:  Pt is alert, follows commands appropriately, not in acute distress  Cardiovascular: Regular rate and rhythm, S1/S2 (+)  Respiratory: Clear to auscultation bilaterally, no wheezing, no crackles, no rhonchi  Abdomen: Soft, tender in  mid abdomen, (+) BS  Extremities: No edema, pulses DP and PT palpable bilaterally  Neuro: Grossly nonfocal  Data Reviewed: Basic Metabolic Panel:  Recent Labs Lab 01/04/16 2119 01/05/16 0635 01/06/16 0609  NA 137  --  136  K 4.2  --  3.9  CL 102  --  106  CO2 25  --  24  GLUCOSE 249*  --  169*  BUN 14  --  7  CREATININE 0.70 0.59 0.48  CALCIUM 10.4*  --  8.0*   Liver Function Tests:  Recent Labs Lab 01/04/16 2119 01/06/16 0609  AST 18 16  ALT 21 17  ALKPHOS 95  74  BILITOT 0.7 0.9  PROT 8.2* 6.3*  ALBUMIN 4.2 3.0*    Recent Labs Lab 01/04/16 2119  LIPASE 32   No results for input(s): AMMONIA in the last 168 hours. CBC:  Recent Labs Lab 01/04/16 2119 01/05/16 0635 01/06/16 0609  WBC 18.4* 14.2* 12.1*  HGB 10.9* 9.5* 8.9*  HCT 35.6* 30.8* 28.7*  MCV 74.5* 72.6* 74.9*  PLT 481* 379 351   Cardiac Enzymes: No results for input(s): CKTOTAL, CKMB, CKMBINDEX, TROPONINI in the last 168 hours. BNP: Invalid input(s): POCBNP CBG:  Recent Labs Lab 01/05/16 2010 01/05/16 2204 01/06/16 0008 01/06/16 0358 01/06/16 0744  GLUCAP 82 75 166* 168* 128*    No results found for this or any previous visit (from the past 240 hour(s)).   Scheduled Meds: . albuterol  2.5 mg Nebulization BID  . budesonide  0.25 mg Inhalation BID  . enoxaparin (LOVENOX) injection  40 mg Subcutaneous Q24H  . insulin aspart  0-15 Units Subcutaneous 6 times per day  . insulin glargine  8 Units Subcutaneous BID  . pantoprazole (PROTONIX) IV  40 mg Intravenous BID   Continuous Infusions: . dextrose 5 % and 0.9% NaCl 50 mL/hr (01/06/16 0433)

## 2016-01-07 DIAGNOSIS — R109 Unspecified abdominal pain: Secondary | ICD-10-CM

## 2016-01-07 LAB — GLUCOSE, CAPILLARY
GLUCOSE-CAPILLARY: 136 mg/dL — AB (ref 65–99)
GLUCOSE-CAPILLARY: 181 mg/dL — AB (ref 65–99)
Glucose-Capillary: 145 mg/dL — ABNORMAL HIGH (ref 65–99)

## 2016-01-07 LAB — HEMOGLOBIN A1C
HEMOGLOBIN A1C: 8.7 % — AB (ref 4.8–5.6)
MEAN PLASMA GLUCOSE: 203 mg/dL

## 2016-01-07 MED ORDER — ONDANSETRON HCL 4 MG PO TABS: 4.0000 mg | ORAL_TABLET | Freq: Three times a day (TID) | ORAL | Status: DC | PRN

## 2016-01-07 MED ORDER — SODIUM CHLORIDE 0.9 % IV BOLUS (SEPSIS)
250.0000 mL | Freq: Once | INTRAVENOUS | Status: AC
  Administered 2016-01-07: 250 mL via INTRAVENOUS

## 2016-01-07 NOTE — Discharge Summary (Signed)
Physician Discharge Summary  Heather Wagner ZOX:[id number] DOB: [birth year] DOA: 01/04/2016  PCP: Tommy Rainwater, MD  Admit date: 01/04/2016 Discharge date: 01/07/2016  Recommendations for Outpatient Follow-up:  1. Check CBC and BMP in PCP office during next visit 2. Clear liquids for 1-2 days, advance slowly  Discharge Diagnoses:  Active Problems:   Leucocytosis   Microcytic anemia   Asthma   Type 2 diabetes mellitus without complication, with long-term current use of insulin (HCC)   Intractable migraine without aura   Hereditary pancreatitis s/p Whipple   Abdominal pain   Pancreatitis, acute    Discharge Condition: stable; discussed with the pt at the bedside with RN present. Pt insists on going home today and understand to advance diet slowly   Diet recommendation: as tolerated   History of present illness:  40 year old female with past medical history of diabetes mellitus on insulin, chronic pancreatitis status post partial pancreatectomy who presented with worsening epigastric pain for past 24 hours prior to this admission associated with ongoing nausea and vomiting and inability to tolerate any by mouth intake. She was admitted for management of pancreatitis.  Hospital Course:    Assessment/Plan:    Principal problem: Epigastric pain / acute on chronic pancreatitis / leukocytosis - In patient with past medical history of partial pancreatectomy. CT scan confirmed acute pancreatitis - Lipase was 32 on the admission likely artificially low because of history of pancreatectomy - Tolerates clears today - Wants to go home - Continue Zofran as needed at home, prescription provided - Continue pain management efforts   Active Problems:  Uncontrolled, type 2 diabetes mellitus without complication, with long-term current use of insulin (HCC) - A1c on this admission is 8.9, slightly above the goal range for diabetic patient - Continue insulin regimen as per  prior to this admission    Hypercalcemia - Likely from dehydration - Has improved with IV fluids   Microcytic anemia - Hemoglobin stable at 10.9  DVT Prophylaxis  - Lovenox subcutaneous in hospital    Code Status: Full.  Family Communication: plan of care discussed with the patient   IV access:  Peripheral IV  Procedures and diagnostic studies:   Ct Abdomen Pelvis W Contrast 01/05/2016 1. Partial pancreatectomy with acute pancreatitis. No necrosis or collection. 2. Hepatic steatosis. Electronically Signed By: Marnee Spring M.D. On: 01/05/2016 04:49   Dg Abd Acute W/chest 01/04/2016 Negative abdominal radiographs. No acute cardiopulmonary disease. Electronically Signed By: Signa Kell M.D. On: 01/04/2016 23:11   Medical Consultants:  None   Other Consultants:  None   IAnti-Infectives:   None    Signed:  Manson Passey, MD  [facility] 01/07/2016, 9:31 AM  Pager #: [phone]  Time spent in minutes: more than 30 minutes   Discharge Exam: Filed Vitals:   01/06/16 1319 01/06/16 2020  BP: 120/59 143/76  Pulse: 88 115  Temp: 98.1 F (36.7 C) 98.3 F (36.8 C)  Resp:  17   Filed Vitals:   01/06/16 1319 01/06/16 1925 01/06/16 2020 01/06/16 2104  BP: 120/59  143/76   Pulse: 88  115   Temp: 98.1 F (36.7 C)  98.3 F (36.8 C)   TempSrc: Oral  Oral   Resp:   17   Height:      Weight:      SpO2: 99% 99% 100% 98%    General: Pt is alert, follows commands appropriately, not in acute distress Cardiovascular: Regular rate and rhythm, S1/S2 +, no murmurs Respiratory: Clear to auscultation bilaterally,  no wheezing, no crackles, no rhonchi Abdominal: Soft, non tender, non distended, bowel sounds +, no guarding Extremities: no edema, no cyanosis, pulses palpable bilaterally DP and PT Neuro: Grossly nonfocal  Discharge Instructions  Discharge Instructions    Call MD for:  difficulty breathing, headache or visual  disturbances    Complete by:  As directed      Call MD for:  persistant dizziness or light-headedness    Complete by:  As directed      Call MD for:  persistant nausea and vomiting    Complete by:  As directed      Call MD for:  severe uncontrolled pain    Complete by:  As directed      Diet - low sodium heart healthy    Complete by:  As directed      Increase activity slowly    Complete by:  As directed             Medication List    TAKE these medications        albuterol 108 (90 Base) MCG/ACT inhaler  Commonly known as:  PROVENTIL HFA;VENTOLIN HFA  Inhale 1-2 puffs into the lungs every 6 (six) hours as needed for wheezing. Asthma     ALIVE WOMENS GUMMY PO  Take 2 tablets by mouth daily.     beclomethasone 80 MCG/ACT inhaler  Commonly known as:  QVAR  Inhale 1 puff into the lungs 2 (two) times daily.     calcium carbonate 750 MG chewable tablet  Commonly known as:  TUMS EX  Chew 2-4 tablets by mouth 2 (two) times daily as needed for heartburn. No more than 10 a day     HYDROcodone-acetaminophen 5-325 MG tablet  Commonly known as:  NORCO/VICODIN  Take 2 tablets by mouth every 6 (six) hours as needed for moderate pain.     ibuprofen 200 MG tablet  Commonly known as:  ADVIL,MOTRIN  Take 400-800 mg by mouth every 8 (eight) hours as needed for moderate pain.     insulin aspart 100 UNIT/ML injection  Commonly known as:  novoLOG  Inject 1-5 Units into the skin. 5-6 times daily with meals and extra if needed, injecting 1 unit for every 15 carbs over 150.     LANTUS SOLOSTAR 100 UNIT/ML Solostar Pen  Generic drug:  Insulin Glargine  Inject 19 Units into the skin 2 (two) times daily.     LORazepam 1 MG tablet  Commonly known as:  ATIVAN  ONE HALF to 1 tab PO BID prn anxiety     Melatonin 5 MG Tabs  Take 1 tablet by mouth at bedtime.     omeprazole 20 MG capsule  Commonly known as:  PRILOSEC  Take 20 mg by mouth every morning.     ondansetron 4 MG tablet   Commonly known as:  ZOFRAN  Take 1 tablet (4 mg total) by mouth every 8 (eight) hours as needed for nausea. As needed     OVER THE COUNTER MEDICATION  Take 1 Dose by mouth at bedtime as needed (insomnia). Valarian/liqourice root tea     polyethylene glycol packet  Commonly known as:  MIRALAX / GLYCOLAX  Take 17 g by mouth daily as needed for mild constipation.     PROBIOTIC DAILY PO  Take 1 capsule by mouth daily. ulta flora adult probiotic 1 daily     rizatriptan 10 MG tablet  Commonly known as:  MAXALT  Take 10 mg by mouth  as needed for migraine. May repeat in 2 hours if needed for headaches     VIACTIV MULTI-VITAMIN Chew  Chew 1 each by mouth daily.     ZENPEP 25000 units Cpep  Generic drug:  Pancrelipase (Lip-Prot-Amyl)  Take 1-3 capsules by mouth 3 (three) times daily with meals as needed. 3 capsules per meal and 1 capsules per snack           Follow-up Information    Follow up with Shamleffer, Landry Mellow, MD. Schedule an appointment as soon as possible for a visit in 3 days.   Specialty:  Internal Medicine   Why:  Follow up appt after recent hospitalization   Contact information:   [address]  [address] [place] [address] [phone]        The results of significant diagnostics from this hospitalization (including imaging, microbiology, ancillary and laboratory) are listed below for reference.    Significant Diagnostic Studies: Ct Abdomen Pelvis W Contrast  01/05/2016  CLINICAL DATA:  Abdominal pain. History of pancreatitis and Whipple procedure. EXAM: CT ABDOMEN AND PELVIS WITH CONTRAST TECHNIQUE: Multidetector CT imaging of the abdomen and pelvis was performed using the standard protocol following bolus administration of intravenous contrast. CONTRAST:  OMNIPAQUE IOHEXOL 300 MG/ML  SOLN COMPARISON:  06/18/2014 FINDINGS: Lower chest and abdominal wall: Mild atelectasis or scar at the right base. Hepatobiliary: Hepatic steatosis. Stable  low-density areas in the subcapsular segment 6 which could be focal fat infiltration or scarring from open surgery. Sparing or perfusion anomaly in the subcapsular central liver. Cholecystectomy. Pneumobilia from partial pancreatectomy Pancreas: Partial pancreatectomy with the chronically dilated main duct visible to the posterior margin of the stomach. There is peripancreatic edema, non organized, around the remaining pancreas. No necrosis or vascular compromise. Spleen: Unremarkable. Adrenals/Urinary Tract: Negative adrenals. No hydronephrosis or stone. Unremarkable bladder. Reproductive:Fibroid uterus including an exophytic 4 cm right body fibroid. Right corpus luteum. Stomach/Bowel:  No obstruction. No appendicitis. Vascular/Lymphatic: No acute vascular abnormality. No mass or adenopathy. Peritoneal: No pneumoperitoneum Musculoskeletal: No acute abnormalities. IMPRESSION: 1. Partial pancreatectomy with acute pancreatitis. No necrosis or collection. 2. Hepatic steatosis. Electronically Signed   By: Marnee Spring M.D.   On: 01/05/2016 04:49   Dg Abd Acute W/chest  01/04/2016  CLINICAL DATA:  Mid abdominal pain for 24 hours EXAM: DG ABDOMEN ACUTE W/ 1V CHEST COMPARISON:  09/20/2015 FINDINGS: There is no evidence of dilated bowel loops or free intraperitoneal air. No radiopaque calculi or other significant radiographic abnormality is seen. Heart size and mediastinal contours are within normal limits. Both lungs are clear. IMPRESSION: Negative abdominal radiographs.  No acute cardiopulmonary disease. Electronically Signed   By: Signa Kell M.D.   On: 01/04/2016 23:11    Microbiology: No results found for this or any previous visit (from the past 240 hour(s)).   Labs: Basic Metabolic Panel:  Recent Labs Lab 01/04/16 2119 01/05/16 0635 01/06/16 0609  NA 137  --  136  K 4.2  --  3.9  CL 102  --  106  CO2 25  --  24  GLUCOSE 249*  --  169*  BUN 14  --  7  CREATININE 0.70 0.59 0.48  CALCIUM  10.4*  --  8.0*   Liver Function Tests:  Recent Labs Lab 01/04/16 2119 01/06/16 0609  AST 18 16  ALT 21 17  ALKPHOS 95 74  BILITOT 0.7 0.9  PROT 8.2* 6.3*  ALBUMIN 4.2 3.0*    Recent Labs Lab  01/04/16 2119  LIPASE 32   No results for input(s): AMMONIA in the last 168 hours. CBC:  Recent Labs Lab 01/04/16 2119 01/05/16 0635 01/06/16 0609  WBC 18.4* 14.2* 12.1*  HGB 10.9* 9.5* 8.9*  HCT 35.6* 30.8* 28.7*  MCV 74.5* 72.6* 74.9*  PLT 481* 379 351   Cardiac Enzymes: No results for input(s): CKTOTAL, CKMB, CKMBINDEX, TROPONINI in the last 168 hours. BNP: BNP (last 3 results) No results for input(s): BNP in the last 8760 hours.  ProBNP (last 3 results) No results for input(s): PROBNP in the last 8760 hours.  CBG:  Recent Labs Lab 01/06/16 1651 01/06/16 2018 01/06/16 2357 01/07/16 0427 01/07/16 0742  GLUCAP 141* 166* 181* 136* 145*

## 2016-01-07 NOTE — Progress Notes (Signed)
Patient discharged.  Leaving with personal belongings and one prescription.  Reports pain is tolerable at a 5/10 both abdomen and head.  Accompanied by husband.  Room air.  No s/s of distress.  A&O x4.  No complaints.

## 2016-01-07 NOTE — Discharge Instructions (Signed)

## 2016-01-09 LAB — FOLATE RBC
FOLATE, RBC: 1453 ng/mL (ref >498)
Folate, Hemolysate: 414.2 ng/mL
Hematocrit: 28.5 % — ABNORMAL LOW (ref 34.0–46.6)

## 2016-01-16 ENCOUNTER — Encounter: Payer: Self-pay | Admitting: Pulmonary Disease

## 2016-01-16 ENCOUNTER — Ambulatory Visit (INDEPENDENT_AMBULATORY_CARE_PROVIDER_SITE_OTHER)
Admission: RE | Admit: 2016-01-16 | Discharge: 2016-01-16 | Disposition: A | Discharge status: Home / Self Care | Payer: BLUE CROSS/BLUE SHIELD | Source: Ambulatory Visit | Attending: Pulmonary Disease | Admitting: Pulmonary Disease

## 2016-01-16 ENCOUNTER — Ambulatory Visit (INDEPENDENT_AMBULATORY_CARE_PROVIDER_SITE_OTHER): Payer: BLUE CROSS/BLUE SHIELD | Admitting: Pulmonary Disease

## 2016-01-16 ENCOUNTER — Other Ambulatory Visit (INDEPENDENT_AMBULATORY_CARE_PROVIDER_SITE_OTHER): Payer: BLUE CROSS/BLUE SHIELD

## 2016-01-16 VITALS — BP 128/80 | HR 84 | Ht 64.0 in | Wt 161.0 lb

## 2016-01-16 DIAGNOSIS — J4530 Mild persistent asthma, uncomplicated: Secondary | ICD-10-CM | POA: Diagnosis not present

## 2016-01-16 LAB — CBC WITH DIFFERENTIAL/PLATELET
BASOS ABS: 0 10*3/uL (ref 0.0–0.1)
Basophils Relative: 0.3 % (ref 0.0–3.0)
EOS ABS: 0.2 10*3/uL (ref 0.0–0.7)
Eosinophils Relative: 1.8 % (ref 0.0–5.0)
HCT: 34.7 % — ABNORMAL LOW (ref 36.0–46.0)
Hemoglobin: 10.9 g/dL — ABNORMAL LOW (ref 12.0–15.0)
LYMPHS ABS: 2.8 10*3/uL (ref 0.7–4.0)
Lymphocytes Relative: 27.8 % (ref 12.0–46.0)
MCHC: 31.5 g/dL (ref 30.0–36.0)
MCV: 72.6 fl — ABNORMAL LOW (ref 78.0–100.0)
MONO ABS: 0.9 10*3/uL (ref 0.1–1.0)
Monocytes Relative: 8.9 % (ref 3.0–12.0)
NEUTROS ABS: 6.1 10*3/uL (ref 1.4–7.7)
NEUTROS PCT: 61.2 % (ref 43.0–77.0)
PLATELETS: 499 10*3/uL — AB (ref 150.0–400.0)
RBC: 4.78 Mil/uL (ref 3.87–5.11)
RDW: 16.7 % — ABNORMAL HIGH (ref 11.5–15.5)
WBC: 10 10*3/uL (ref 4.0–10.5)

## 2016-01-16 LAB — NITRIC OXIDE: NITRIC OXIDE: 20

## 2016-01-16 NOTE — Patient Instructions (Signed)
We will arrange a lung function tests Keep taking your medications as you're doing We will call you with the results of the bloodwork and the chest x-ray We will see you back in about 4 weeks to go over these results, you will likely need to see our nurse practitioner for that visit.

## 2016-01-16 NOTE — Progress Notes (Signed)
Subjective:    Patient ID: Heather Wagner, female    DOB: 1976/09/16, 40 y.o.   MRN: [id number]  Synopsis: Former patient of Dr. Shelle Iron with asthma.  She also has chronic pancreatitis. Diagnosed with asthma as a child.  Also has allergic rhinitis, Sjogren's diease.   HPI Chief Complaint  Patient presents with  . Follow-up    former [facility] pt being treated for asthma.  c/o chest tightness, difficulty getting a complete inhale.     [name] says that she has been having more trouble with her breathing lately.  She says that she has been having chest tightness and dyspnea.  She says that she feels a little short of breath as well.  She denies wheezing.  She feels that humidity in the summer will make it worse.  She notes that dust makes it worse.  She feels weather changes are the worse.  Over exertion ("any exercise") without her inhaler will make it worse.  She generally takes albuteorl prior ot exercise.  She was diagnosed with asthma as a child (age 53).  Last hospitalization was in 1996.  Never on mechanical ventilation.   She has allergic rhinitis.  She can't take nasal steroids or antihistamine due to headache.   She says that when she took an increased dose of the QVar (2 puffs AM, 1 puff PM) it made her more anxious. She has needed albuterol daily (at least once) for a year. She says that she didn't grow up here and she thinks that the weather hear makes her more dyspneic.     Past Medical History  Diagnosis Date  . Pancreatitis chronic   . Asthma   . Diabetes mellitus   . Fibromyalgia   . Migraines   . Hyperlipidemia   . IBS (irritable bowel syndrome)   . Chronic sinusitis   . Migraine without aura, with intractable migraine, so stated, without mention of status migrainosus 01/25/2014  . Vulvar vestibulitis   . Sjogren's syndrome (HCC)        Review of Systems  Constitutional: Positive for fatigue. Negative for fever and chills.  HENT: Positive for postnasal drip,  rhinorrhea and sinus pressure.   Respiratory: Positive for chest tightness and shortness of breath. Negative for cough and wheezing.   Cardiovascular: Negative for chest pain, palpitations and leg swelling.       Objective:   Physical Exam  Filed Vitals:   01/16/16 1026  BP: 128/80  Pulse: 84  Height:  (1.626 m)  Weight: 161 lb (73.029 kg)  SpO2: 98%   RA  Gen: well appearing HENT: OP clear, TM's clear, neck supple PULM: CTA B, normal percussion CV: RRR, no mgr, trace edema GI: BS+, soft, nontender Derm: no cyanosis or rash Psyche: normal mood and affect  Notes from my partner reviewed where she was treated for asthma  CBC    Component Value Date/Time   WBC 12.1* 01/06/2016 0609   RBC 3.83* 01/06/2016 0609   HGB 8.9* 01/06/2016 0609   HCT 28.5* 01/06/2016 0610   HCT 28.7* 01/06/2016 0609   PLT 351 01/06/2016 0609   MCV 74.9* 01/06/2016 0609   MCH 23.2* 01/06/2016 0609   MCHC 31.0 01/06/2016 0609   RDW 16.3* 01/06/2016 0609   LYMPHSABS 3.1 06/18/2014 0049   MONOABS 1.3* 06/18/2014 0049   EOSABS 0.3 06/18/2014 0049   BASOSABS 0.0 06/18/2014 0049         Assessment & Plan:  Asthma She notes asthma symptoms for  her entire life (since age 52. She says that she has been intolerant to increasing doses of her Qvar but she describes chest tightness, shortness of breath which is worse for the past year requiring daily use of albuterol. This would qualify as poorly controlled asthma. However, I explained to her at length today that the differential diagnosis of her symptoms are broad and includes other conditions such as vocal cord dysfunction. In her case, her lungs are perfectly clear and she has had normal spirometry here in the past. So it's not clear to me if her symptoms are due to asthma or not.  Initially, we will assume that this is an poorly controlled asthma syndrome therefore we will workup for allergies. I recommended treatment for allergic rhinitis but  she says that these medications have exacerbated her headaches and she cannot take them.  Plan: At this point we are going to look for objective evidence of asthma and airflow obstruction Full pulmonary function test Exhaled NO testing now Chest x-ray Serum IgE Serum CBC with differential Serum RAST profile Follow-up 4 weeks Continue current medication regimen for now, will make adjustments based on the findings from this testing Consider methacholine challenge testing in the future     Current outpatient prescriptions:  .  albuterol (PROVENTIL HFA;VENTOLIN HFA) 108 (90 BASE) MCG/ACT inhaler, Inhale 1-2 puffs into the lungs every 6 (six) hours as needed for wheezing. Asthma, Disp: , Rfl:  .  beclomethasone (QVAR) 80 MCG/ACT inhaler, Inhale 1 puff into the lungs 2 (two) times daily. , Disp: , Rfl:  .  calcium carbonate (TUMS EX) 750 MG chewable tablet, Chew 2-4 tablets by mouth 2 (two) times daily as needed for heartburn. No more than 10 a day, Disp: , Rfl:  .  HYDROcodone-acetaminophen (NORCO/VICODIN) 5-325 MG per tablet, Take 2 tablets by mouth every 6 (six) hours as needed for moderate pain. , Disp: , Rfl:  .  ibuprofen (ADVIL,MOTRIN) 200 MG tablet, Take 400-800 mg by mouth every 8 (eight) hours as needed for moderate pain. , Disp: , Rfl:  .  insulin aspart (NOVOLOG) 100 UNIT/ML injection, Inject 1-5 Units into the skin. 5-6 times daily with meals and extra if needed, injecting 1 unit for every 15 carbs, 1 unit every 50 over 150., Disp: , Rfl:  .  LANTUS SOLOSTAR 100 UNIT/ML Solostar Pen, Inject 19 Units into the skin 2 (two) times daily., Disp: , Rfl: 5 .  LORazepam (ATIVAN) 1 MG tablet, ONE HALF to 1 tab PO BID prn anxiety (Patient taking differently: Take 0.5-1 mg by mouth 2 (two) times daily as needed for anxiety. ), Disp: 4 tablet, Rfl: 0 .  Melatonin 5 MG TABS, Take 1 tablet by mouth at bedtime., Disp: , Rfl:  .  Multiple Vitamins-Calcium (VIACTIV MULTI-VITAMIN) CHEW, Chew 1 each  by mouth daily., Disp: , Rfl:  .  Multiple Vitamins-Minerals (ALIVE WOMENS GUMMY PO), Take 2 tablets by mouth daily., Disp: , Rfl:  .  omeprazole (PRILOSEC) 20 MG capsule, Take 20 mg by mouth every morning. , Disp: , Rfl:  .  ondansetron (ZOFRAN) 4 MG tablet, Take 1 tablet (4 mg total) by mouth every 8 (eight) hours as needed for nausea. As needed, Disp: 20 tablet, Rfl: 0 .  OVER THE COUNTER MEDICATION, Take 1 Dose by mouth at bedtime as needed (insomnia). Valarian/liqourice root tea, Disp: , Rfl:  .  polyethylene glycol (MIRALAX / GLYCOLAX) packet, Take 17 g by mouth daily as needed for mild constipation., Disp: ,  Rfl:  .  rizatriptan (MAXALT) 10 MG tablet, Take 10 mg by mouth as needed for migraine. May repeat in 2 hours if needed for headaches, Disp: , Rfl:  .  ZENPEP 25000 units CPEP, Take 1-3 capsules by mouth 3 (three) times daily with meals as needed. 3-4 caps per meal and 1-2 caps per snack, Disp: , Rfl: 11 .  Probiotic Product (PROBIOTIC DAILY PO), Take 1 capsule by mouth daily. Reported on 01/16/2016, Disp: , Rfl:

## 2016-01-16 NOTE — Assessment & Plan Note (Signed)
She notes asthma symptoms for her entire life (since age 40. She says that she has been intolerant to increasing doses of her Qvar but she describes chest tightness, shortness of breath which is worse for the past year requiring daily use of albuterol. This would qualify as poorly controlled asthma. However, I explained to her at length today that the differential diagnosis of her symptoms are broad and includes other conditions such as vocal cord dysfunction. In her case, her lungs are perfectly clear and she has had normal spirometry here in the past. So it's not clear to me if her symptoms are due to asthma or not.  Initially, we will assume that this is an poorly controlled asthma syndrome therefore we will workup for allergies. I recommended treatment for allergic rhinitis but she says that these medications have exacerbated her headaches and she cannot take them.  Plan: At this point we are going to look for objective evidence of asthma and airflow obstruction Full pulmonary function test Exhaled NO testing now Chest x-ray Serum IgE Serum CBC with differential Serum RAST profile Follow-up 4 weeks Continue current medication regimen for now, will make adjustments based on the findings from this testing Consider methacholine challenge testing in the future

## 2016-02-13 ENCOUNTER — Ambulatory Visit: Payer: Medicare Other | Admitting: Adult Health

## 2016-02-13 ENCOUNTER — Ambulatory Visit (HOSPITAL_COMMUNITY): Payer: BLUE CROSS/BLUE SHIELD

## 2016-04-09 LAB — CBC AND DIFFERENTIAL
HCT: 32 % — AB (ref 36–46)
Hemoglobin: 9.9 g/dL — AB (ref 12.0–16.0)
Platelets: 427 10*3/uL — AB (ref 150–399)
WBC: 9.1 10^3/mL

## 2016-04-09 LAB — BASIC METABOLIC PANEL
BUN: 11 mg/dL (ref 4–21)
Creatinine: 0.6 mg/dL (ref 0.5–1.1)
GLUCOSE: 210 mg/dL
POTASSIUM: 4.4 mmol/L (ref 3.4–5.3)
SODIUM: 133 mmol/L — AB (ref 137–147)

## 2016-04-09 LAB — HEPATIC FUNCTION PANEL
ALT: 27 U/L (ref 7–35)
AST: 24 U/L (ref 13–35)
Alkaline Phosphatase: 98 U/L (ref 25–125)
Bilirubin, Total: 0.5 mg/dL

## 2016-07-10 ENCOUNTER — Observation Stay (HOSPITAL_COMMUNITY)
Admission: EM | Admit: 2016-07-10 | Discharge: 2016-07-11 | Disposition: A | Discharge status: Home / Self Care | Payer: BLUE CROSS/BLUE SHIELD | Attending: Internal Medicine | Admitting: Internal Medicine

## 2016-07-10 ENCOUNTER — Emergency Department (HOSPITAL_COMMUNITY): Payer: BLUE CROSS/BLUE SHIELD

## 2016-07-10 DIAGNOSIS — M797 Fibromyalgia: Secondary | ICD-10-CM | POA: Diagnosis not present

## 2016-07-10 DIAGNOSIS — M3500 Sicca syndrome, unspecified: Secondary | ICD-10-CM | POA: Diagnosis not present

## 2016-07-10 DIAGNOSIS — E860 Dehydration: Secondary | ICD-10-CM | POA: Insufficient documentation

## 2016-07-10 DIAGNOSIS — E785 Hyperlipidemia, unspecified: Secondary | ICD-10-CM | POA: Insufficient documentation

## 2016-07-10 DIAGNOSIS — K581 Irritable bowel syndrome with constipation: Secondary | ICD-10-CM | POA: Insufficient documentation

## 2016-07-10 DIAGNOSIS — J45909 Unspecified asthma, uncomplicated: Secondary | ICD-10-CM | POA: Diagnosis not present

## 2016-07-10 DIAGNOSIS — Z90411 Acquired partial absence of pancreas: Secondary | ICD-10-CM | POA: Diagnosis not present

## 2016-07-10 DIAGNOSIS — Z79899 Other long term (current) drug therapy: Secondary | ICD-10-CM | POA: Diagnosis not present

## 2016-07-10 DIAGNOSIS — K760 Fatty (change of) liver, not elsewhere classified: Secondary | ICD-10-CM | POA: Diagnosis not present

## 2016-07-10 DIAGNOSIS — K859 Acute pancreatitis without necrosis or infection, unspecified: Principal | ICD-10-CM | POA: Insufficient documentation

## 2016-07-10 DIAGNOSIS — Z888 Allergy status to other drugs, medicaments and biological substances status: Secondary | ICD-10-CM | POA: Diagnosis not present

## 2016-07-10 DIAGNOSIS — R7989 Other specified abnormal findings of blood chemistry: Secondary | ICD-10-CM | POA: Diagnosis not present

## 2016-07-10 DIAGNOSIS — Z794 Long term (current) use of insulin: Secondary | ICD-10-CM | POA: Diagnosis not present

## 2016-07-10 DIAGNOSIS — G43019 Migraine without aura, intractable, without status migrainosus: Secondary | ICD-10-CM | POA: Insufficient documentation

## 2016-07-10 DIAGNOSIS — E119 Type 2 diabetes mellitus without complications: Secondary | ICD-10-CM | POA: Diagnosis not present

## 2016-07-10 DIAGNOSIS — F419 Anxiety disorder, unspecified: Secondary | ICD-10-CM | POA: Insufficient documentation

## 2016-07-10 DIAGNOSIS — K861 Other chronic pancreatitis: Secondary | ICD-10-CM | POA: Diagnosis not present

## 2016-07-10 DIAGNOSIS — R945 Abnormal results of liver function studies: Secondary | ICD-10-CM

## 2016-07-10 LAB — URINALYSIS, ROUTINE W REFLEX MICROSCOPIC
Bilirubin Urine: NEGATIVE
GLUCOSE, UA: NEGATIVE mg/dL
Hgb urine dipstick: NEGATIVE
Ketones, ur: NEGATIVE mg/dL
LEUKOCYTES UA: NEGATIVE
NITRITE: NEGATIVE
PH: 8 (ref 5.0–8.0)
Protein, ur: NEGATIVE mg/dL
SPECIFIC GRAVITY, URINE: 1.022 (ref 1.005–1.030)

## 2016-07-10 LAB — COMPREHENSIVE METABOLIC PANEL
ALT: 520 U/L — ABNORMAL HIGH (ref 14–54)
ANION GAP: 11 (ref 5–15)
AST: 847 U/L — ABNORMAL HIGH (ref 15–41)
Albumin: 4.4 g/dL (ref 3.5–5.0)
Alkaline Phosphatase: 241 U/L — ABNORMAL HIGH (ref 38–126)
BUN: 16 mg/dL (ref 6–20)
CHLORIDE: 97 mmol/L — AB (ref 101–111)
CO2: 27 mmol/L (ref 22–32)
Calcium: 10.5 mg/dL — ABNORMAL HIGH (ref 8.9–10.3)
Creatinine, Ser: 0.64 mg/dL (ref 0.44–1.00)
GFR calc non Af Amer: >60 mL/min (ref >60)
Glucose, Bld: 129 mg/dL — ABNORMAL HIGH (ref 65–99)
Potassium: 3.8 mmol/L (ref 3.5–5.1)
SODIUM: 135 mmol/L (ref 135–145)
Total Bilirubin: 1.4 mg/dL — ABNORMAL HIGH (ref 0.3–1.2)
Total Protein: 8.2 g/dL — ABNORMAL HIGH (ref 6.5–8.1)

## 2016-07-10 LAB — CBC
HEMATOCRIT: 36.5 % (ref 36.0–46.0)
HEMOGLOBIN: 11.5 g/dL — AB (ref 12.0–15.0)
MCH: 22.7 pg — ABNORMAL LOW (ref 26.0–34.0)
MCHC: 31.5 g/dL (ref 30.0–36.0)
MCV: 72.1 fL — AB (ref 78.0–100.0)
Platelets: 488 10*3/uL — ABNORMAL HIGH (ref 150–400)
RBC: 5.06 MIL/uL (ref 3.87–5.11)
RDW: 15.8 % — AB (ref 11.5–15.5)
WBC: 18.3 10*3/uL — AB (ref 4.0–10.5)

## 2016-07-10 LAB — LIPASE, BLOOD: LIPASE: 14 U/L (ref 11–51)

## 2016-07-10 LAB — CBG MONITORING, ED: GLUCOSE-CAPILLARY: 156 mg/dL — AB (ref 65–99)

## 2016-07-10 LAB — I-STAT BETA HCG BLOOD, ED (MC, WL, AP ONLY): I-stat hCG, quantitative: <5 m[IU]/mL (ref <5)

## 2016-07-10 MED ORDER — HYDROMORPHONE HCL 1 MG/ML IJ SOLN
1.0000 mg | Freq: Once | INTRAMUSCULAR | Status: AC
  Administered 2016-07-10: 1 mg via INTRAVENOUS
  Filled 2016-07-10: qty 1

## 2016-07-10 MED ORDER — ONDANSETRON HCL 4 MG/2ML IJ SOLN
4.0000 mg | Freq: Once | INTRAMUSCULAR | Status: AC
  Administered 2016-07-10: 4 mg via INTRAVENOUS
  Filled 2016-07-10: qty 2

## 2016-07-10 MED ORDER — SODIUM CHLORIDE 0.9 % IV BOLUS (SEPSIS)
1000.0000 mL | Freq: Once | INTRAVENOUS | Status: AC
  Administered 2016-07-10: 1000 mL via INTRAVENOUS

## 2016-07-10 MED ORDER — IOPAMIDOL (ISOVUE-300) INJECTION 61%
100.0000 mL | Freq: Once | INTRAVENOUS | Status: AC | PRN
  Administered 2016-07-10: 100 mL via INTRAVENOUS

## 2016-07-10 MED ORDER — DIATRIZOATE MEGLUMINE & SODIUM 66-10 % PO SOLN: 15.0000 mL | Freq: Once | ORAL | Status: DC

## 2016-07-10 MED ORDER — DIATRIZOATE MEGLUMINE & SODIUM 66-10 % PO SOLN
15.0000 mL | Freq: Once | ORAL | Status: AC
  Administered 2016-07-10: 15 mL via ORAL

## 2016-07-10 NOTE — ED Notes (Signed)
Pt in ct 

## 2016-07-10 NOTE — ED Triage Notes (Signed)
Pt c/o abdominal pain, nausea, emesis, migraine headache that patient attributes to pancreatitis exacerbation. Hx of DM and pancreatitis.

## 2016-07-10 NOTE — ED Provider Notes (Signed)
[facility]-EMERGENCY DEPT Provider Note   CSN: [id number] Arrival date & time: 07/10/16  1754  First Provider Contact:  First MD Initiated Contact with Patient 07/10/16 1845        History   Chief Complaint Chief Complaint  Patient presents with  . Abdominal Pain    HPI Heather Wagner is a 40 y.o. female.  Patient presents to the ED with a chief complaint of abdominal pain.  She states that she has a long history of recurrent pancreatitis.  She is followed by a specialist at [facility].  She states that she only has about 20% of her pancreas remaining from prior surgeries.  She states that she has had increasingly more frequent pancreatitis flares in the past year and is scheduled to see her specialist at [facility] next week.  She states that her current symptoms started this morning.  She reports associated nausea and vomiting.  She also states that she has not been able to eat or drink anything in the past day and this is causing her to have a headache.   The history is provided by the patient. No language interpreter was used.    Past Medical History:  Diagnosis Date  . Asthma   . Chronic sinusitis   . Diabetes mellitus   . Fibromyalgia   . Hyperlipidemia   . IBS (irritable bowel syndrome)   . Migraine without aura, with intractable migraine, so stated, without mention of status migrainosus 01/25/2014  . Migraines   . Pancreatitis chronic   . Sjogren's syndrome (HCC)   . Vulvar vestibulitis     Patient Active Problem List   Diagnosis Date Noted  . 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 Abdominal pain 01/05/2016  . Pancreatitis, acute   . CAP (community acquired pneumonia) 06/19/2014  . Pain management 06/18/2014  . Chronic pancreatitis (HCC) 06/18/2014  . Intractable migraine without aura 01/25/2014  . Acute pancreatitis 09/25/2013  . Leucocytosis 09/25/2013  . Microcytic anemia 09/25/2013  . Asthma 09/25/2013  . Type 2 diabetes mellitus without complication, with  long-term current use of insulin (HCC) 09/25/2013  . Hyponatremia 09/25/2013    Past Surgical History:  Procedure Laterality Date  . BREAST REDUCTION SURGERY  2010  . DILATION AND CURETTAGE OF UTERUS  2007  . PANCREAS SURGERY  1997  . whiple  1997  . whipple's      OB History    No data available       Home Medications    Prior to Admission medications   Medication Sig Start Date End Date Taking? Authorizing Provider  albuterol (PROVENTIL HFA;VENTOLIN HFA) 108 (90 BASE) MCG/ACT inhaler Inhale 1-2 puffs into the lungs every 6 (six) hours as needed for wheezing. Asthma    Historical Provider, MD  beclomethasone (QVAR) 80 MCG/ACT inhaler Inhale 1 puff into the lungs 2 (two) times daily.     Historical Provider, MD  calcium carbonate (TUMS EX) 750 MG chewable tablet Chew 2-4 tablets by mouth 2 (two) times daily as needed for heartburn. No more than 10 a day    Historical Provider, MD  HYDROcodone-acetaminophen (NORCO/VICODIN) 5-325 MG per tablet Take 2 tablets by mouth every 6 (six) hours as needed for moderate pain.     Historical Provider, MD  ibuprofen (ADVIL,MOTRIN) 200 MG tablet Take 400-800 mg by mouth every 8 (eight) hours as needed for moderate pain.     Historical Provider, MD  insulin aspart (NOVOLOG) 100 UNIT/ML injection Inject 1-5 Units into the skin. 5-6  times daily with meals and extra if needed, injecting 1 unit for every 15 carbs, 1 unit every 50 over 150.    Historical Provider, MD  LANTUS SOLOSTAR 100 UNIT/ML Solostar Pen Inject 19 Units into the skin 2 (two) times daily. 12/26/15   Historical Provider, MD  LORazepam (ATIVAN) 1 MG tablet ONE HALF to 1 tab PO BID prn anxiety Patient taking differently: Take 0.5-1 mg by mouth 2 (two) times daily as needed for anxiety.  06/22/14   Samuel Jester, DO  Melatonin 5 MG TABS Take 1 tablet by mouth at bedtime.    Historical Provider, MD  Multiple Vitamins-Calcium (VIACTIV MULTI-VITAMIN) CHEW Chew 1 each by mouth daily.     Historical Provider, MD  Multiple Vitamins-Minerals (ALIVE WOMENS GUMMY PO) Take 2 tablets by mouth daily.    Historical Provider, MD  omeprazole (PRILOSEC) 20 MG capsule Take 20 mg by mouth every morning.     Historical Provider, MD  ondansetron (ZOFRAN) 4 MG tablet Take 1 tablet (4 mg total) by mouth every 8 (eight) hours as needed for nausea. As needed 01/07/16   Alison Murray, MD  OVER THE COUNTER MEDICATION Take 1 Dose by mouth at bedtime as needed (insomnia). Valarian/liqourice root tea    Historical Provider, MD  polyethylene glycol (MIRALAX / GLYCOLAX) packet Take 17 g by mouth daily as needed for mild constipation.    Historical Provider, MD  Probiotic Product (PROBIOTIC DAILY PO) Take 1 capsule by mouth daily. Reported on 01/16/2016    Historical Provider, MD  rizatriptan (MAXALT) 10 MG tablet Take 10 mg by mouth as needed for migraine. May repeat in 2 hours if needed for headaches    Historical Provider, MD  ZENPEP 25000 units CPEP Take 1-3 capsules by mouth 3 (three) times daily with meals as needed. 3-4 caps per meal and 1-2 caps per snack 12/03/15   Historical Provider, MD    Family History Family History  Problem Relation Age of Onset  . Asthma Brother   . Asthma Mother   . Pancreatic cancer Other   . Pancreatitis Paternal Grandfather   . Pancreatitis Maternal Uncle   . Migraines Sister     Social History Social History  Substance Use Topics  . Smoking status: Never Smoker  . Smokeless tobacco: Never Used  . Alcohol use No     Allergies   Nortriptyline hcl; Amitriptyline; Compazine [prochlorperazine maleate]; Cymbalta [duloxetine hcl]; Dexilant [dexlansoprazole]; Doxycycline; Flagyl [metronidazole hcl]; Penicillins cross reactors; Topamax [topiramate]; Amoxicillin; Inapsine [droperidol]; Ketamine; Levofloxacin; Lyrica [pregabalin]; Phenergan [promethazine hcl]; Phenothiazines; Primaxin [imipenem]; Prozac [fluoxetine hcl]; and Reglan [metoclopramide hcl]   Review of  Systems Review of Systems  Gastrointestinal: Positive for abdominal pain, nausea and vomiting.  All other systems reviewed and are negative.    Physical Exam Updated Vital Signs BP 135/96 (BP Location: Left Arm)   Pulse (!) 134   Temp 99.2 F (37.3 C) (Oral)   Resp 16   SpO2 98%   Physical Exam  Constitutional: She is oriented to person, place, and time. She appears well-developed and well-nourished.  HENT:  Head: Normocephalic and atraumatic.  Eyes: Conjunctivae and EOM are normal. Pupils are equal, round, and reactive to light.  Neck: Normal range of motion. Neck supple.  Cardiovascular: Normal rate and regular rhythm.  Exam reveals no gallop and no friction rub.   No murmur heard. Pulmonary/Chest: Effort normal and breath sounds normal. No respiratory distress. She has no wheezes. She has no rales. She exhibits no  tenderness.  Abdominal: Soft. Bowel sounds are normal. She exhibits no distension and no mass. There is tenderness. There is no rebound and no guarding.  Significant upper abdominal tenderness  Musculoskeletal: Normal range of motion. She exhibits no edema or tenderness.  Neurological: She is alert and oriented to person, place, and time.  Skin: Skin is warm and dry.  Psychiatric: She has a normal mood and affect. Her behavior is normal. Judgment and thought content normal.  Nursing note and vitals reviewed.    ED Treatments / Results  Labs (all labs ordered are listed, but only abnormal results are displayed) Labs Reviewed  CBG MONITORING, ED - Abnormal; Notable for the following:       Result Value   Glucose-Capillary 156 (*)    All other components within normal limits  LIPASE, BLOOD  COMPREHENSIVE METABOLIC PANEL  CBC  URINALYSIS, ROUTINE W REFLEX MICROSCOPIC (NOT AT [facility])  I-STAT BETA HCG BLOOD, ED ([facility], [facility], [facility] ONLY)    EKG  EKG Interpretation None       Radiology No results found.  Procedures Procedures (including critical care  time)  Medications Ordered in ED Medications  HYDROmorphone (DILAUDID) injection 1 mg (not administered)  sodium chloride 0.9 % bolus 1,000 mL (not administered)  ondansetron (ZOFRAN) injection 4 mg (not administered)  sodium chloride 0.9 % bolus 1,000 mL (not administered)     Initial Impression / Assessment and Plan / ED Course  I have reviewed the triage vital signs and the nursing notes.  Pertinent labs & imaging results that were available during my care of the patient were reviewed by me and considered in my medical decision making (see chart for details).  Clinical Course  Value Comment By Time  Albumin: 4.4 (Reviewed) Lorre NickAnthony Allen, MD 08/08 1944    Patient to be admitted for acute on chronic pancreatitis and elevated LFTs.    Final Clinical Impressions(s) / ED Diagnoses   Final diagnoses:  Acute pancreatitis, unspecified pancreatitis type  Elevated LFTs    New Prescriptions New Prescriptions   No medications on file     Roxy HorsemanRobert Johniya Durfee, PA-C 07/11/16 1603    Lorre NickAnthony Allen, MD 07/13/16 1454

## 2016-07-11 ENCOUNTER — Encounter (HOSPITAL_COMMUNITY): Payer: Self-pay | Admitting: *Deleted

## 2016-07-11 DIAGNOSIS — K760 Fatty (change of) liver, not elsewhere classified: Secondary | ICD-10-CM | POA: Insufficient documentation

## 2016-07-11 DIAGNOSIS — K858 Other acute pancreatitis without necrosis or infection: Secondary | ICD-10-CM | POA: Diagnosis not present

## 2016-07-11 DIAGNOSIS — R7989 Other specified abnormal findings of blood chemistry: Secondary | ICD-10-CM | POA: Diagnosis present

## 2016-07-11 DIAGNOSIS — E860 Dehydration: Secondary | ICD-10-CM | POA: Diagnosis not present

## 2016-07-11 DIAGNOSIS — R945 Abnormal results of liver function studies: Secondary | ICD-10-CM

## 2016-07-11 DIAGNOSIS — G43919 Migraine, unspecified, intractable, without status migrainosus: Secondary | ICD-10-CM

## 2016-07-11 DIAGNOSIS — J45901 Unspecified asthma with (acute) exacerbation: Secondary | ICD-10-CM

## 2016-07-11 DIAGNOSIS — G43019 Migraine without aura, intractable, without status migrainosus: Secondary | ICD-10-CM

## 2016-07-11 LAB — COMPREHENSIVE METABOLIC PANEL
ALBUMIN: 3.4 g/dL — AB (ref 3.5–5.0)
ALK PHOS: 187 U/L — AB (ref 38–126)
ALK PHOS: 180 U/L — AB (ref 38–126)
ALT: 360 U/L — AB (ref 14–54)
ALT: 337 U/L — ABNORMAL HIGH (ref 14–54)
ANION GAP: 7 (ref 5–15)
AST: 399 U/L — AB (ref 15–41)
AST: 290 U/L — ABNORMAL HIGH (ref 15–41)
Albumin: 3.4 g/dL — ABNORMAL LOW (ref 3.5–5.0)
Anion gap: 7 (ref 5–15)
BILIRUBIN TOTAL: 0.9 mg/dL (ref 0.3–1.2)
BUN: 11 mg/dL (ref 6–20)
BUN: 11 mg/dL (ref 6–20)
CALCIUM: 8.2 mg/dL — AB (ref 8.9–10.3)
CHLORIDE: 102 mmol/L (ref 101–111)
CO2: 26 mmol/L (ref 22–32)
CO2: 26 mmol/L (ref 22–32)
CREATININE: 0.5 mg/dL (ref 0.44–1.00)
Calcium: 8.3 mg/dL — ABNORMAL LOW (ref 8.9–10.3)
Chloride: 101 mmol/L (ref 101–111)
Creatinine, Ser: 0.48 mg/dL (ref 0.44–1.00)
GFR calc non Af Amer: >60 mL/min (ref >60)
GFR calc non Af Amer: >60 mL/min (ref >60)
GLUCOSE: 122 mg/dL — AB (ref 65–99)
Glucose, Bld: 127 mg/dL — ABNORMAL HIGH (ref 65–99)
Potassium: 3.6 mmol/L (ref 3.5–5.1)
Potassium: 3.7 mmol/L (ref 3.5–5.1)
SODIUM: 135 mmol/L (ref 135–145)
SODIUM: 134 mmol/L — AB (ref 135–145)
TOTAL PROTEIN: 7.1 g/dL (ref 6.5–8.1)
Total Bilirubin: 1.1 mg/dL (ref 0.3–1.2)
Total Protein: 7 g/dL (ref 6.5–8.1)

## 2016-07-11 LAB — CBC
HCT: 29.8 % — ABNORMAL LOW (ref 36.0–46.0)
HEMOGLOBIN: 9.4 g/dL — AB (ref 12.0–15.0)
MCH: 22.9 pg — AB (ref 26.0–34.0)
MCHC: 31.5 g/dL (ref 30.0–36.0)
MCV: 72.7 fL — ABNORMAL LOW (ref 78.0–100.0)
PLATELETS: 417 10*3/uL — AB (ref 150–400)
RBC: 4.1 MIL/uL (ref 3.87–5.11)
RDW: 16.2 % — ABNORMAL HIGH (ref 11.5–15.5)
WBC: 12.4 10*3/uL — AB (ref 4.0–10.5)

## 2016-07-11 LAB — GLUCOSE, CAPILLARY
Glucose-Capillary: 119 mg/dL — ABNORMAL HIGH (ref 65–99)
Glucose-Capillary: 123 mg/dL — ABNORMAL HIGH (ref 65–99)

## 2016-07-11 LAB — PROTIME-INR
INR: 1.06
PROTHROMBIN TIME: 13.8 s (ref 11.4–15.2)

## 2016-07-11 LAB — LIPASE, BLOOD: Lipase: 12 U/L (ref 11–51)

## 2016-07-11 MED ORDER — SODIUM CHLORIDE 0.9 % IV BOLUS (SEPSIS): 1000.0000 mL | INTRAVENOUS | Status: DC | PRN

## 2016-07-11 MED ORDER — DEXTROSE 5 % IV SOLN
2.0000 g | Freq: Three times a day (TID) | INTRAVENOUS | Status: DC
  Filled 2016-07-11: qty 2

## 2016-07-11 MED ORDER — INSULIN GLARGINE 100 UNIT/ML ~~LOC~~ SOLN
12.0000 [IU] | Freq: Two times a day (BID) | SUBCUTANEOUS | Status: DC
  Administered 2016-07-11: 12 [IU] via SUBCUTANEOUS
  Filled 2016-07-11 (×2): qty 0.12

## 2016-07-11 MED ORDER — MAGNESIUM SULFATE 2 GM/50ML IV SOLN
2.0000 g | Freq: Once | INTRAVENOUS | Status: AC
  Administered 2016-07-11: 2 g via INTRAVENOUS
  Filled 2016-07-11: qty 50

## 2016-07-11 MED ORDER — ONDANSETRON HCL 4 MG/2ML IJ SOLN
4.0000 mg | INTRAMUSCULAR | Status: DC | PRN
  Administered 2016-07-11 (×3): 4 mg via INTRAVENOUS
  Filled 2016-07-11 (×3): qty 2

## 2016-07-11 MED ORDER — HYDROMORPHONE HCL 2 MG/ML IJ SOLN
2.0000 mg | INTRAMUSCULAR | Status: DC | PRN
  Administered 2016-07-11 (×3): 2 mg via INTRAVENOUS
  Filled 2016-07-11 (×3): qty 1

## 2016-07-11 MED ORDER — CLINDAMYCIN PHOSPHATE 900 MG/50ML IV SOLN
900.0000 mg | Freq: Three times a day (TID) | INTRAVENOUS | Status: DC
  Filled 2016-07-11: qty 50

## 2016-07-11 MED ORDER — HYDROMORPHONE HCL 2 MG/ML IJ SOLN
2.0000 mg | INTRAMUSCULAR | Status: DC | PRN
  Administered 2016-07-11 (×2): 2 mg via INTRAVENOUS
  Filled 2016-07-11 (×2): qty 1

## 2016-07-11 MED ORDER — FAMOTIDINE IN NACL 20-0.9 MG/50ML-% IV SOLN
20.0000 mg | Freq: Two times a day (BID) | INTRAVENOUS | Status: DC
  Administered 2016-07-11: 20 mg via INTRAVENOUS
  Filled 2016-07-11 (×2): qty 50

## 2016-07-11 MED ORDER — HYDROCODONE-ACETAMINOPHEN 5-325 MG PO TABS: 2.0000 | ORAL_TABLET | Freq: Four times a day (QID) | ORAL | Status: DC | PRN

## 2016-07-11 MED ORDER — BISACODYL 10 MG RE SUPP: 10.0000 mg | Freq: Every day | RECTAL | Status: DC | PRN

## 2016-07-11 MED ORDER — HYDROMORPHONE HCL 2 MG/ML IJ SOLN
INTRAMUSCULAR | Status: AC
  Filled 2016-07-11: qty 1

## 2016-07-11 MED ORDER — SODIUM CHLORIDE 0.9 % IV BOLUS (SEPSIS)
500.0000 mL | Freq: Once | INTRAVENOUS | Status: AC
  Administered 2016-07-11: 500 mL via INTRAVENOUS

## 2016-07-11 MED ORDER — CLINDAMYCIN PHOSPHATE 900 MG/50ML IV SOLN
900.0000 mg | Freq: Once | INTRAVENOUS | Status: AC
  Administered 2016-07-11: 900 mg via INTRAVENOUS
  Filled 2016-07-11: qty 50

## 2016-07-11 MED ORDER — LORAZEPAM 1 MG PO TABS: 1.0000 mg | ORAL_TABLET | Freq: Two times a day (BID) | ORAL | Status: DC | PRN

## 2016-07-11 MED ORDER — SUMATRIPTAN SUCCINATE 50 MG PO TABS
50.0000 mg | ORAL_TABLET | ORAL | Status: DC | PRN
  Filled 2016-07-11: qty 1

## 2016-07-11 MED ORDER — SODIUM CHLORIDE 0.9 % IV SOLN
INTRAVENOUS | Status: DC
  Administered 2016-07-11: 05:00:00 via INTRAVENOUS

## 2016-07-11 MED ORDER — DEXTROSE 5 % IV SOLN
2.0000 g | Freq: Once | INTRAVENOUS | Status: AC
  Administered 2016-07-11: 2 g via INTRAVENOUS
  Filled 2016-07-11: qty 2

## 2016-07-11 MED ORDER — POLYETHYLENE GLYCOL 3350 17 G PO PACK: 17.0000 g | PACK | Freq: Every day | ORAL | Status: DC

## 2016-07-11 NOTE — H&P (Signed)
History and Physical    Heather FlingRebekah Wagner ZOX:[id number] DOB: [birth year] DOA: 07/10/2016  PCP: Clelia SchaumannIbethal J Shamleffer, MD   Patient coming from: Home.  Chief Complaint: Abdominal pain.  HPI: Heather FlingRebekah Heather Wagner is a 40 y.o. female with medical history significant of asthma, diabetes mellitus, fibromyalgia, hyperlipidemia, migraine headaches, Sjogren's syndrome, chronic pancreatitis with partial pancreatectomy who comes to the emergency department with complaints of abdominal pain, nausea, several episodes of emesis and migraine headache typical of her chronic pancreatitis exacerbation. She denies diarrhea, melena, hematochezia, GU symptoms, fever, chills, but states that she feels fatigued. She denies chest pain, dyspnea, palpitations, diaphoresis, pitting edema of the lower extremities. She states that [facility]  ED Course: The patient received IV fluids, analgesics and antiemetics which she states have provided some relief. Workup is significant for leukocytosis of 18.3 K, elevated LFTs, CT scan of the abdomen showing progressive hepatic steatosis and moderate stool burden with rectal distention.  Review of Systems: As per HPI otherwise 10 point review of systems negative.    Past Medical History:  Diagnosis Date  . Asthma   . Chronic sinusitis   . Diabetes mellitus   . Fibromyalgia   . Hyperlipidemia   . IBS (irritable bowel syndrome)   . Migraine without aura, with intractable migraine, so stated, without mention of status migrainosus 01/25/2014  . Migraines   . Pancreatitis chronic   . Sjogren's syndrome (HCC)   . Vulvar vestibulitis     Past Surgical History:  Procedure Laterality Date  . BREAST REDUCTION SURGERY  2010  . DILATION AND CURETTAGE OF UTERUS  2007  . PANCREAS SURGERY  1997  . whiple  1997  . whipple's       reports that she has never smoked. She has never used smokeless tobacco. She reports that she does not drink alcohol or use  drugs.  Allergies  Allergen Reactions  . Nortriptyline Hcl Shortness Of Breath    Swelling anxiety  . Amitriptyline Other (See Comments)    sedation  . Compazine [Prochlorperazine Maleate] Other (See Comments)    Neurological - uncontrollable eye movements  . Cymbalta [Duloxetine Hcl] Nausea And Vomiting    insomnia  . Dexilant [Dexlansoprazole] Nausea Only  . Doxycycline Nausea And Vomiting  . Flagyl [Metronidazole Hcl] Nausea And Vomiting    Skin rash  . Gabapentin Nausea And Vomiting and Other (See Comments)    Agitation  . Penicillins Cross Reactors Nausea And Vomiting    Skin rash/hives  . Tizanidine Nausea And Vomiting and Other (See Comments)    Agitation  . Topamax [Topiramate] Other (See Comments)    Mood change, anxiety, headaches  . Toradol [Ketorolac Tromethamine] Other (See Comments)    Anxiety.  . Amoxicillin Nausea And Vomiting and Anxiety    Has patient had a PCN reaction causing immediate rash, facial/tongue/throat swelling, SOB or lightheadedness with hypotension:  Has patient had a PCN reaction causing severe rash involving mucus membranes or skin necrosis:  Has patient had a PCN reaction that required hospitalization  Has patient had a PCN reaction occurring within the last 10 years:  If all of the above answers are "NO", then may proceed with Cephalosporin use.   Trudie Reed. Inapsine [Droperidol] Nausea And Vomiting  . Ketamine Anxiety    Agitation, insomnia   . Levofloxacin Palpitations and Other (See Comments)    Light headed, anxiety, blood rushing, shaking, sweating.l   . Lyrica [Pregabalin] Other (See Comments)    Mood  changes- become listless, disinterested  . Phenergan [Promethazine Hcl] Anxiety  . Phenothiazines Hives, Nausea And Vomiting and Rash  . Primaxin [Imipenem] Nausea And Vomiting  . Prozac [Fluoxetine Hcl] Anxiety  . Reglan [Metoclopramide Hcl] Anxiety    Family History  Problem Relation Age of Onset  . Asthma Mother   . Migraines Sister    . Pancreatitis Paternal Grandfather   . Asthma Brother   . Pancreatic cancer Other   . Pancreatitis Maternal Uncle   Family history reviewed and updated with the patient.  Prior to Admission medications   Medication Sig Start Date End Date Taking? Authorizing Provider  albuterol (PROVENTIL HFA;VENTOLIN HFA) 108 (90 BASE) MCG/ACT inhaler Inhale 1-2 puffs into the lungs every 6 (six) hours as needed for wheezing. Asthma   Yes Historical Provider, MD  beclomethasone (QVAR) 80 MCG/ACT inhaler Inhale 1 puff into the lungs 2 (two) times daily.    Yes Historical Provider, MD  calcium carbonate (TUMS EX) 750 MG chewable tablet Chew 2-4 tablets by mouth 2 (two) times daily as needed for heartburn. No more than 10 a day   Yes Historical Provider, MD  HYDROcodone-acetaminophen (NORCO/VICODIN) 5-325 MG per tablet Take 2 tablets by mouth every 6 (six) hours as needed for moderate pain.    Yes Historical Provider, MD  ibuprofen (ADVIL,MOTRIN) 200 MG tablet Take 400-800 mg by mouth every 8 (eight) hours as needed for moderate pain.    Yes Historical Provider, MD  insulin aspart (NOVOLOG) 100 UNIT/ML injection Inject 1-5 Units into the skin. 5-6 times daily with meals and extra if needed, injecting 1 unit for every 15 carbs, 1 unit every 50 over 150.   Yes Historical Provider, MD  LANTUS SOLOSTAR 100 UNIT/ML Solostar Pen Inject 19 Units into the skin 2 (two) times daily. 12/26/15  Yes Historical Provider, MD  LORazepam (ATIVAN) 1 MG tablet ONE HALF to 1 tab PO BID prn anxiety Patient taking differently: Take 1 mg by mouth 2 (two) times daily as needed for anxiety.  06/22/14  Yes Samuel Jester, DO  Magnesium 300 MG CAPS Take 1 capsule by mouth every evening.   Yes Historical Provider, MD  Melatonin 5 MG TABS Take 1 tablet by mouth at bedtime.   Yes Historical Provider, MD  Multiple Vitamins-Calcium (VIACTIV MULTI-VITAMIN) CHEW Chew 1 each by mouth every evening.    Yes Historical Provider, MD  Multiple  Vitamins-Minerals (ALIVE WOMENS GUMMY PO) Take 2 tablets by mouth every evening.    Yes Historical Provider, MD  omeprazole (PRILOSEC) 20 MG capsule Take 20 mg by mouth every morning.    Yes Historical Provider, MD  ondansetron (ZOFRAN) 4 MG tablet Take 1 tablet (4 mg total) by mouth every 8 (eight) hours as needed for nausea. As needed 01/07/16  Yes Alison Murray, MD  OVER THE COUNTER MEDICATION Take 1 Dose by mouth at bedtime as needed (insomnia). Valarian/liqourice root tea   Yes Historical Provider, MD  polyethylene glycol (MIRALAX / GLYCOLAX) packet Take 17 g by mouth daily as needed for mild constipation.   Yes Historical Provider, MD  Probiotic Product (PROBIOTIC DAILY PO) Take 1 capsule by mouth daily as needed (prevent infection). Reported on 01/16/2016   Yes Historical Provider, MD  rizatriptan (MAXALT) 10 MG tablet Take 10 mg by mouth as needed for migraine. May repeat in 2 hours if needed for headaches   Yes Historical Provider, MD  ZENPEP 25000 units CPEP Take 1-3 capsules by mouth 3 (three) times daily with  meals as needed. 3-4 caps per meal and 1-2 caps per snack 12/03/15  Yes Historical Provider, MD  albuterol (PROVENTIL HFA;VENTOLIN HFA) 108 (90 Base) MCG/ACT inhaler  03/21/11   Historical Provider, MD  rizatriptan (MAXALT) 10 MG tablet  03/21/11   Historical Provider, MD    Physical Exam: Vitals:   07/11/16 0238 07/11/16 0257 07/11/16 1405 07/11/16 1540  BP: 116/72 (!) 144/89  (!) 130/92  Pulse: 85 95 71   Resp: 13 14 14    Temp:  98.7 F (37.1 C) 98.7 F (37.1 C)   TempSrc:  Oral Oral   SpO2: 95% 98% 96%   Weight:      Height:          Constitutional: Looks acutely ill. Vitals:   07/11/16 0238 07/11/16 0257 07/11/16 1405 07/11/16 1540  BP: 116/72 (!) 144/89  (!) 130/92  Pulse: 85 95 71   Resp: 13 14 14    Temp:  98.7 F (37.1 C) 98.7 F (37.1 C)   TempSrc:  Oral Oral   SpO2: 95% 98% 96%   Weight:      Height:       Eyes: PERRL, lids and conjunctivae  normal ENMT: Mucous membranes are mildly dry.              Posterior pharynx clear of any exudate or lesions. Neck: normal, supple, no masses, no thyromegaly Respiratory: clear to auscultation bilaterally, no wheezing, no crackles. Normal respiratory effort. No accessory muscle use.  Cardiovascular: Tachycardic at 112 bpm, no murmurs / rubs / gallops. No extremity edema. 2+ pedal pulses. No carotid bruits.  Abdomen:  BS positive. Mild diffuse tenderness without guarding/rebound, no masses palpated. No hepatosplenomegaly. Musculoskeletal: no clubbing / cyanosis. No joint deformity upper and lower extremities. Good ROM, no contractures. Normal muscle tone.  Skin: no rashes, lesions, ulcers. No induration Neurologic: CN 2-12 grossly intact. Sensation intact, DTR normal. Strength 5/5 in all 4.  Psychiatric: Normal judgment and insight. Alert and oriented x 4. Normal mood.     Labs on Admission: I have personally reviewed following labs and imaging studies  CBC:  Recent Labs Lab 07/10/16 1844 07/11/16 0604  WBC 18.3* 12.4*  HGB 11.5* 9.4*  HCT 36.5 29.8*  MCV 72.1* 72.7*  PLT 488* 417*   Basic Metabolic Panel:  Recent Labs Lab 07/10/16 1844 07/11/16 0604 07/11/16 1331  NA 135 135 134*  K 3.8 3.6 3.7  CL 97* 102 101  CO2 27 26 26   GLUCOSE 129* 122* 127*  BUN 16 11 11   CREATININE 0.64 0.50 0.48  CALCIUM 10.5* 8.3* 8.2*   GFR: Estimated Creatinine Clearance: 91.8 mL/min (by C-G formula based on SCr of 0.8 mg/dL). Liver Function Tests:  Recent Labs Lab 07/10/16 1844 07/11/16 0604 07/11/16 1331  AST 847* 399* 290*  ALT 520* 360* 337*  ALKPHOS 241* 187* 180*  BILITOT 1.4* 1.1 0.9  PROT 8.2* 7.0 7.1  ALBUMIN 4.4 3.4* 3.4*    Recent Labs Lab 07/10/16 1844 07/11/16 0604  LIPASE 14 12   Coagulation Profile:  Recent Labs Lab 07/11/16 0604  INR 1.06   CBG:  Recent Labs Lab 07/10/16 1844 07/11/16 0623 07/11/16 1159  GLUCAP 156* 119* 123*   Urine  analysis:    Component Value Date/Time   COLORURINE YELLOW 07/10/2016 1828   APPEARANCEUR CLEAR 07/10/2016 1828   LABSPEC 1.022 07/10/2016 1828   PHURINE 8.0 07/10/2016 1828   GLUCOSEU NEGATIVE 07/10/2016 1828   HGBUR NEGATIVE 07/10/2016 1828  BILIRUBINUR NEGATIVE 07/10/2016 1828   KETONESUR NEGATIVE 07/10/2016 1828   PROTEINUR NEGATIVE 07/10/2016 1828   UROBILINOGEN 0.2 06/29/2015 0021   NITRITE NEGATIVE 07/10/2016 1828   LEUKOCYTESUR NEGATIVE 07/10/2016 1828    Radiological Exams on Admission: Ct Abdomen Pelvis W Contrast  Result Date: 07/10/2016 CLINICAL DATA:  Pancreatitis, markedly elevated LFTs. Abdominal pain, nausea, emesis. EXAM: CT ABDOMEN AND PELVIS WITH CONTRAST TECHNIQUE: Multidetector CT imaging of the abdomen and pelvis was performed using the standard protocol following bolus administration of intravenous contrast. CONTRAST:  ISOVUE-300 IOPAMIDOL (ISOVUE-300) INJECTION 61% COMPARISON:  Most recent comparison 01/05/2016 FINDINGS: Lower chest:  Mild dependent atelectasis.  No pleural effusion. Liver: Decreased density consistent with steatosis. This appears progressed from prior CT. The previous subcapsular hypodensity in segment 6 on prior exam is partially obscured by the adjacent steatosis. Left lobe pneumobilia, suspect prior hepatojejunostomy. Hepatobiliary: Postcholecystectomy.  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 Pancreas: Post partial pancreatectomy. Pancreatic duct measures 3 mm, and decreased from prior. There is no peripancreatic inflammation or fat stranding. No peripancreatic fluid collection. Spleen: Normal. Adrenal glands: No nodule. Kidneys: Symmetric renal enhancement and excretion. No hydronephrosis. Stomach/Bowel: Stomach physiologically distended. There are no dilated or thickened small bowel loops. Moderate volume of stool throughout the colon without colonic wall thickening. Stool distends the rectum. The appendix is tentatively identified and normal. No  pericecal or right lower quadrant inflammation. Vascular/Lymphatic: Prominent mesenteric lymph nodes, likely reactive. No retroperitoneal adenopathy. Abdominal aorta is normal in caliber. Reproductive: Right uterine fibroid measures 3.9 cm. No adnexal mass. Bladder: Physiologically distended without wall thickening. Other: No free air, free fluid, or intra-abdominal fluid collection. Musculoskeletal: There are no acute or suspicious osseous abnormalities. IMPRESSION: 1. Post partial pancreatectomy.  No acute pancreatic inflammation. 2. Progressive hepatic steatosis from prior. Pneumobilia is chronic and unchanged. 3. Moderate stool burden with stool distending the rectum, question constipation. Electronically Signed   By: Rubye Oaks M.D.   On: 07/10/2016 22:03     Assessment/Plan Principal Problem:   Pancreatitis, acute Admit to telemetry/observation Keep nothing by mouth. Continue IV fluids. Continue  analgesics as needed. Continue antiemetics as needed. Follow-up lipase, CBC and CMP in the morning. The patient declined MRCP due to previous severe claustrophobia while having MRIs. Possible GI evaluation in the morning    Elevated LFTs As above. Briefly explained to the patient the possibility of ERCP.  Active Problems:   Type 2 diabetes mellitus without complication, with long-term current use of insulin (HCC) Continue long-acting insulin to avoid DKA, since given the patient's history of pancreatectomy, her diabetes may behave more like type I then type 2. Hold sliding scale insulin while the patient is nothing by mouth.    Intractable migraine without aura As symptomatic at this time. Continue Maxalt as needed.    Dehydration Continue IV fluids. Follow-up electrolytes and renal function.         DVT prophylaxis: SCDs. Code Status: Full code. Family Communication:  Disposition Plan: Admit for pain control, lab follow-up, possible GI evaluation with ERCP in  a.m. Consults called:  Admission status: Observation/telemetry.   Bobette Mo MD [facility] Pager [phone].  If 7PM-7AM, please contact night-coverage [email or web address] Password [facility]  07/11/2016, 6:38 PM

## 2016-07-11 NOTE — Discharge Summary (Signed)
Physician Discharge Summary  Heather Wagner ZOX:[id number] DOB: [birth year] DOA: 07/10/2016  PCP: Clelia Schaumann, MD  Admit date: 07/10/2016 Discharge date: 07/11/2016  Admitted From: home   Disposition:  home   Recommendations for Outpatient Follow-up:  1. High risk for readmission- I am discharging the patient on her request  Discharge Condition:  stable   CODE STATUS:  Full code   Diet recommendation:  Start with liquids and advance slowly to solids Consultations:  none    Discharge Diagnoses:  Principal Problem:   Pancreatitis, acute Active Problems:   Type 2 diabetes mellitus without complication, with long-term current use of insulin (HCC)   Intractable migraine without aura   Dehydration   Fatty liver   Elevated LFTs    Subjective: States that her pancreatitis pain has resolved but her Migraine is severe. In the past, she has noted that the hospital air makes her Migraine worse and is now requesting to be discharged home although she is still vomiting. She attributes the vomiting to her Migraine and feels it will all resolve once she is home. Will return to the ER if it does not resolve but is essentially repeatedly asking me to allow her to discharge home.   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 She woke up suddenly on the day of admission with abdominal pain which was followed by vomiting. She subsequently developed a unilateral migraine and has continued to vomit despite antiemetics.   Hospital Course:  Active Problems:   Pancreatitis, acute - IVF, pain and nausea management - she states her specialist at [facility] is considering a total pancreatectomy due to her frequent exacerbations  Migraine  - allergic to numerous medications which would typically be given for Migraines- takes Maxalt at home - as mentioned above, she feels that  going home will be the only way her current Migraine will resolve  Dehydration - increase NS IV and given a bolus today  Fatty liver - chronic  Elevated LFTs - may be related to the pancreatitis?? Improving  IBS- constipation type - Miralax and Dulcolax as needed  Asthma - resume home inhalers  Anxiety - PRN Ativan- does not use it frequently  Discharge Instructions  Discharge Instructions    Discharge instructions    Complete by:  As directed   Start with clear liquids and advance slowly to full liquids and then in 2-3 days to solid food.   Increase activity slowly    Complete by:  As directed       Medication List    TAKE these medications   albuterol 108 (90 Base) MCG/ACT inhaler Commonly known as:  PROVENTIL HFA;VENTOLIN HFA Inhale 1-2 puffs into the lungs every 6 (six) hours as needed for wheezing. Asthma   albuterol 108 (90 Base) MCG/ACT inhaler Commonly known as:  PROVENTIL HFA;VENTOLIN HFA   ALIVE WOMENS GUMMY PO Take 2 tablets by mouth every evening.   beclomethasone 80 MCG/ACT inhaler Commonly known as:  QVAR Inhale 1 puff into the lungs 2 (two) times daily.   calcium carbonate 750 MG chewable tablet Commonly known as:  TUMS EX Chew 2-4 tablets by mouth 2 (two) times daily as needed for heartburn. No more than 10 a day   HYDROcodone-acetaminophen 5-325 MG tablet Commonly known as:  NORCO/VICODIN Take 2 tablets by mouth every 6 (six) hours as needed for moderate pain.   ibuprofen 200 MG  tablet Commonly known as:  ADVIL,MOTRIN Take 400-800 mg by mouth every 8 (eight) hours as needed for moderate pain.   insulin aspart 100 UNIT/ML injection Commonly known as:  novoLOG Inject 1-5 Units into the skin. 5-6 times daily with meals and extra if needed, injecting 1 unit for every 15 carbs, 1 unit every 50 over 150.   LANTUS SOLOSTAR 100 UNIT/ML Solostar Pen Generic drug:  Insulin Glargine Inject 19 Units into the skin 2 (two) times daily.    LORazepam 1 MG tablet Commonly known as:  ATIVAN ONE HALF to 1 tab PO BID prn anxiety What changed:  how much to take  how to take this  when to take this  reasons to take this  additional instructions   Magnesium 300 MG Caps Take 1 capsule by mouth every evening.   Melatonin 5 MG Tabs Take 1 tablet by mouth at bedtime.   omeprazole 20 MG capsule Commonly known as:  PRILOSEC Take 20 mg by mouth every morning.   ondansetron 4 MG tablet Commonly known as:  ZOFRAN Take 1 tablet (4 mg total) by mouth every 8 (eight) hours as needed for nausea. As needed   OVER THE COUNTER MEDICATION Take 1 Dose by mouth at bedtime as needed (insomnia). Valarian/liqourice root tea   polyethylene glycol packet Commonly known as:  MIRALAX / GLYCOLAX Take 17 g by mouth daily as needed for mild constipation.   PROBIOTIC DAILY PO Take 1 capsule by mouth daily as needed (prevent infection). Reported on 01/16/2016   rizatriptan 10 MG tablet Commonly known as:  MAXALT Take 10 mg by mouth as needed for migraine. May repeat in 2 hours if needed for headaches   rizatriptan 10 MG tablet Commonly known as:  MAXALT   VIACTIV MULTI-VITAMIN Chew Chew 1 each by mouth every evening.   ZENPEP 25000 units Cpep Generic drug:  Pancrelipase (Lip-Prot-Amyl) Take 1-3 capsules by mouth 3 (three) times daily with meals as needed. 3-4 caps per meal and 1-2 caps per snack       Allergies  Allergen Reactions  . Nortriptyline Hcl Shortness Of Breath    Swelling anxiety  . Amitriptyline Other (See Comments)    sedation  . Compazine [Prochlorperazine Maleate] Other (See Comments)    Neurological - uncontrollable eye movements  . Cymbalta [Duloxetine Hcl] Nausea And Vomiting    insomnia  . Dexilant [Dexlansoprazole] Nausea Only  . Doxycycline Nausea And Vomiting  . Flagyl [Metronidazole Hcl] Nausea And Vomiting    Skin rash  . Gabapentin Nausea And Vomiting and Other (See Comments)    Agitation  .  Penicillins Cross Reactors Nausea And Vomiting    Skin rash/hives  . Tizanidine Nausea And Vomiting and Other (See Comments)    Agitation  . Topamax [Topiramate] Other (See Comments)    Mood change, anxiety, headaches  . Toradol [Ketorolac Tromethamine] Other (See Comments)    Anxiety.  . Amoxicillin Nausea And Vomiting and Anxiety    Has patient had a PCN reaction causing immediate rash, facial/tongue/throat swelling, SOB or lightheadedness with hypotension:  Has patient had a PCN reaction causing severe rash involving mucus membranes or skin necrosis:  Has patient had a PCN reaction that required hospitalization  Has patient had a PCN reaction occurring within the last 10 years:  If all of the above answers are "NO", then may proceed with Cephalosporin use.   Trudie Reed. Inapsine [Droperidol] Nausea And Vomiting  . Ketamine Anxiety    Agitation, insomnia   .  Levofloxacin Palpitations and Other (See Comments)    Light headed, anxiety, blood rushing, shaking, sweating.l   . Lyrica [Pregabalin] Other (See Comments)    Mood changes- become listless, disinterested  . Phenergan [Promethazine Hcl] Anxiety  . Phenothiazines Hives, Nausea And Vomiting and Rash  . Primaxin [Imipenem] Nausea And Vomiting  . Prozac [Fluoxetine Hcl] Anxiety  . Reglan [Metoclopramide Hcl] Anxiety     Procedures/Studies:  Ct Abdomen Pelvis W Contrast  Result Date: 07/10/2016 CLINICAL DATA:  Pancreatitis, markedly elevated LFTs. Abdominal pain, nausea, emesis. EXAM: CT ABDOMEN AND PELVIS WITH CONTRAST TECHNIQUE: Multidetector CT imaging of the abdomen and pelvis was performed using the standard protocol following bolus administration of intravenous contrast. CONTRAST:  ISOVUE-300 IOPAMIDOL (ISOVUE-300) INJECTION 61% COMPARISON:  Most recent comparison 01/05/2016 FINDINGS: Lower chest:  Mild dependent atelectasis.  No pleural effusion. Liver: Decreased density consistent with steatosis. This appears progressed from  prior CT. The previous subcapsular hypodensity in segment 6 on prior exam is partially obscured by the adjacent steatosis. Left lobe pneumobilia, suspect prior hepatojejunostomy. Hepatobiliary: Postcholecystectomy.  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 Pancreas: Post partial pancreatectomy. Pancreatic duct measures 3 mm, and decreased from prior. There is no peripancreatic inflammation or fat stranding. No peripancreatic fluid collection. Spleen: Normal. Adrenal glands: No nodule. Kidneys: Symmetric renal enhancement and excretion. No hydronephrosis. Stomach/Bowel: Stomach physiologically distended. There are no dilated or thickened small bowel loops. Moderate volume of stool throughout the colon without colonic wall thickening. Stool distends the rectum. The appendix is tentatively identified and normal. No pericecal or right lower quadrant inflammation. Vascular/Lymphatic: Prominent mesenteric lymph nodes, likely reactive. No retroperitoneal adenopathy. Abdominal aorta is normal in caliber. Reproductive: Right uterine fibroid measures 3.9 cm. No adnexal mass. Bladder: Physiologically distended without wall thickening. Other: No free air, free fluid, or intra-abdominal fluid collection. Musculoskeletal: There are no acute or suspicious osseous abnormalities. IMPRESSION: 1. Post partial pancreatectomy.  No acute pancreatic inflammation. 2. Progressive hepatic steatosis from prior. Pneumobilia is chronic and unchanged. 3. Moderate stool burden with stool distending the rectum, question constipation. Electronically Signed   By: Rubye Oaks M.D.   On: 07/10/2016 22:03       Discharge Exam: Vitals:   07/11/16 1405 07/11/16 1540  BP:  (!) 130/92  Pulse: 71   Resp: 14   Temp: 98.7 F (37.1 C)    Vitals:   07/11/16 0238 07/11/16 0257 07/11/16 1405 07/11/16 1540  BP: 116/72 (!) 144/89  (!) 130/92  Pulse: 85 95 71   Resp: Temp:  98.7 F (37.1 C) 98.7 F (37.1 C)   TempSrc:  Oral Oral   SpO2:  95% 98% 96%   Weight:      Height:        General: Pt is alert, awake, not in acute distress Cardiovascular: RRR, S1/S2 +, no rubs, no gallops Respiratory: CTA bilaterally, no wheezing, no rhonchi Abdominal: Soft, NT, ND, bowel sounds + Extremities: no edema, no cyanosis    The results of significant diagnostics from this hospitalization (including imaging, microbiology, ancillary and laboratory) are listed below for reference.     Microbiology: No results found for this or any previous visit (from the past 240 hour(s)).   Labs: BNP (last 3 results) No results for input(s): BNP in the last 8760 hours. Basic Metabolic Panel:  Recent Labs Lab 07/10/16 1844 07/11/16 0604 07/11/16 1331  NA 135 135 134*  K 3.8 3.6 3.7  CL 97* 102 101  CO2 27 26  26  GLUCOSE 129* 122* 127*  BUN 16 11 11   CREATININE 0.64 0.50 0.48  CALCIUM 10.5* 8.3* 8.2*   Liver Function Tests:  Recent Labs Lab 07/10/16 1844 07/11/16 0604 07/11/16 1331  AST 847* 399* 290*  ALT 520* 360* 337*  ALKPHOS 241* 187* 180*  BILITOT 1.4* 1.1 0.9  PROT 8.2* 7.0 7.1  ALBUMIN 4.4 3.4* 3.4*    Recent Labs Lab 07/10/16 1844 07/11/16 0604  LIPASE 14 12   No results for input(s): AMMONIA in the last 168 hours. CBC:  Recent Labs Lab 07/10/16 1844 07/11/16 0604  WBC 18.3* 12.4*  HGB 11.5* 9.4*  HCT 36.5 29.8*  MCV 72.1* 72.7*  PLT 488* 417*   Cardiac Enzymes: No results for input(s): CKTOTAL, CKMB, CKMBINDEX, TROPONINI in the last 168 hours. BNP: Invalid input(s): POCBNP CBG:  Recent Labs Lab 07/10/16 1844 07/11/16 0623 07/11/16 1159  GLUCAP 156* 119* 123*   D-Dimer No results for input(s): DDIMER in the last 72 hours. Hgb A1c No results for input(s): HGBA1C in the last 72 hours. Lipid Profile No results for input(s): CHOL, HDL, LDLCALC, TRIG, CHOLHDL, LDLDIRECT in the last 72 hours. Thyroid function studies No results for input(s): TSH, T4TOTAL, T3FREE, THYROIDAB in the last 72  hours.  Invalid input(s): FREET3 Anemia work up No results for input(s): VITAMINB12, FOLATE, FERRITIN, TIBC, IRON, RETICCTPCT in the last 72 hours. Urinalysis    Component Value Date/Time   COLORURINE YELLOW 07/10/2016 1828   APPEARANCEUR CLEAR 07/10/2016 1828   LABSPEC 1.022 07/10/2016 1828   PHURINE 8.0 07/10/2016 1828   GLUCOSEU NEGATIVE 07/10/2016 1828   HGBUR NEGATIVE 07/10/2016 1828   BILIRUBINUR NEGATIVE 07/10/2016 1828   KETONESUR NEGATIVE 07/10/2016 1828   PROTEINUR NEGATIVE 07/10/2016 1828   UROBILINOGEN 0.2 06/29/2015 0021   NITRITE NEGATIVE 07/10/2016 1828   LEUKOCYTESUR NEGATIVE 07/10/2016 1828   Sepsis Labs Invalid input(s): PROCALCITONIN,  WBC,  LACTICIDVEN Microbiology No results found for this or any previous visit (from the past 240 hour(s)).   Time coordinating discharge: Over 30 minutes  SIGNED:   Calvert Cantor, MD  [facility] 07/11/2016, 5:10 PM Pager   If 7PM-7AM, please contact night-coverage [email or web address] Password [email or web address]

## 2016-07-11 NOTE — Progress Notes (Signed)
Pt complaining of migraine headache unrelieved by dilaudid 2mg  q2h. Migraine has persisted since 0700 this morning. Pt states "the air in the room" is causing migraine headaches, and the only thing that will make it better in going home. Pt requested hospitalist, Dr. Butler Denmarkizwan, to d/c pt home. Pt has not tolerated anything PO. 1 episode of emesis this morning. Pt is stating she was admitted for pain management of "pancreatitis pain," which is now managed, per pt. And 90% of her pain is her migraine. Pt also states she has been d/c home for the same reason in past admissions. D/c orders have been written w/ instructions how to advance diet when tolerable.  Justin Mendaudle, Reeves Musick H, RN

## 2016-07-11 NOTE — Progress Notes (Signed)
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 She woke up suddenly on the day of admission with abdominal pain which was followed by vomiting. She subsequently developed a unilateral migraine and has continued to vomit despite antiemetics.  Subjective: As above  Assessment & Plan:   Active Problems:   Pancreatitis, acute - IVF, pain and nausea management - she states her specialist at [facility] is considering a total pancreatectomy due to her frequent exacerbations  Migraine  - allergic to numerous medications which would typically be given for Migraines- takes Maxalt at home- can bring it in and we can administer it as long as she is not vomiting- will give Mg + IV for now  Dehydration - increase NS IV and give bolus  Fatty liver - chronic  Elevated LFTs - may be related to the pancreatitis?? Improving  IBS- constipation type - Miralax and Dulcolax as needed  Asthma - resume home inhalers  Anxiety - PRN Ativan- does not use it frequently    DVT prophylaxis: SCDs Code Status: Full code Family Communication:  Disposition Plan: home when stable Consultants:   none Procedures:   none Antimicrobials:  Anti-infectives    Start     Dose/Rate Route Frequency Ordered Stop   07/11/16 1400  clindamycin (CLEOCIN) IVPB 900 mg  Status:  Discontinued     900 mg 100 mL/hr over 30 Minutes Intravenous Every 8 hours 07/11/16 0553 07/11/16 1034   07/11/16 1400  aztreonam (AZACTAM) 2 g in dextrose 5 % 50 mL IVPB  Status:  Discontinued     2 g 100 mL/hr over 30 Minutes Intravenous Every 8 hours 07/11/16 0553 07/11/16 1034   07/11/16 0245  aztreonam (AZACTAM) 2 g in dextrose 5 % 50 mL IVPB     2 g 100 mL/hr over 30 Minutes Intravenous  Once  07/11/16 0242 07/11/16 0446   07/11/16 0245  clindamycin (CLEOCIN) IVPB 900 mg     900 mg 100 mL/hr over 30 Minutes Intravenous  Once 07/11/16 0242 07/11/16 0517       Objective: Vitals:   07/10/16 2206 07/11/16 0033 07/11/16 0238 07/11/16 0257  BP: 123/77 126/85 116/72 (!) 144/89  Pulse: 95 99 85 95  Resp: Temp:    98.7 F (37.1 C)  TempSrc:    Oral  SpO2: 99% 99% 95% 98%  Weight:      Height:        Intake/Output Summary (Last 24 hours) at 07/11/16 1244 Last data filed at 07/11/16 1059  Gross per 24 hour  Intake                0 ml  Output              650 ml  Net             -650 ml   Filed Weights   07/10/16 2001  Weight: 73.5 kg (162 lb)    Examination: General exam: Appears comfortable  HEENT: PERRLA, oral mucosa moist, no sclera icterus or thrush Respiratory system: Clear to auscultation. Respiratory effort normal. Cardiovascular system: S1 & S2 heard, RRR.  No murmurs  Gastrointestinal system: Abdomen soft, non-tender, nondistended. Normal bowel sound. No organomegaly Central nervous system: Alert and oriented. No  focal neurological deficits. Extremities: No cyanosis, clubbing or edema Skin: No rashes or ulcers Psychiatry:  Mood & affect appropriate.     Data Reviewed: I have personally reviewed following labs and imaging studies  CBC:  Recent Labs Lab 07/10/16 1844 07/11/16 0604  WBC 18.3* 12.4*  HGB 11.5* 9.4*  HCT 36.5 29.8*  MCV 72.1* 72.7*  PLT 488* 417*   Basic Metabolic Panel:  Recent Labs Lab 07/10/16 1844 07/11/16 0604  NA 135 135  K 3.8 3.6  CL 97* 102  CO2 27 26  GLUCOSE 129* 122*  BUN 16 11  CREATININE 0.64 0.50  CALCIUM 10.5* 8.3*   GFR: Estimated Creatinine Clearance: 91.8 mL/min (by C-G formula based on SCr of 0.8 mg/dL). Liver Function Tests:  Recent Labs Lab 07/10/16 1844 07/11/16 0604  AST 847* 399*  ALT 520* 360*  ALKPHOS 241* 187*  BILITOT 1.4* 1.1  PROT 8.2* 7.0  ALBUMIN 4.4 3.4*     Recent Labs Lab 07/10/16 1844 07/11/16 0604  LIPASE 14 12   No results for input(s): AMMONIA in the last 168 hours. Coagulation Profile:  Recent Labs Lab 07/11/16 0604  INR 1.06   Cardiac Enzymes: No results for input(s): CKTOTAL, CKMB, CKMBINDEX, TROPONINI in the last 168 hours. BNP (last 3 results) No results for input(s): PROBNP in the last 8760 hours. HbA1C: No results for input(s): HGBA1C in the last 72 hours. CBG:  Recent Labs Lab 07/10/16 1844 07/11/16 0623 07/11/16 1159  GLUCAP 156* 119* 123*   Lipid Profile: No results for input(s): CHOL, HDL, LDLCALC, TRIG, CHOLHDL, LDLDIRECT in the last 72 hours. Thyroid Function Tests: No results for input(s): TSH, T4TOTAL, FREET4, T3FREE, THYROIDAB in the last 72 hours. Anemia Panel: No results for input(s): VITAMINB12, FOLATE, FERRITIN, TIBC, IRON, RETICCTPCT in the last 72 hours. Urine analysis:    Component Value Date/Time   COLORURINE YELLOW 07/10/2016 1828   APPEARANCEUR CLEAR 07/10/2016 1828   LABSPEC 1.022 07/10/2016 1828   PHURINE 8.0 07/10/2016 1828   GLUCOSEU NEGATIVE 07/10/2016 1828   HGBUR NEGATIVE 07/10/2016 1828   BILIRUBINUR NEGATIVE 07/10/2016 1828   KETONESUR NEGATIVE 07/10/2016 1828   PROTEINUR NEGATIVE 07/10/2016 1828   UROBILINOGEN 0.2 06/29/2015 0021   NITRITE NEGATIVE 07/10/2016 1828   LEUKOCYTESUR NEGATIVE 07/10/2016 1828   Sepsis Labs: @LABRCNTIP (procalcitonin:4,lacticidven:4) )No results found for this or any previous visit (from the past 240 hour(s)).       Radiology Studies: Ct Abdomen Pelvis W Contrast  Result Date: 07/10/2016 CLINICAL DATA:  Pancreatitis, markedly elevated LFTs. Abdominal pain, nausea, emesis. EXAM: CT ABDOMEN AND PELVIS WITH CONTRAST TECHNIQUE: Multidetector CT imaging of the abdomen and pelvis was performed using the standard protocol following bolus administration of intravenous contrast. CONTRAST:  ISOVUE-300 IOPAMIDOL (ISOVUE-300) INJECTION 61%  COMPARISON:  Most recent comparison 01/05/2016 FINDINGS: Lower chest:  Mild dependent atelectasis.  No pleural effusion. Liver: Decreased density consistent with steatosis. This appears progressed from prior CT. The previous subcapsular hypodensity in segment 6 on prior exam is partially obscured by the adjacent steatosis. Left lobe pneumobilia, suspect prior hepatojejunostomy. Hepatobiliary: Postcholecystectomy.  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 Pancreas: Post partial pancreatectomy. Pancreatic duct measures 3 mm, and decreased from prior. There is no peripancreatic inflammation or fat stranding. No peripancreatic fluid collection. Spleen: Normal. Adrenal glands: No nodule. Kidneys: Symmetric renal enhancement and excretion. No hydronephrosis. Stomach/Bowel: Stomach physiologically distended. There are no dilated or thickened small bowel loops. Moderate volume of stool throughout the colon without colonic wall thickening. Stool distends the  rectum. The appendix is tentatively identified and normal. No pericecal or right lower quadrant inflammation. Vascular/Lymphatic: Prominent mesenteric lymph nodes, likely reactive. No retroperitoneal adenopathy. Abdominal aorta is normal in caliber. Reproductive: Right uterine fibroid measures 3.9 cm. No adnexal mass. Bladder: Physiologically distended without wall thickening. Other: No free air, free fluid, or intra-abdominal fluid collection. Musculoskeletal: There are no acute or suspicious osseous abnormalities. IMPRESSION: 1. Post partial pancreatectomy.  No acute pancreatic inflammation. 2. Progressive hepatic steatosis from prior. Pneumobilia is chronic and unchanged. 3. Moderate stool burden with stool distending the rectum, question constipation. Electronically Signed   By: Rubye OaksMelanie  Ehinger M.D.   On: 07/10/2016 22:03      Scheduled Meds: . diatrizoate meglumine-sodium  15 mL Oral Once  . famotidine (PEPCID) IV  20 mg Intravenous Q12H  . HYDROmorphone      .  insulin glargine  12 Units Subcutaneous BID  . polyethylene glycol  17 g Oral Daily   Continuous Infusions: . sodium chloride 150 mL/hr at 07/11/16 1030     LOS: 0 days    Time spent in minutes: 35    Danyetta Gillham, MD [facility] Pager: [email or web address] Password [email or web address] 07/11/2016, 12:44 PM

## 2016-07-11 NOTE — Plan of Care (Signed)
Problem: Nutrition: Goal: Adequate nutrition will be maintained Outcome: Adequate for Discharge Pt informed to advance diet slowly starting with clear liquids up to a regular diet in 2-3 days per d/c instructions.

## 2016-07-11 NOTE — Progress Notes (Signed)
Pt. Arrived to floor via stretcher pt A&Ox4, VSS c/o pain to mid upper abdomen rating a 9/10 dilaudid  IV given as ordered. Pt had one episode of emesis, zofran was given as ordered. Currently resting in no distress will continue to monitor.

## 2016-07-23 ENCOUNTER — Other Ambulatory Visit: Payer: Self-pay | Admitting: Obstetrics and Gynecology

## 2016-07-23 ENCOUNTER — Other Ambulatory Visit (HOSPITAL_COMMUNITY)
Admission: RE | Admit: 2016-07-23 | Discharge: 2016-07-23 | Disposition: A | Discharge status: Home / Self Care | Payer: BLUE CROSS/BLUE SHIELD | Source: Ambulatory Visit | Attending: Obstetrics and Gynecology | Admitting: Obstetrics and Gynecology

## 2016-07-23 DIAGNOSIS — Z01419 Encounter for gynecological examination (general) (routine) without abnormal findings: Secondary | ICD-10-CM | POA: Insufficient documentation

## 2016-07-23 DIAGNOSIS — Z1151 Encounter for screening for human papillomavirus (HPV): Secondary | ICD-10-CM | POA: Diagnosis present

## 2016-07-23 DIAGNOSIS — R87810 Cervical high risk human papillomavirus (HPV) DNA test positive: Secondary | ICD-10-CM | POA: Insufficient documentation

## 2016-07-25 LAB — CYTOLOGY - PAP

## 2016-10-08 ENCOUNTER — Other Ambulatory Visit: Payer: Self-pay | Admitting: Internal Medicine

## 2016-10-08 ENCOUNTER — Ambulatory Visit
Admission: RE | Admit: 2016-10-08 | Discharge: 2016-10-08 | Disposition: A | Discharge status: Home / Self Care | Payer: BLUE CROSS/BLUE SHIELD | Source: Ambulatory Visit | Attending: Internal Medicine | Admitting: Internal Medicine

## 2016-10-08 DIAGNOSIS — M25562 Pain in left knee: Secondary | ICD-10-CM

## 2016-10-08 DIAGNOSIS — S8992XA Unspecified injury of left lower leg, initial encounter: Secondary | ICD-10-CM

## 2016-10-24 ENCOUNTER — Encounter: Payer: Self-pay | Admitting: *Deleted

## 2016-10-24 DIAGNOSIS — J329 Chronic sinusitis, unspecified: Secondary | ICD-10-CM | POA: Insufficient documentation

## 2016-10-24 DIAGNOSIS — M3500 Sicca syndrome, unspecified: Secondary | ICD-10-CM | POA: Insufficient documentation

## 2016-10-24 DIAGNOSIS — M170 Bilateral primary osteoarthritis of knee: Secondary | ICD-10-CM

## 2016-10-24 DIAGNOSIS — M3509 Sicca syndrome with other organ involvement: Secondary | ICD-10-CM

## 2016-10-24 DIAGNOSIS — G43909 Migraine, unspecified, not intractable, without status migrainosus: Secondary | ICD-10-CM | POA: Insufficient documentation

## 2016-10-24 DIAGNOSIS — M797 Fibromyalgia: Secondary | ICD-10-CM | POA: Insufficient documentation

## 2016-10-24 DIAGNOSIS — M6289 Other specified disorders of muscle: Secondary | ICD-10-CM

## 2016-10-24 DIAGNOSIS — K589 Irritable bowel syndrome without diarrhea: Secondary | ICD-10-CM | POA: Insufficient documentation

## 2016-10-24 DIAGNOSIS — N94810 Vulvar vestibulitis: Secondary | ICD-10-CM

## 2016-10-24 HISTORY — DX: Other specified disorders of muscle: M62.89

## 2016-10-24 HISTORY — DX: Bilateral primary osteoarthritis of knee: M17.0

## 2016-10-25 NOTE — Progress Notes (Signed)
Office Visit Note  Patient: Heather FlingRebekah Shifflet             Date of Birth: Apr 24, 1976           MRN: [id number]             WUJ:[id number]:Griffin, Elaine,M.D. Referring: Maurice SmallGriffin, Elaine, M.D. Visit Date: 10/29/2016 Occupation: Disability    Subjective:   Neck and lower back pain  History of Present Illness: Heather Wagner is a 40 y.o. female with history of fibromyalgia syndrome. She returns today after her last visit in May 2017. She states she has had a lot of problems with IBS and pancreatitis since then. She was hospitalized in August 2017 with a bout of pancreatitis. She also had ERCP procedure during that time she has a follow-up visit regarding the results in January 2018. She's been also experiencing increased pain all over due to increased distress. She states on 10/05/2016 she slipped and a restaurant where there was a portal of water she landed on her left knee joint. She states she developed bruising and increased pain. She had x-ray of her left knee joint which was unremarkable without any fractures. She continues to have discomfort in her left knee joint she's having massage therapy. She's been also having increased pain in her neck and lower back area. She denies any joint swelling.  Activities of Daily Living:  Patient reports morning stiffness for30 minutes.   Patient Reports nocturnal pain.  Difficulty dressing/grooming: Denies Difficulty climbing stairs: Denies Difficulty getting out of chair: Denies Difficulty using hands for taps, buttons, cutlery, and/or writing: Denies   Review of Systems  Constitutional: Positive for fatigue and weakness. Negative for night sweats, weight gain and weight loss.  HENT: Positive for mouth dryness. Negative for mouth sores, trouble swallowing, trouble swallowing and nose dryness.   Eyes: Positive for dryness. Negative for pain, redness and visual disturbance.  Respiratory: Negative for cough, shortness of breath and difficulty breathing.    Cardiovascular: Negative for chest pain, palpitations, hypertension, irregular heartbeat and swelling in legs/feet.  Gastrointestinal: Positive for constipation and diarrhea. Negative for blood in stool.  Endocrine: Negative for increased urination.  Genitourinary: Negative for vaginal dryness.  Musculoskeletal: Positive for arthralgias, joint pain, myalgias, morning stiffness, muscle tenderness and myalgias. Negative for joint swelling and muscle weakness.  Skin: Negative for color change, rash, hair loss, skin tightness, ulcers and sensitivity to sunlight.  Allergic/Immunologic: Negative for susceptible to infections.  Neurological: Negative for dizziness, memory loss and night sweats.  Hematological: Negative for swollen glands.  Psychiatric/Behavioral: Positive for sleep disturbance. Negative for depressed mood. The patient is not nervous/anxious.     PMFS History:  Patient Active Problem List   Diagnosis Date Noted  . Fibromyalgia 10/24/2016  . Osteoarthritis of both knees 10/24/2016  . Sjogren's syndrome (HCC) 10/24/2016  . IBS (irritable bowel syndrome) 10/24/2016  . Migraines 10/24/2016  . Chronic sinusitis 10/24/2016  . Pelvic floor dysfunction 10/24/2016  . Vulvar vestibulitis 10/24/2016  . Dehydration 07/11/2016  . Fatty liver 07/11/2016  . Elevated LFTs 07/11/2016  . 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 Abdominal pain 01/05/2016  . Pancreatitis, acute   . CAP (community acquired pneumonia) 06/19/2014  . Pain management 06/18/2014  . Chronic pancreatitis (HCC) 06/18/2014  . Intractable migraine without aura 01/25/2014  . Acute pancreatitis 09/25/2013  . Leucocytosis 09/25/2013  . Microcytic anemia 09/25/2013  . Asthma 09/25/2013  . Type 2 diabetes mellitus without complication, with long-term current use of insulin (HCC) 09/25/2013  .  Hyponatremia 09/25/2013    Past Medical History:  Diagnosis Date  . Asthma   . Chronic sinusitis   . Diabetes  mellitus   . Fibromyalgia   . Hyperlipidemia   . IBS (irritable bowel syndrome)   . Migraine without aura, with intractable migraine, so stated, without mention of status migrainosus 01/25/2014  . Migraines   . Osteoarthritis of both knees 10/24/2016   moderate  . Pancreatitis chronic   . Pelvic floor dysfunction 10/24/2016  . Sjogren's syndrome (HCC)   . Vulvar vestibulitis     Family History  Problem Relation Age of Onset  . Asthma Mother   . Migraines Sister   . Pancreatitis Paternal Grandfather   . Asthma Brother   . Pancreatic cancer Other   . Pancreatitis Maternal Uncle    Past Surgical History:  Procedure Laterality Date  . BREAST REDUCTION SURGERY  2010  . DILATION AND CURETTAGE OF UTERUS  2007  . ERCP     Removed stone from Bile duct  . ERCP     with MRI  . PANCREAS SURGERY  1997  . whiple  1997  . whipple's     Social History   Social History Narrative  . No narrative on file     Objective: Vital Signs: BP 114/83 (BP Location: Left Arm, Patient Position: Sitting, Cuff Size: Large)   Pulse 87   Resp 12   Ht 5\' 4"  (1.626 m)   Wt 164 lb (74.4 kg)   LMP 10/17/2016   BMI 28.15 kg/m    Physical Exam  Constitutional: She is oriented to person, place, and time. She appears well-developed and well-nourished.  HENT:  Head: Normocephalic and atraumatic.  Eyes: Conjunctivae and EOM are normal.  Neck: Normal range of motion.  Cardiovascular: Normal rate, regular rhythm, normal heart sounds and intact distal pulses.   Pulmonary/Chest: Effort normal and breath sounds normal.  Abdominal: Soft. Bowel sounds are normal.  Lymphadenopathy:    She has no cervical adenopathy.  Neurological: She is alert and oriented to person, place, and time.  Skin: Skin is warm and dry. Capillary refill takes less than 2 seconds.  Psychiatric: She has a normal mood and affect. Her behavior is normal.  Nursing note and vitals reviewed.    Musculoskeletal Exam: C-spine, thoracic  spine , lumbar spine good range of motion. She has spasm over bilateral trapezius area. She is good range of motion of her bilateral shoulders elbows wrist joint MCPs PIPs DIPs. Good range of motion of bilateral hip joints knee joints ankles MTPs PIPs DIPs with no synovitis.  CDAI Exam: No CDAI exam completed.    Investigation: Findings:  May 2015 hepatitis negative, SPEP negative, IgM low at 33, 04/09/2016 CMP glucose 210, calcium 8.5, CBC hemoglobin 9.9, platelets 427, UA negative, 10/08/2016 lipid panel LDL 101 HDL 54, tumor 23rd 2017 CBC normal CMP normal    Imaging: Dg Knee Complete 4 Views Left  Result Date: 10/08/2016 CLINICAL DATA:  Status post fall 3 days ago with persistent anterior knee pain. EXAM: LEFT KNEE - COMPLETE 4+ VIEW COMPARISON:  None in PACs FINDINGS: The bones are subjectively adequately mineralized. There is no acute fracture or dislocation. There is a small suprapatellar effusion. The joint spaces are preserved. There is no significant osteophyte formation. IMPRESSION: Small suprapatellar effusion.  No acute fracture or dislocation. Electronically Signed   By: David  SwazilandJordan M.D.   On: 10/08/2016 15:43    Speciality Comments: No specialty comments available.  Procedures:  No procedures performed Allergies: Nortriptyline hcl; Amitriptyline; Compazine [prochlorperazine maleate]; Cymbalta [duloxetine hcl]; Dexilant [dexlansoprazole]; Doxycycline; Flagyl [metronidazole hcl]; Gabapentin; Penicillins; Penicillins cross reactors; Tizanidine; Topamax [topiramate]; Toradol [ketorolac tromethamine]; Amoxicillin; Inapsine [droperidol]; Ketamine; Levofloxacin; Lyrica [pregabalin]; Phenergan [promethazine hcl]; Phenothiazines; Primaxin [imipenem]; Prozac [fluoxetine hcl]; and Reglan [metoclopramide hcl]   Assessment / Plan:     Visit Diagnoses: Fibromyalgia: Generalized pain with positive tender points and discomfort she's having a flare due to increased stress.  insomnia:  Good sleep hygiene was discussed.  fatigue: It seems to be related to insomnia.  Greater trochanteric bursitis of both hips: IT band exercises were demonstrated in the office today.  Trapezius muscle spasm - Bilateral will avoid  injections she has minimal discomfort at this point.  Primary osteoarthritis of both knees - Bilateral moderate: Weight loss diet and exercise was discussed.  Sjogren's syndrome - Negative ANA, positive SSB: Over-the-counter products were discussed.  Chronic pancreatitis - Hereditary, followed up at [facility]  Type 2 diabetes mellitus without complication, with long-term current use of insulin (HCC)  Intractable migraine without aura and without status migrainosus  Pelvic floor dysfunction    Orders: No orders of the defined types were placed in this encounter.  No orders of the defined types were placed in this encounter.   Face-to-face time spent with patient was 30 minutes. 50% of time was spent in counseling and coordination of care.  Follow-Up Instructions: Return in about 6 months (around 04/28/2017).   Pollyann Savoy, MD

## 2016-10-29 ENCOUNTER — Encounter: Payer: Self-pay | Admitting: Rheumatology

## 2016-10-29 ENCOUNTER — Ambulatory Visit: Payer: BLUE CROSS/BLUE SHIELD | Admitting: Rheumatology

## 2016-10-29 ENCOUNTER — Ambulatory Visit (INDEPENDENT_AMBULATORY_CARE_PROVIDER_SITE_OTHER): Payer: BLUE CROSS/BLUE SHIELD | Admitting: Rheumatology

## 2016-10-29 VITALS — BP 114/83 | HR 87 | Resp 12 | Ht 64.0 in | Wt 164.0 lb

## 2016-10-29 DIAGNOSIS — E119 Type 2 diabetes mellitus without complications: Secondary | ICD-10-CM

## 2016-10-29 DIAGNOSIS — M7062 Trochanteric bursitis, left hip: Secondary | ICD-10-CM

## 2016-10-29 DIAGNOSIS — M62838 Other muscle spasm: Secondary | ICD-10-CM | POA: Diagnosis not present

## 2016-10-29 DIAGNOSIS — R5383 Other fatigue: Secondary | ICD-10-CM

## 2016-10-29 DIAGNOSIS — G43019 Migraine without aura, intractable, without status migrainosus: Secondary | ICD-10-CM

## 2016-10-29 DIAGNOSIS — G4709 Other insomnia: Secondary | ICD-10-CM | POA: Diagnosis not present

## 2016-10-29 DIAGNOSIS — M797 Fibromyalgia: Secondary | ICD-10-CM

## 2016-10-29 DIAGNOSIS — M6289 Other specified disorders of muscle: Secondary | ICD-10-CM | POA: Diagnosis not present

## 2016-10-29 DIAGNOSIS — M3500 Sicca syndrome, unspecified: Secondary | ICD-10-CM

## 2016-10-29 DIAGNOSIS — M7061 Trochanteric bursitis, right hip: Secondary | ICD-10-CM | POA: Diagnosis not present

## 2016-10-29 DIAGNOSIS — K861 Other chronic pancreatitis: Secondary | ICD-10-CM | POA: Diagnosis not present

## 2016-10-29 DIAGNOSIS — M170 Bilateral primary osteoarthritis of knee: Secondary | ICD-10-CM

## 2016-10-29 DIAGNOSIS — Z794 Long term (current) use of insulin: Secondary | ICD-10-CM

## 2016-11-19 ENCOUNTER — Ambulatory Visit (INDEPENDENT_AMBULATORY_CARE_PROVIDER_SITE_OTHER): Payer: BLUE CROSS/BLUE SHIELD | Admitting: Pulmonary Disease

## 2016-11-19 DIAGNOSIS — J4530 Mild persistent asthma, uncomplicated: Secondary | ICD-10-CM | POA: Diagnosis not present

## 2016-11-19 LAB — PULMONARY FUNCTION TEST
DL/VA % pred: 79 %
DL/VA: 3.93 ml/min/mmHg/L
DLCO COR % PRED: 75 %
DLCO cor: 19.31 ml/min/mmHg
DLCO unc % pred: 72 %
DLCO unc: 18.56 ml/min/mmHg
FEF 25-75 POST: 2.53 L/s
FEF 25-75 Pre: 1.73 L/sec
FEF2575-%Change-Post: 46 %
FEF2575-%Pred-Post: 80 %
FEF2575-%Pred-Pre: 54 %
FEV1-%CHANGE-POST: 13 %
FEV1-%Pred-Post: 90 %
FEV1-%Pred-Pre: 79 %
FEV1-POST: 2.81 L
FEV1-Pre: 2.47 L
FEV1FVC-%Change-Post: 8 %
FEV1FVC-%Pred-Pre: 87 %
FEV6-%Change-Post: 4 %
FEV6-%PRED-PRE: 92 %
FEV6-%Pred-Post: 96 %
FEV6-POST: 3.62 L
FEV6-PRE: 3.45 L
FEV6FVC-%Change-Post: 0 %
FEV6FVC-%PRED-POST: 102 %
FEV6FVC-%PRED-PRE: 101 %
FVC-%CHANGE-POST: 4 %
FVC-%PRED-POST: 95 %
FVC-%PRED-PRE: 90 %
FVC-POST: 3.62 L
FVC-PRE: 3.45 L
POST FEV6/FVC RATIO: 100 %
PRE FEV6/FVC RATIO: 100 %
Post FEV1/FVC ratio: 77 %
Pre FEV1/FVC ratio: 71 %
RV % PRED: 80 %
RV: 1.32 L
TLC % PRED: 98 %
TLC: 5.15 L

## 2016-11-21 ENCOUNTER — Ambulatory Visit (INDEPENDENT_AMBULATORY_CARE_PROVIDER_SITE_OTHER): Payer: BLUE CROSS/BLUE SHIELD | Admitting: Adult Health

## 2016-11-21 ENCOUNTER — Encounter: Payer: Self-pay | Admitting: Adult Health

## 2016-11-21 ENCOUNTER — Ambulatory Visit (INDEPENDENT_AMBULATORY_CARE_PROVIDER_SITE_OTHER)
Admission: RE | Admit: 2016-11-21 | Discharge: 2016-11-21 | Disposition: A | Discharge status: Home / Self Care | Payer: BLUE CROSS/BLUE SHIELD | Source: Ambulatory Visit | Attending: Adult Health | Admitting: Adult Health

## 2016-11-21 ENCOUNTER — Other Ambulatory Visit: Payer: BLUE CROSS/BLUE SHIELD

## 2016-11-21 VITALS — BP 128/78 | HR 78 | Ht 64.0 in | Wt 162.0 lb

## 2016-11-21 DIAGNOSIS — J45909 Unspecified asthma, uncomplicated: Secondary | ICD-10-CM

## 2016-11-21 MED ORDER — MOMETASONE FURO-FORMOTEROL FUM 100-5 MCG/ACT IN AERO: 1.0000 | INHALATION_SPRAY | Freq: Two times a day (BID) | RESPIRATORY_TRACT | 0 refills | Status: DC

## 2016-11-21 MED ORDER — MOMETASONE FURO-FORMOTEROL FUM 100-5 MCG/ACT IN AERO: 1.0000 | INHALATION_SPRAY | Freq: Two times a day (BID) | RESPIRATORY_TRACT | 3 refills | Status: DC

## 2016-11-21 NOTE — Assessment & Plan Note (Addendum)
PFT suggest an asthmatic component , not well controlled on QVAR  [facility]   Check allergy profile /IgE  Check cxr   Plan  Patient Instructions  Begin Dulera 1 puff Twice daily  , rinse after use.  Chest xray and labs today  Saline nasal rinses As needed   Follow up Dr. Kendrick FriesMcQuaid in 2-3 months and As needed

## 2016-11-21 NOTE — Addendum Note (Signed)
Addended by: Charlott HollerKIDD, Zahava Quant W on: 11/21/2016 12:24 PM   Modules accepted: Orders

## 2016-11-21 NOTE — Progress Notes (Signed)
@Patient  ID: Heather Wagner, female    DOB: 1976/11/22, 40 y.o.   MRN: [id number]  Chief Complaint  Patient presents with  . Follow-up    Asthma     Referring provider: Shamleffer, Gae Gallop*  HPI: 40 year old female never smoker followed for chronic asthma and allergic rhinitis She has a history of chronic pancreatitis and Sjogren's disease  TEST  Exhaled nitric oxide 01/2016 20    11/21/2016 Follow up ; Asthma  Pt returns for 6 month follow up for Asthma .  She had PFT on 12/18 that showed no significant airflow obstruction -Probable asthma component with BD reversibility . Marland Kitchen  FEV1 90% , ratio 77 , FVC 95% ,++ BD response, DLCO 72% Gets winded easily , says it feels she can not get in good deep breath.  Remains on Qvar 1 puffs Twice daily  . Takes ProAir prior to [employer]  And uses additional Proair 1 more time daily . No sign cough , wheezing . No rash or joint pain .  Disabled from FM , Migraines and chronic Pain and pancreatitis .  No pets , no travel /hobiess .      Allergies  Allergen Reactions  . Nortriptyline Hcl Shortness Of Breath    Swelling anxiety  . Amitriptyline Other (See Comments)    sedation  . Compazine [Prochlorperazine Maleate] Other (See Comments)    Neurological - uncontrollable eye movements  . Cymbalta [Duloxetine Hcl] Nausea And Vomiting    insomnia  . Dexilant [Dexlansoprazole] Nausea Only  . Doxycycline Nausea And Vomiting  . Flagyl [Metronidazole Hcl] Nausea And Vomiting    Skin rash  . Gabapentin Nausea And Vomiting and Other (See Comments)    Agitation  . Penicillins   . Penicillins Cross Reactors Nausea And Vomiting    Skin rash/hives  . Tizanidine Nausea And Vomiting and Other (See Comments)    Agitation  . Topamax [Topiramate] Other (See Comments)    Mood change, anxiety, headaches  . Toradol [Ketorolac Tromethamine] Other (See Comments)    Anxiety.  . Amoxicillin Nausea And Vomiting and Anxiety    Has patient had a PCN  reaction causing immediate rash, facial/tongue/throat swelling, SOB or lightheadedness with hypotension:  Has patient had a PCN reaction causing severe rash involving mucus membranes or skin necrosis:  Has patient had a PCN reaction that required hospitalization  Has patient had a PCN reaction occurring within the last 10 years:  If all of the above answers are "NO", then may proceed with Cephalosporin use.   Trudie Reed [Droperidol] Nausea And Vomiting  . Ketamine Anxiety    Agitation, insomnia   . Levofloxacin Palpitations and Other (See Comments)    Light headed, anxiety, blood rushing, shaking, sweating.l   . Lyrica [Pregabalin] Other (See Comments)    Mood changes- become listless, disinterested  . Phenergan [Promethazine Hcl] Anxiety  . Phenothiazines Hives, Nausea And Vomiting and Rash  . Primaxin [Imipenem] Nausea And Vomiting  . Prozac [Fluoxetine Hcl] Anxiety  . Reglan [Metoclopramide Hcl] Anxiety    Immunization History  Administered Date(s) Administered  . Influenza-Unspecified 09/02/2014    Past Medical History:  Diagnosis Date  . Asthma   . Chronic sinusitis   . Diabetes mellitus   . Fibromyalgia   . Hyperlipidemia   . IBS (irritable bowel syndrome)   . Migraine without aura, with intractable migraine, so stated, without mention of status migrainosus 01/25/2014  . Migraines   . Osteoarthritis of both knees 10/24/2016   moderate  .  Pancreatitis chronic   . Pelvic floor dysfunction 10/24/2016  . Sjogren's syndrome (HCC)   . Vulvar vestibulitis     Tobacco History: History  Smoking Status  . Never Smoker  Smokeless Tobacco  . Never Used   Counseling given: Not Answered   Outpatient Encounter Prescriptions as of 11/21/2016  Medication Sig  . albuterol (PROVENTIL HFA;VENTOLIN HFA) 108 (90 BASE) MCG/ACT inhaler Inhale 1-2 puffs into the lungs every 6 (six) hours as needed for wheezing. Asthma  . BAYER CONTOUR NEXT TEST test strip USE TO CHECK BLOOD SUGAR  4X A DAY (B08.9)  . beclomethasone (QVAR) 80 MCG/ACT inhaler Inhale 1 puff into the lungs 2 (two) times daily.   . calcium carbonate (TUMS EX) 750 MG chewable tablet Chew 2-4 tablets by mouth 2 (two) times daily as needed for heartburn. No more than 10 a day  . carboxymethylcellulose (REFRESH PLUS) 0.5 % SOLN 1-2 drops every 2 (two) hours as needed. Dry eyes  . diclofenac sodium (VOLTAREN) 1 % GEL APPLY 3 GRAMS TO 3 LARGE JOINTS 3 TIMES DAILY AS NEEDED  . HYDROcodone-acetaminophen (NORCO/VICODIN) 5-325 MG per tablet Take 2 tablets by mouth every 6 (six) hours as needed for moderate pain.   Marland Kitchen ibuprofen (ADVIL,MOTRIN) 200 MG tablet Take 400-800 mg by mouth every 8 (eight) hours as needed for moderate pain.   Marland Kitchen insulin aspart (NOVOLOG) 100 UNIT/ML injection Inject 1-5 Units into the skin. 5-6 times daily with meals and extra if needed, injecting 1 unit for every 15 carbs, 1 unit every 50 over 150.  Marland Kitchen LANTUS SOLOSTAR 100 UNIT/ML Solostar Pen Inject 19 Units into the skin 2 (two) times daily.  Marland Kitchen LORazepam (ATIVAN) 1 MG tablet ONE HALF to 1 tab PO BID prn anxiety (Patient taking differently: Take 1 mg by mouth 2 (two) times daily as needed for anxiety. )  . Magnesium 300 MG CAPS Take 1 capsule by mouth every evening.  . Melatonin 5 MG TABS Take 1 tablet by mouth at bedtime.  Marland Kitchen MICROLET LANCETS MISC AS DIRECTED FOUR TIMES A DAY 30 DAYS  . Multiple Vitamins-Calcium (VIACTIV MULTI-VITAMIN) CHEW Chew 2 each by mouth every evening.   . Multiple Vitamins-Minerals (ALIVE WOMENS GUMMY PO) Take 2 tablets by mouth every evening.   Marland Kitchen omeprazole (PRILOSEC) 20 MG capsule Take 20 mg by mouth every morning.   Marland Kitchen OVER THE COUNTER MEDICATION Take 1 Dose by mouth at bedtime as needed (insomnia). Valarian/liqourice root tea  . polyethylene glycol (MIRALAX / GLYCOLAX) packet Take 17 g by mouth daily as needed for mild constipation.  . Probiotic Product (PROBIOTIC DAILY PO) Take 1 capsule by mouth daily as needed (prevent  infection). Reported on 01/16/2016  . rizatriptan (MAXALT) 10 MG tablet Take 10 mg by mouth as needed for migraine. May repeat in 2 hours if needed for headaches  . ZENPEP 25000 units CPEP Take 1-3 capsules by mouth 3 (three) times daily with meals as needed. 3-4 caps per meal and 1-2 caps per snack  . ondansetron (ZOFRAN) 4 MG tablet Take 1 tablet (4 mg total) by mouth every 8 (eight) hours as needed for nausea. As needed (Patient not taking: Reported on 11/21/2016)  . [DISCONTINUED] albuterol (PROVENTIL HFA;VENTOLIN HFA) 108 (90 Base) MCG/ACT inhaler   . [DISCONTINUED] Calcium-Magnesium-Vitamin D (CALCIUM 1200+D3 PO) Take 2 tablets by mouth daily.   . [DISCONTINUED] rizatriptan (MAXALT) 10 MG tablet    No facility-administered encounter medications on file as of 11/21/2016.      Review  of Systems  Constitutional:   No  weight loss, night sweats,  Fevers, chills,  +fatigue, or  lassitude.  HEENT:   No headaches,  Difficulty swallowing,  Tooth/dental problems, or  Sore throat,                No sneezing, itching, ear ache, nasal congestion, post nasal drip,   CV:  No chest pain,  Orthopnea, PND, swelling in lower extremities, anasarca, dizziness, palpitations, syncope.   GI  No heartburn, indigestion, abdominal pain, nausea, vomiting, diarrhea, change in bowel habits, loss of appetite, bloody stools.   Resp:   No chest wall deformity  Skin: no rash or lesions.  GU: no dysuria, change in color of urine, no urgency or frequency.  No flank pain, no hematuria   MS:  Chronic joint pain     Physical Exam  BP 128/78 (BP Location: Left Arm, Cuff Size: Normal)   Pulse 78   Ht 5\' 4"  (1.626 m)   Wt 162 lb (73.5 kg)   LMP 10/17/2016   SpO2 96%   BMI 27.81 kg/m   Vitals:   11/21/16 1123  BP: 128/78  Pulse: 78  SpO2: 96%  Weight: 162 lb (73.5 kg)  Height: 5\' 4"  (1.626 m)    GEN: A/Ox3; pleasant , NAD, well nourished    HEENT:  Maxeys/AT,  EACs-clear, TMs-wnl, NOSE-clear,  THROAT-clear, no lesions, no postnasal drip or exudate noted.   NECK:  Supple w/ fair ROM; no JVD; normal carotid impulses w/o bruits; no thyromegaly or nodules palpated; no lymphadenopathy.    RESP  Clear  P & A; w/o, wheezes/ rales/ or rhonchi. no accessory muscle use, no dullness to percussion  CARD:  RRR, no m/r/g, no peripheral edema, pulses intact, no cyanosis or clubbing.  GI:   Soft & nt; nml bowel sounds; no organomegaly or masses detected.   Musco: Warm bil, no deformities or joint swelling noted.   Neuro: alert, no focal deficits noted.    Skin: Warm, no lesions or rashes  Psych:  No change in mood or affect. No depression or anxiety.  No memory loss.  Lab Results:  CBC    Component Value Date/Time   WBC 12.4 (H) 07/11/2016 0604   RBC 4.10 07/11/2016 0604   HGB 9.4 (L) 07/11/2016 0604   HCT 29.8 (L) 07/11/2016 0604   HCT 28.5 (L) 01/06/2016 0610   PLT 417 (H) 07/11/2016 0604   MCV 72.7 (L) 07/11/2016 0604   MCH 22.9 (L) 07/11/2016 0604   MCHC 31.5 07/11/2016 0604   RDW 16.2 (H) 07/11/2016 0604   LYMPHSABS 2.8 01/16/2016 1127   MONOABS 0.9 01/16/2016 1127   EOSABS 0.2 01/16/2016 1127   BASOSABS 0.0 01/16/2016 1127    BMET    Component Value Date/Time   NA 134 (L) 07/11/2016 1331   NA 133 (A) 04/09/2016   K 3.7 07/11/2016 1331   CL 101 07/11/2016 1331   CO2 26 07/11/2016 1331   GLUCOSE 127 (H) 07/11/2016 1331   BUN 11 07/11/2016 1331   BUN 11 04/09/2016   CREATININE 0.48 07/11/2016 1331   CALCIUM 8.2 (L) 07/11/2016 1331   GFRNONAA >60 07/11/2016 1331   GFRAA >60 07/11/2016 1331    BNP No results found for: BNP  ProBNP No results found for: PROBNP  Imaging: No results found.   Assessment & Plan:   Asthma PFT suggest an asthmatic component , not well controlled on QVAR  [facility]  Check allergy profile /IgE  Check cxr   Plan  Patient Instructions  Begin Dulera 1 puff Twice daily  , rinse after use.  Chest  xray and labs today  Saline nasal rinses As needed   Follow up Dr. Kendrick FriesMcQuaid in 2-3 months and As needed         Rubye Oaksammy Taos Tapp, NP 11/21/2016

## 2016-11-21 NOTE — Addendum Note (Signed)
Addended by: Charlott HollerKIDD, Rishika Mccollom W on: 11/21/2016 12:12 PM   Modules accepted: Orders

## 2016-11-21 NOTE — Patient Instructions (Signed)
Begin Dulera 1 puff Twice daily  , rinse after use.  Chest xray and labs today  Saline nasal rinses As needed   Follow up Dr. Kendrick FriesMcQuaid in 2-3 months and As needed

## 2016-11-22 LAB — RESPIRATORY ALLERGY PROFILE REGION II ~~LOC~~
ALLERGEN, CEDAR TREE, T6: 0.25 kU/L — AB
Allergen, A. alternata, m6: 1.77 kU/L — ABNORMAL HIGH
Allergen, C. Herbarum, M2: 0.16 kU/L — ABNORMAL HIGH
Allergen, D pternoyssinus,d7: 0.15 kU/L — ABNORMAL HIGH
Allergen, Mulberry, t76: <0.1 kU/L
Allergen, Oak,t7: <0.1 kU/L
BERMUDA GRASS: 0.17 kU/L — AB
Cat Dander: 0.12 kU/L — ABNORMAL HIGH
Cockroach: <0.1 kU/L
D. farinae: <0.1 kU/L
Dog Dander: <0.1 kU/L
IGE (IMMUNOGLOBULIN E), SERUM: 39 kU/L (ref <115)
Johnson Grass: 0.13 kU/L — ABNORMAL HIGH
Pecan/Hickory Tree IgE: <0.1 kU/L
Rough Pigweed  IgE: <0.1 kU/L
Timothy Grass: 0.21 kU/L — ABNORMAL HIGH

## 2016-11-23 ENCOUNTER — Telehealth: Payer: Self-pay | Admitting: Adult Health

## 2016-11-23 NOTE — Telephone Encounter (Signed)
Called and spoke with pt and she is aware of results.  She stated that she is not able to take the antihistamines due to drying her out so much.  She spoke with TP about this. Nothing further is needed.

## 2016-11-23 NOTE — Progress Notes (Signed)
LMTCB

## 2016-11-23 NOTE — Telephone Encounter (Signed)
Patient called back to the clinic about results.Charm Rings.Heather Wagner

## 2016-11-29 NOTE — Progress Notes (Signed)
Reviewed, agree 

## 2016-12-07 ENCOUNTER — Ambulatory Visit: Payer: BLUE CROSS/BLUE SHIELD | Admitting: Adult Health

## 2017-02-04 ENCOUNTER — Ambulatory Visit (INDEPENDENT_AMBULATORY_CARE_PROVIDER_SITE_OTHER): Payer: BLUE CROSS/BLUE SHIELD | Admitting: Orthopaedic Surgery

## 2017-02-05 ENCOUNTER — Encounter: Payer: Self-pay | Admitting: Rheumatology

## 2017-03-08 ENCOUNTER — Ambulatory Visit (INDEPENDENT_AMBULATORY_CARE_PROVIDER_SITE_OTHER): Payer: BLUE CROSS/BLUE SHIELD | Admitting: Orthopaedic Surgery

## 2017-03-18 ENCOUNTER — Ambulatory Visit (INDEPENDENT_AMBULATORY_CARE_PROVIDER_SITE_OTHER): Payer: BLUE CROSS/BLUE SHIELD | Admitting: Orthopaedic Surgery

## 2017-03-21 ENCOUNTER — Encounter: Payer: Self-pay | Admitting: Pulmonary Disease

## 2017-03-21 ENCOUNTER — Ambulatory Visit (INDEPENDENT_AMBULATORY_CARE_PROVIDER_SITE_OTHER): Payer: 59 | Admitting: Pulmonary Disease

## 2017-03-21 DIAGNOSIS — J4530 Mild persistent asthma, uncomplicated: Secondary | ICD-10-CM | POA: Diagnosis not present

## 2017-03-21 MED ORDER — BECLOMETHASONE DIPROP HFA 80 MCG/ACT IN AERB: 2.0000 | INHALATION_SPRAY | Freq: Every day | RESPIRATORY_TRACT | 0 refills | Status: DC

## 2017-03-21 NOTE — Progress Notes (Signed)
Subjective:    Patient ID: Heather Wagner, female    DOB: 16-Sep-1976, 41 y.o.   MRN: [id number]  Synopsis: Former patient of Dr. Shelle Iron with asthma.  She also has chronic pancreatitis. Diagnosed with asthma as a child.  Also has allergic rhinitis, Sjogren's diease.   HPI Chief Complaint  Patient presents with  . Follow-up    pt states she is doing well at this time.     She has been breathing well lately. She says taht this time of year is typically good for her, but the summer and winter is typically worse.  She has not had much cough, wheeze or dyspnea.  No cough right not.  She will feel a little tight in her chest from time to time.  She has not been able to exercise a lot either due to the belly issues.  She has ben having a lot of abdominal cramping with food.   She notes an intolerance to [facility] in the past.  She thinks it was "too strong" and made her feel anxious.  She says that she had forgotten a lot of doses of QVar in the weeks leading up to her last visit with Tammy.   She has only needed albuterol 1-2 times since the last visit.    Past Medical History:  Diagnosis Date  . Asthma   . Chronic sinusitis   . Diabetes mellitus   . Fibromyalgia   . Hyperlipidemia   . IBS (irritable bowel syndrome)   . Migraine without aura, with intractable migraine, so stated, without mention of status migrainosus 01/25/2014  . Migraines   . Osteoarthritis of both knees 10/24/2016   moderate  . Pancreatitis chronic   . Pelvic floor dysfunction 10/24/2016  . Sjogren's syndrome (HCC)   . Vulvar vestibulitis        Review of Systems  Constitutional: Positive for fatigue. Negative for chills and fever.  HENT: Positive for postnasal drip, rhinorrhea and sinus pressure.   Respiratory: Positive for chest tightness and shortness of breath. Negative for cough and wheezing.   Cardiovascular: Negative for chest pain, palpitations and leg swelling.       Objective:   Physical  Exam  Vitals:   03/21/17 1626  BP: 114/72  Pulse: 89  Temp: 99 F (37.2 C)  TempSrc: Oral  SpO2: 100%  Weight: 163 lb (73.9 kg)  Height:  (1.626 m)   RA  Gen: well appearing HENT: OP clear, TM's clear, neck supple PULM: CTA B, normal percussion CV: RRR, no mgr, trace edema GI: BS+, soft, nontender Derm: no cyanosis or rash Psyche: normal mood and affect   PFT: December 2017 ratio 77%, FEV1 2.81 L 90% predicted, 13% change post bronchodilator correlating to 330 mL change, FVC 3.62 L 95% predicted, total lung capacity 5.15 L 98% predicted, DLCO 18.56, 72% predicted  Exhaled NO: 01/2016 20 ppm   Labs: 11/2016 RAST > essentially negative 11/2016 IgE> 39  CBC    Component Value Date/Time   WBC 12.4 (H) 07/11/2016 0604   RBC 4.10 07/11/2016 0604   HGB 9.4 (L) 07/11/2016 0604   HCT 29.8 (L) 07/11/2016 0604   HCT 28.5 (L) 01/06/2016 0610   PLT 417 (H) 07/11/2016 0604   MCV 72.7 (L) 07/11/2016 0604   MCH 22.9 (L) 07/11/2016 0604   MCHC 31.5 07/11/2016 0604   RDW 16.2 (H) 07/11/2016 0604   LYMPHSABS 2.8 01/16/2016 1127   MONOABS 0.9 01/16/2016 1127   EOSABS 0.2 01/16/2016  1127   BASOSABS 0.0 01/16/2016 1127    Notes from her visit in December 2017 reviewed were she was treated for worsening asthma symptoms.     Assessment & Plan:  Asthma This has been a stable interval for Heather Wagner. She does well with inhaled Qvar. Unfortunately her insurance company no longer covers it. I have asked that she provide [place] with a copy of her insurance formulary so we can review which inhaled corticosteroids are covered.  Plan: Continue Qvar for now Continue albuterol as needed Follow-up 6 months or sooner    Current Outpatient Prescriptions:  .  albuterol (PROVENTIL HFA;VENTOLIN HFA) 108 (90 BASE) MCG/ACT inhaler, Inhale 1-2 puffs into the lungs every 6 (six) hours as needed for wheezing. Asthma, Disp: , Rfl:  .  BAYER CONTOUR NEXT TEST test strip, USE TO CHECK BLOOD SUGAR  4X A DAY (B08.9), Disp: , Rfl: 5 .  beclomethasone (QVAR) 80 MCG/ACT inhaler, Inhale 1 puff into the lungs 2 (two) times daily. , Disp: , Rfl:  .  calcium carbonate (TUMS EX) 750 MG chewable tablet, Chew 2-4 tablets by mouth 2 (two) times daily as needed for heartburn. No more than 10 a day, Disp: , Rfl:  .  carboxymethylcellulose (REFRESH PLUS) 0.5 % SOLN, 1-2 drops every 2 (two) hours as needed. Dry eyes, Disp: , Rfl:  .  diclofenac sodium (VOLTAREN) 1 % GEL, APPLY 3 GRAMS TO 3 LARGE JOINTS 3 TIMES DAILY AS NEEDED, Disp: , Rfl: 3 .  HYDROcodone-acetaminophen (NORCO/VICODIN) 5-325 MG per tablet, Take 1-2 tablets by mouth every 6 (six) hours as needed for moderate pain. , Disp: , Rfl:  .  ibuprofen (ADVIL,MOTRIN) 200 MG tablet, Take 400-800 mg by mouth every 8 (eight) hours as needed for moderate pain. , Disp: , Rfl:  .  insulin aspart (NOVOLOG) 100 UNIT/ML injection, Inject 1-5 Units into the skin. 5-6 times daily with meals and extra if needed, injecting 1 unit for every 15 carbs, 1 unit every 50 over 150., Disp: , Rfl:  .  LANTUS SOLOSTAR 100 UNIT/ML Solostar Pen, Inject 19 Units into the skin 2 (two) times daily., Disp: , Rfl: 5 .  LORazepam (ATIVAN) 1 MG tablet, ONE HALF to 1 tab PO BID prn anxiety (Patient taking differently: Take 1 mg by mouth 2 (two) times daily as needed for anxiety. ), Disp: 4 tablet, Rfl: 0 .  Magnesium 300 MG CAPS, Take 1 capsule by mouth every evening., Disp: , Rfl:  .  Melatonin 5 MG TABS, Take 1 tablet by mouth at bedtime., Disp: , Rfl:  .  MICROLET LANCETS MISC, AS DIRECTED FOUR TIMES A DAY 30 DAYS, Disp: , Rfl: 2 .  Multiple Vitamins-Calcium (VIACTIV MULTI-VITAMIN) CHEW, Chew 2 each by mouth every evening. , Disp: , Rfl:  .  Multiple Vitamins-Minerals (ALIVE WOMENS GUMMY PO), Take 2 tablets by mouth every evening. , Disp: , Rfl:  .  omeprazole (PRILOSEC) 20 MG capsule, Take 20 mg by mouth every morning. , Disp: , Rfl:  .  OVER THE COUNTER MEDICATION, Take 1 Dose by  mouth at bedtime as needed (insomnia). Valarian/liqourice root tea, Disp: , Rfl:  .  polyethylene glycol (MIRALAX / GLYCOLAX) packet, Take 17 g by mouth daily as needed for mild constipation., Disp: , Rfl:  .  Probiotic Product (PROBIOTIC DAILY PO), Take 1 capsule by mouth daily as needed (prevent infection). Reported on 01/16/2016, Disp: , Rfl:  .  rizatriptan (MAXALT) 10 MG tablet, Take 10 mg by mouth  as needed for migraine. May repeat in 2 hours if needed for headaches, Disp: , Rfl:  .  ZENPEP 25000 units CPEP, Take 1-3 capsules by mouth 3 (three) times daily with meals as needed. 3-4 caps per meal and 1-2 caps per snack, Disp: , Rfl: 11 .  Beclomethasone Diprop HFA (QVAR REDIHALER) 80 MCG/ACT AERB, Inhale 2 puffs into the lungs daily., Disp: 1 Inhaler, Rfl: 0

## 2017-03-21 NOTE — Assessment & Plan Note (Signed)
This has been a stable interval for Heather Wagner. She does well with inhaled Qvar. Unfortunately her insurance company no longer covers it. I have asked that she provide [place] with a copy of her insurance formulary so we can review which inhaled corticosteroids are covered.  Plan: Continue Qvar for now Continue albuterol as needed Follow-up 6 months or sooner

## 2017-03-21 NOTE — Patient Instructions (Signed)
Please let us know which medications are covered by your insurance company and we can suggest a new a inhaled corticosteroid In the meantime continue taking Qvar as you are doing We will see you back in 6 months or sooner if needed

## 2017-04-16 ENCOUNTER — Encounter: Payer: Self-pay | Admitting: Pulmonary Disease

## 2017-04-16 NOTE — Telephone Encounter (Signed)
Morrie SheldonAshley and BQ please confirm if you have received patients medication list via mail. Thanks.

## 2017-04-19 DIAGNOSIS — Z8709 Personal history of other diseases of the respiratory system: Secondary | ICD-10-CM | POA: Insufficient documentation

## 2017-04-19 DIAGNOSIS — R768 Other specified abnormal immunological findings in serum: Secondary | ICD-10-CM | POA: Insufficient documentation

## 2017-04-19 NOTE — Progress Notes (Deleted)
Office Visit Note  Patient: Heather Wagner             Date of Birth: 07/28/1976           MRN: [id number]             PCP: Kelton Pillar, MD Referring: Ronnell Guadalajara* Visit Date: 05/01/2017 Occupation: @GUAROCC @    Subjective:  No chief complaint on file.   History of Present Illness: Heather Wagner is a 41 y.o. female ***   Activities of Daily Living:  Patient reports morning stiffness for *** {minute/hour:19697}.   Patient {ACTIONS;DENIES/REPORTS:21021675::"Denies"} nocturnal pain.  Difficulty dressing/grooming: {ACTIONS;DENIES/REPORTS:21021675::"Denies"} Difficulty climbing stairs: {ACTIONS;DENIES/REPORTS:21021675::"Denies"} Difficulty getting out of chair: {ACTIONS;DENIES/REPORTS:21021675::"Denies"} Difficulty using hands for taps, buttons, cutlery, and/or writing: {ACTIONS;DENIES/REPORTS:21021675::"Denies"}   No Rheumatology ROS completed.   PMFS History:  Patient Active Problem List   Diagnosis Date Noted  . Fibromyalgia 10/24/2016  . Osteoarthritis of both knees 10/24/2016  . Sjogren's syndrome ([place]) 10/24/2016  . IBS (irritable bowel syndrome) 10/24/2016  . Migraines 10/24/2016  . Chronic sinusitis 10/24/2016  . Pelvic floor dysfunction 10/24/2016  . Vulvar vestibulitis 10/24/2016  . Dehydration 07/11/2016  . Fatty liver 07/11/2016  . Elevated LFTs 07/11/2016  . 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 Abdominal pain 01/05/2016  . Pancreatitis, acute   . CAP (community acquired pneumonia) 06/19/2014  . Pain management 06/18/2014  . Chronic pancreatitis ([place]) 06/18/2014  . Intractable migraine without aura 01/25/2014  . Acute pancreatitis 09/25/2013  . Leucocytosis 09/25/2013  . Microcytic anemia 09/25/2013  . Asthma 09/25/2013  . Type 2 diabetes mellitus without complication, with long-term current use of insulin ([place]) 09/25/2013  . Hyponatremia 09/25/2013    Past Medical History:  Diagnosis Date  . Asthma   . Chronic  sinusitis   . Diabetes mellitus   . Fibromyalgia   . Hyperlipidemia   . IBS (irritable bowel syndrome)   . Migraine without aura, with intractable migraine, so stated, without mention of status migrainosus 01/25/2014  . Migraines   . Osteoarthritis of both knees 10/24/2016   moderate  . Pancreatitis chronic   . Pelvic floor dysfunction 10/24/2016  . Sjogren's syndrome ([place])   . Vulvar vestibulitis     Family History  Problem Relation Age of Onset  . Asthma Mother   . Migraines Sister   . Pancreatitis Paternal Grandfather   . Asthma Brother   . Pancreatic cancer Other   . Pancreatitis Maternal Uncle    Past Surgical History:  Procedure Laterality Date  . BREAST REDUCTION SURGERY  2010  . DILATION AND CURETTAGE OF UTERUS  2007  . ERCP     Removed stone from Bile duct  . ERCP     with MRI  . PANCREAS SURGERY  1997  . whiple  1997  . whipple's     Social History   Social History Narrative  . No narrative on file     Objective: Vital Signs: There were no vitals taken for this visit.   Physical Exam   Musculoskeletal Exam: ***  CDAI Exam: No CDAI exam completed.    Investigation: Findings:  :  February 2015:  Her hemoglobin was 10.1, which she believes is due to frequent periods.  Comprehensive metabolic panel and CK were normal.  Sed rate was slightly elevated at 36.  Hemoglobin A1c was 7.3%.  TSH was normal.   04/05/2014 X-rays of bilateral knee joints, 2 views, showed bilateral moderate medial compartment narrowing without any chondrocalcinosis and mild patellofemoral  narrowing.  X-ray of the pelvis showed bilateral SI joints and hip joints within normal limits.  May 2015:  Her sed rate, hep panel were normal.  Immunoglobulins showed IgM was low at 33.  Rheumatoid factor ANA, SPEP, HLA-B27, CCP were negative.  She has positive La antibody, the rest of the ENA was negative.  Vitamin D was 32.         Imaging: No results found.  Speciality Comments: No  specialty comments available.    Procedures:  No procedures performed Allergies: Nortriptyline hcl; Amitriptyline; Compazine [prochlorperazine maleate]; Cymbalta [duloxetine hcl]; Dexilant [dexlansoprazole]; Doxycycline; Flagyl [metronidazole hcl]; Gabapentin; Penicillins; Penicillins cross reactors; Tizanidine; Topamax [topiramate]; Toradol [ketorolac tromethamine]; Amoxicillin; Inapsine [droperidol]; Ketamine; Levofloxacin; Lyrica [pregabalin]; Phenergan [promethazine hcl]; Phenothiazines; Primaxin [imipenem]; Prozac [fluoxetine hcl]; and Reglan [metoclopramide hcl]   Assessment / Plan:     Visit Diagnoses: Fibromyalgia  Primary osteoarthritis of both knees  Sjogren's syndrome with keratoconjunctivitis sicca (HCC)  Elevated LFTs  History of pancreatitis  History of migraine  History of diabetes mellitus  History of asthma    Orders: No orders of the defined types were placed in this encounter.  No orders of the defined types were placed in this encounter.   Face-to-face time spent with patient was *** minutes. 50% of time was spent in counseling and coordination of care.  Follow-Up Instructions: No Follow-up on file.   Clorinda Wyble, RT  Note - This record has been created using [employer].  Chart creation errors have been sought, but may not always  have been located. Such creation errors do not reflect on  the standard of medical care.

## 2017-04-29 ENCOUNTER — Ambulatory Visit: Payer: BLUE CROSS/BLUE SHIELD | Admitting: Rheumatology

## 2017-05-01 ENCOUNTER — Ambulatory Visit: Payer: BLUE CROSS/BLUE SHIELD | Admitting: Rheumatology

## 2017-05-28 NOTE — Progress Notes (Deleted)
Office Visit Note  Patient: Heather Wagner             Date of Birth: 06/11/1976           MRN: [id number]             PCP: Kelton Pillar, MD Referring: Ronnell Guadalajara* Visit Date: 05/31/2017 Occupation: _0 @    Subjective:  No chief complaint on file.   History of Present Illness: Alle Difabio is a 41 y.o. female ***   Activities of Daily Living:  Patient reports morning stiffness for *** {minute/hour:19697}.   Patient {ACTIONS;DENIES/REPORTS:21021675::"Denies"} nocturnal pain.  Difficulty dressing/grooming: {ACTIONS;DENIES/REPORTS:21021675::"Denies"} Difficulty climbing stairs: {ACTIONS;DENIES/REPORTS:21021675::"Denies"} Difficulty getting out of chair: {ACTIONS;DENIES/REPORTS:21021675::"Denies"} Difficulty using hands for taps, buttons, cutlery, and/or writing: {ACTIONS;DENIES/REPORTS:21021675::"Denies"}   No Rheumatology ROS completed.   PMFS History:  Patient Active Problem List   Diagnosis Date Noted  . ANA positive 04/19/2017  . History of chronic sinusitis 04/19/2017  . Fibromyalgia 10/24/2016  . Osteoarthritis of both knees 10/24/2016  . Sjogren's syndrome ([place]) 10/24/2016  . IBS (irritable bowel syndrome) 10/24/2016  . Migraines 10/24/2016  . Chronic sinusitis 10/24/2016  . Pelvic floor dysfunction 10/24/2016  . Vulvar vestibulitis 10/24/2016  . Dehydration 07/11/2016  . Fatty liver 07/11/2016  . Elevated LFTs 07/11/2016  . 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 Abdominal pain 01/05/2016  . Pancreatitis, acute   . CAP (community acquired pneumonia) 06/19/2014  . Pain management 06/18/2014  . Chronic pancreatitis ([place]) 06/18/2014  . Intractable migraine without aura 01/25/2014  . Acute pancreatitis 09/25/2013  . Leucocytosis 09/25/2013  . Microcytic anemia 09/25/2013  . Asthma 09/25/2013  . Type 2 diabetes mellitus without complication, with long-term current use of insulin ([place]) 09/25/2013  . Hyponatremia  09/25/2013    Past Medical History:  Diagnosis Date  . Asthma   . Chronic sinusitis   . Diabetes mellitus   . Fibromyalgia   . Hyperlipidemia   . IBS (irritable bowel syndrome)   . Migraine without aura, with intractable migraine, so stated, without mention of status migrainosus 01/25/2014  . Migraines   . Osteoarthritis of both knees 10/24/2016   moderate  . Pancreatitis chronic   . Pelvic floor dysfunction 10/24/2016  . Sjogren's syndrome ([place])   . Vulvar vestibulitis     Family History  Problem Relation Age of Onset  . Asthma Mother   . Migraines Sister   . Pancreatitis Paternal Grandfather   . Asthma Brother   . Pancreatic cancer Other   . Pancreatitis Maternal Uncle    Past Surgical History:  Procedure Laterality Date  . BREAST REDUCTION SURGERY  2010  . DILATION AND CURETTAGE OF UTERUS  2007  . ERCP     Removed stone from Bile duct  . ERCP     with MRI  . PANCREAS SURGERY  1997  . whiple  1997  . whipple's     Social History   Social History Narrative  . No narrative on file     Objective: Vital Signs: There were no vitals taken for this visit.   Physical Exam   Musculoskeletal Exam: ***  CDAI Exam: No CDAI exam completed.    Investigation: Findings:   Labs from Apr 09, 2016 shows CMP with GFR normal except for low sodium at 133.  Elevated glucose at 210, CBC with diff is normal except for decreased hemoglobin at 9.7, decreased hematocrit at 32.4, MCV is low at 73, MCH is low at 22, MCHC is low at 30.6.  Urine shows 3+ glucose, but the urine culture is negative.  May 2015:  Her sed rate, hep panel were normal.  Immunoglobulins showed IgM was low at 33.  Rheumatoid factor ANA, SPEP, HLA-B27, CCP were negative.  She has positive La antibody, the rest of the ENA was negative.  Vitamin D was 32.          Imaging: No results found.  Speciality Comments: No specialty comments available.    Procedures:  No procedures performed Allergies:  Nortriptyline hcl; Amitriptyline; Compazine [prochlorperazine maleate]; Cymbalta [duloxetine hcl]; Dexilant [dexlansoprazole]; Doxycycline; Flagyl [metronidazole hcl]; Gabapentin; Penicillins; Penicillins cross reactors; Tizanidine; Topamax [topiramate]; Toradol [ketorolac tromethamine]; Amoxicillin; Inapsine [droperidol]; Ketamine; Levofloxacin; Lyrica [pregabalin]; Phenergan [promethazine hcl]; Phenothiazines; Primaxin [imipenem]; Prozac [fluoxetine hcl]; and Reglan [metoclopramide hcl]   Assessment / Plan:     Visit Diagnoses: ANA positive  Elevated LFTs  Fatty liver  Fibromyalgia  Hereditary pancreatitis s/p Whipple  Other elevated white blood cell (WBC) count  Primary osteoarthritis of both knees  Sjogren's syndrome with keratoconjunctivitis sicca (HCC)  History of migraine  History of diabetes mellitus  History of asthma    Orders: No orders of the defined types were placed in this encounter.  No orders of the defined types were placed in this encounter.   Face-to-face time spent with patient was *** minutes. 50% of time was spent in counseling and coordination of care.  Follow-Up Instructions: No Follow-up on file.   Dodie Parisi, RT  Note - This record has been created using [employer].  Chart creation errors have been sought, but may not always  have been located. Such creation errors do not reflect on  the standard of medical care.

## 2017-05-30 DIAGNOSIS — F5101 Primary insomnia: Secondary | ICD-10-CM | POA: Insufficient documentation

## 2017-05-30 DIAGNOSIS — R5383 Other fatigue: Secondary | ICD-10-CM | POA: Insufficient documentation

## 2017-05-31 ENCOUNTER — Ambulatory Visit: Payer: BLUE CROSS/BLUE SHIELD | Admitting: Rheumatology

## 2017-06-26 IMAGING — DX DG CHEST 2V
2 series · 2 of 2 positions shown · non-contrast
Comparison: 01/16/2016

CLINICAL DATA: Chest tightness

EXAM:
CHEST  2 VIEW

[chest pa]
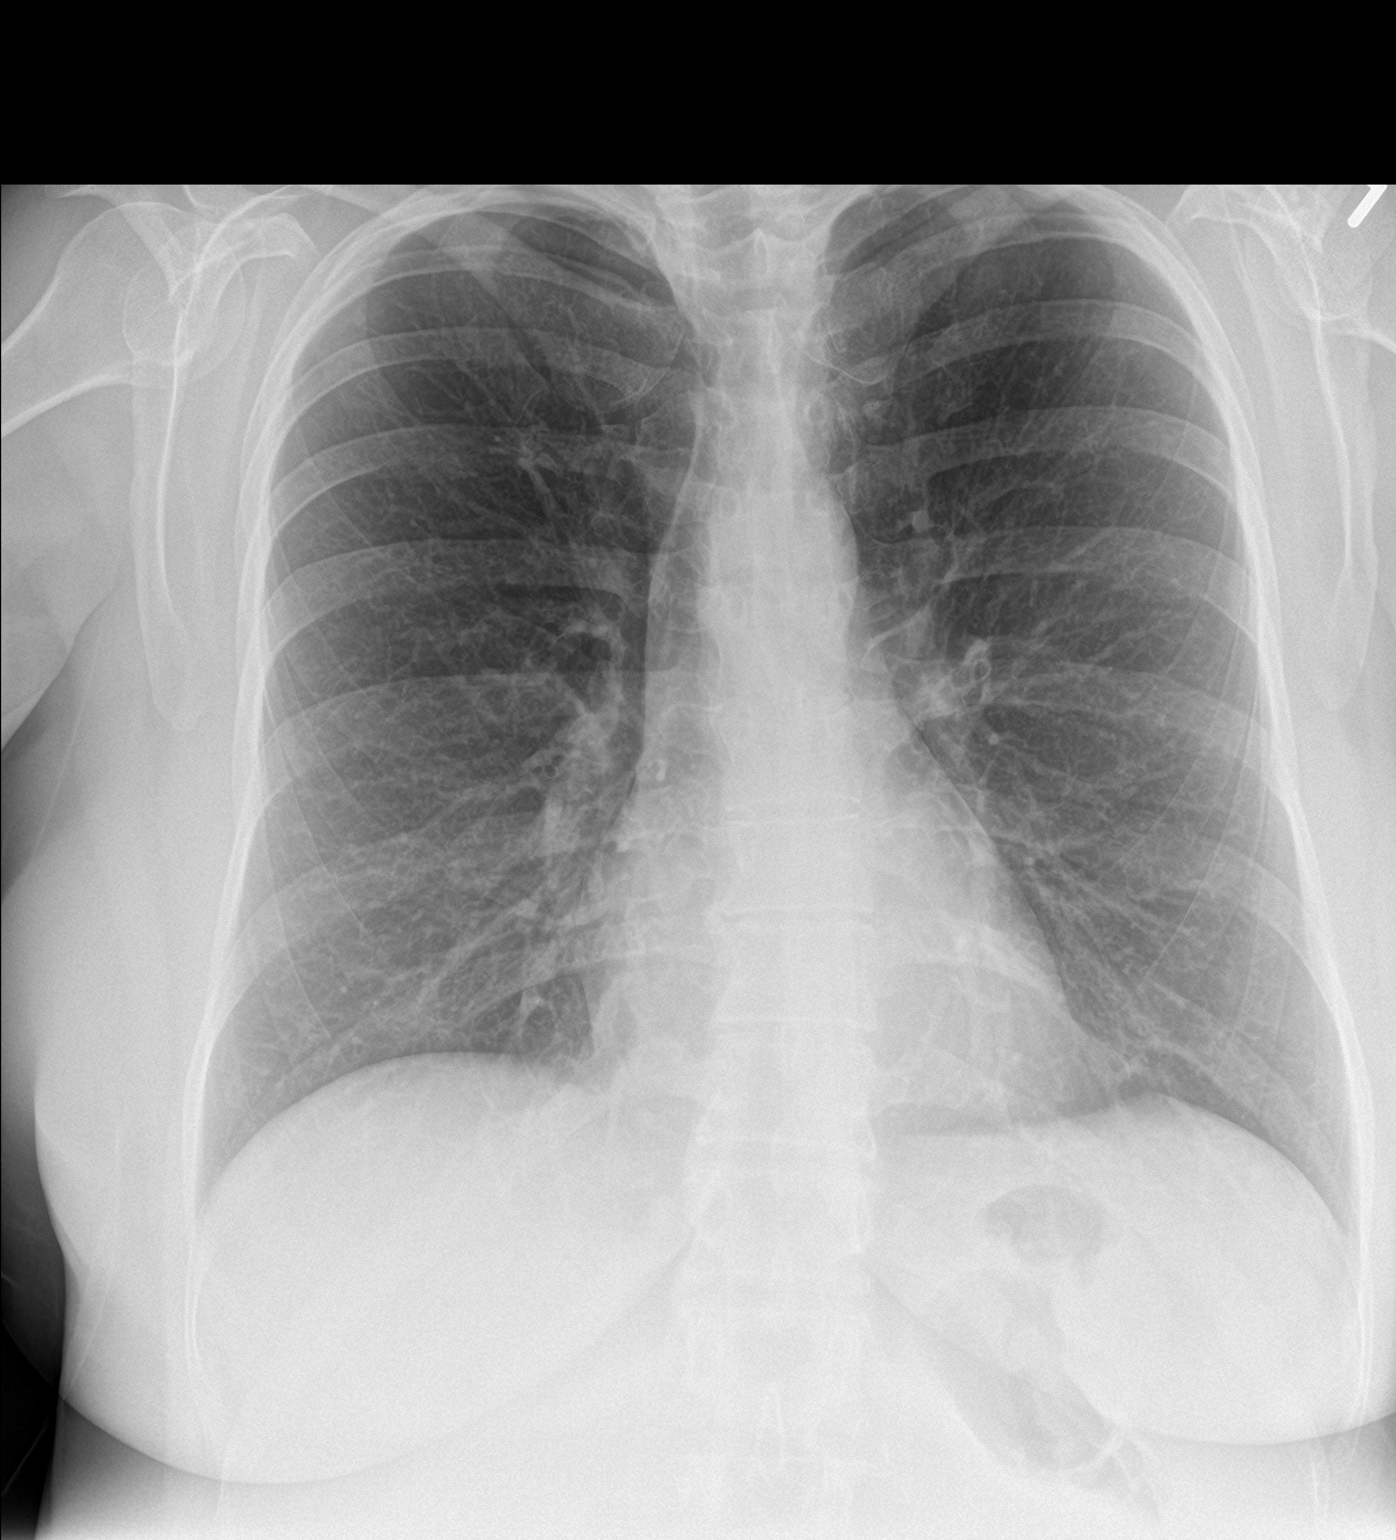

[chest lat]
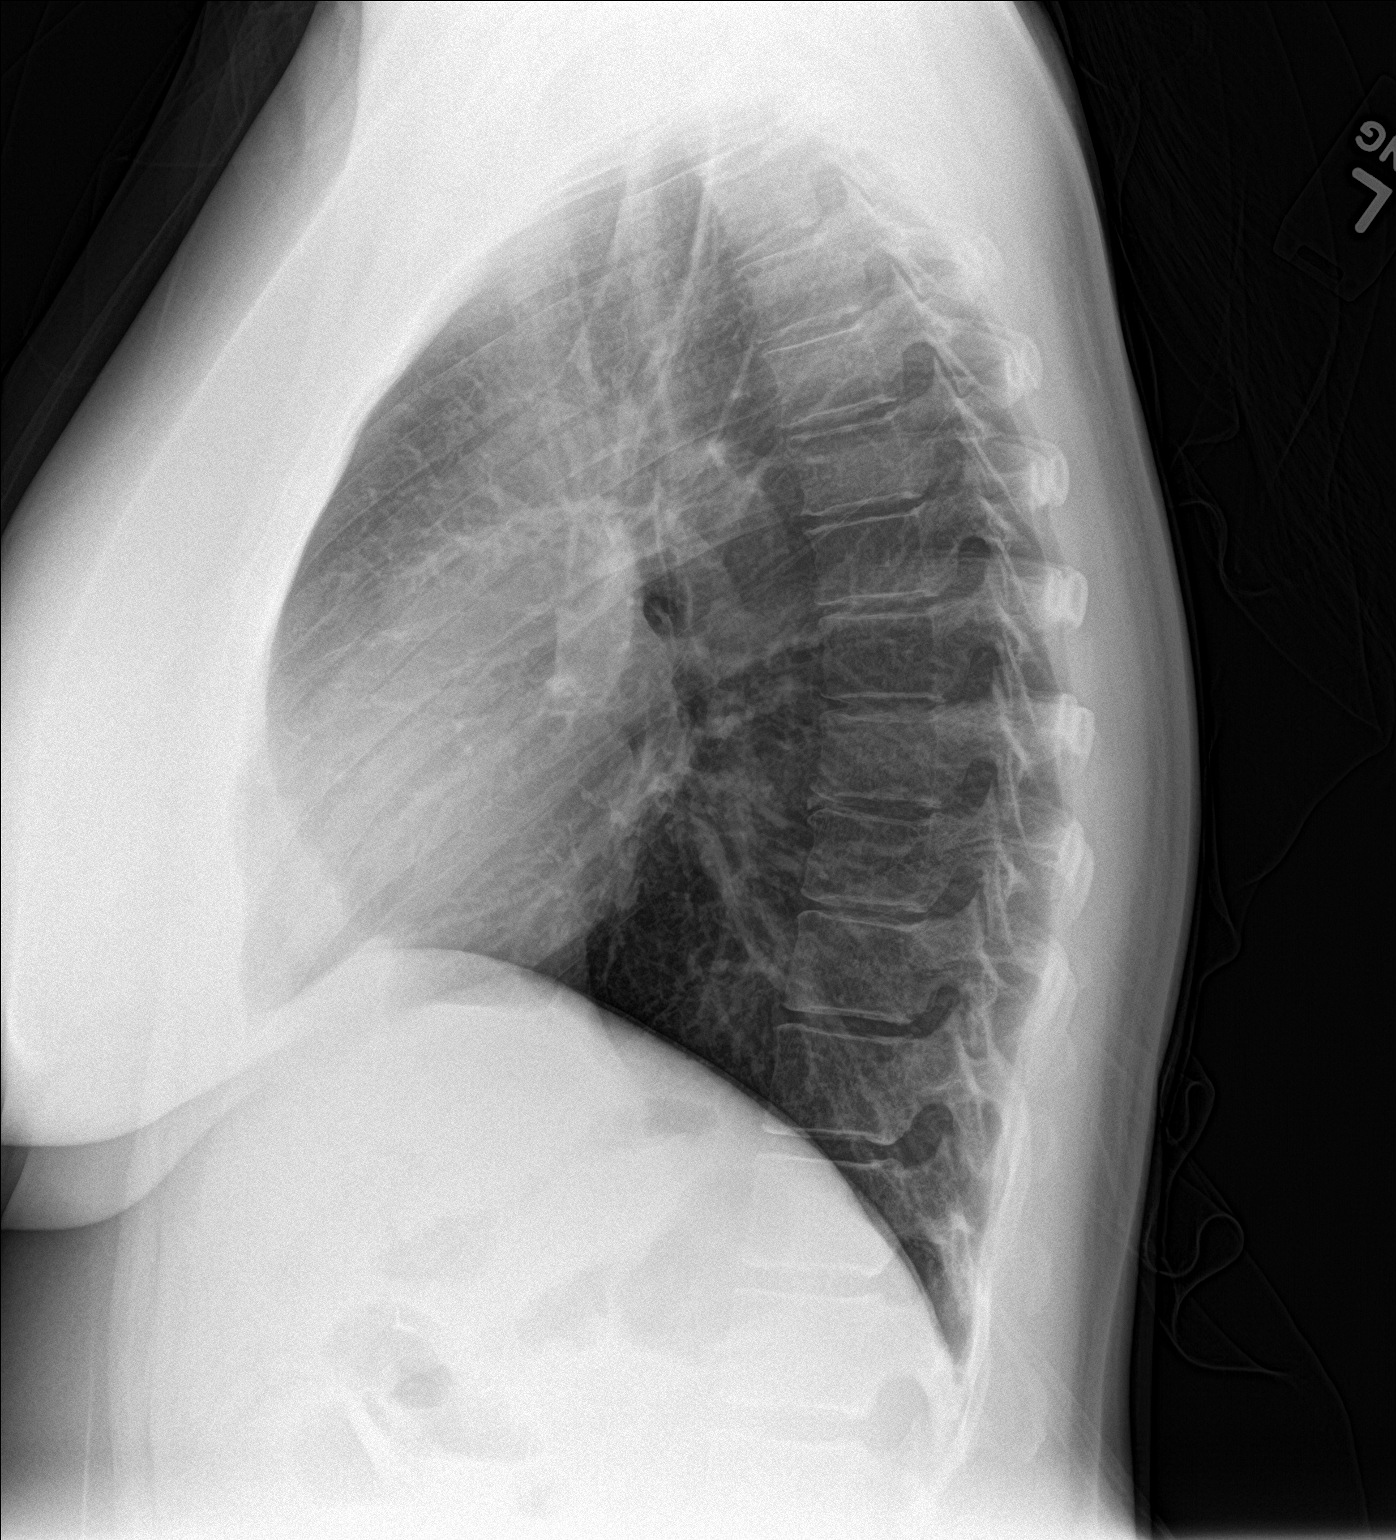

[2 of 2 positions shown; findings below may reference images not displayed]

FINDINGS: Normal heart size and mediastinal contours. No acute infiltrate or
edema. No effusion or pneumothorax. No acute osseous findings.
IMPRESSION: Negative chest.

## 2017-07-12 DIAGNOSIS — M5030 Other cervical disc degeneration, unspecified cervical region: Secondary | ICD-10-CM | POA: Insufficient documentation

## 2017-07-12 DIAGNOSIS — M5136 Other intervertebral disc degeneration, lumbar region: Secondary | ICD-10-CM | POA: Insufficient documentation

## 2017-07-12 NOTE — Progress Notes (Deleted)
Office Visit Note  Patient: Heather FlingRebekah Wagner             Date of Birth: 1976/01/04           MRN: [id number]             PCP: Maurice SmallGriffin, Elaine, MD Referring: Heather Wagner, Heather Wagner* Visit Date: 07/19/2017 Occupation: @GUAROCC @    Subjective:  No chief complaint on file.   History of Present Illness: Heather Wagner is a 41 y.o. female ***   Activities of Daily Living:  Patient reports morning stiffness for *** {minute/hour:19697}.   Patient {ACTIONS;DENIES/REPORTS:21021675::"Denies"} nocturnal pain.  Difficulty dressing/grooming: {ACTIONS;DENIES/REPORTS:21021675::"Denies"} Difficulty climbing stairs: {ACTIONS;DENIES/REPORTS:21021675::"Denies"} Difficulty getting out of chair: {ACTIONS;DENIES/REPORTS:21021675::"Denies"} Difficulty using hands for taps, buttons, cutlery, and/or writing: {ACTIONS;DENIES/REPORTS:21021675::"Denies"}   No Rheumatology ROS completed.   PMFS History:  Patient Active Problem List   Diagnosis Date Noted  . DDD (degenerative disc disease), cervical 07/12/2017  . DDD (degenerative disc disease), lumbar 07/12/2017  . Primary insomnia 05/30/2017  . Other fatigue 05/30/2017  . ANA positive 04/19/2017  . History of chronic sinusitis 04/19/2017  . Fibromyalgia 10/24/2016  . Osteoarthritis of both knees 10/24/2016  . Sjogren's syndrome (HCC) 10/24/2016  . IBS (irritable bowel syndrome) 10/24/2016  . Migraines 10/24/2016  . Chronic sinusitis 10/24/2016  . Pelvic floor dysfunction 10/24/2016  . Vulvar vestibulitis 10/24/2016  . Dehydration 07/11/2016  . Fatty liver 07/11/2016  . Elevated LFTs 07/11/2016  . 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 Abdominal pain 01/05/2016  . Pancreatitis, acute   . CAP (community acquired pneumonia) 06/19/2014  . Pain management 06/18/2014  . Chronic pancreatitis (HCC) 06/18/2014  . Intractable migraine without aura 01/25/2014  . Acute pancreatitis 09/25/2013  . Leucocytosis 09/25/2013  .  Microcytic anemia 09/25/2013  . Asthma 09/25/2013  . Type 2 diabetes mellitus without complication, with long-term current use of insulin (HCC) 09/25/2013  . Hyponatremia 09/25/2013    Past Medical History:  Diagnosis Date  . Asthma   . Chronic sinusitis   . Diabetes mellitus   . Fibromyalgia   . Hyperlipidemia   . IBS (irritable bowel syndrome)   . Migraine without aura, with intractable migraine, so stated, without mention of status migrainosus 01/25/2014  . Migraines   . Osteoarthritis of both knees 10/24/2016   moderate  . Pancreatitis chronic   . Pelvic floor dysfunction 10/24/2016  . Sjogren's syndrome (HCC)   . Vulvar vestibulitis     Family History  Problem Relation Age of Onset  . Asthma Mother   . Migraines Sister   . Pancreatitis Paternal Grandfather   . Asthma Brother   . Pancreatic cancer Other   . Pancreatitis Maternal Uncle    Past Surgical History:  Procedure Laterality Date  . BREAST REDUCTION SURGERY  2010  . DILATION AND CURETTAGE OF UTERUS  2007  . ERCP     Removed stone from Bile duct  . ERCP     with MRI  . PANCREAS SURGERY  1997  . whiple  1997  . whipple's     Social History   Social History Narrative  . No narrative on file     Objective: Vital Signs: There were no vitals taken for this visit.   Physical Exam   Musculoskeletal Exam: ***  CDAI Exam: No CDAI exam completed.    Investigation: No additional findings.  Imaging: No results found.  Speciality Comments: No specialty comments available.    Procedures:  No procedures performed Allergies: Nortriptyline hcl; Amitriptyline; Compazine [  prochlorperazine maleate]; Cymbalta [duloxetine hcl]; Dexilant [dexlansoprazole]; Doxycycline; Flagyl [metronidazole hcl]; Gabapentin; Penicillins; Penicillins cross reactors; Tizanidine; Topamax [topiramate]; Toradol [ketorolac tromethamine]; Amoxicillin; Inapsine [droperidol]; Ketamine; Levofloxacin; Lyrica [pregabalin]; Phenergan  [promethazine hcl]; Phenothiazines; Primaxin [imipenem]; Prozac [fluoxetine hcl]; and Reglan [metoclopramide hcl]   Assessment / Plan:     Visit Diagnoses: ANA positive  Sjogren's syndrome with keratoconjunctivitis sicca (HCC)  Fibromyalgia  Other fatigue  Primary insomnia  DDD (degenerative disc disease), cervical  DDD (degenerative disc disease), lumbar  Primary osteoarthritis of both knees  Pelvic floor dysfunction  Fatty liver  Elevated LFTs  History of IBS  History of chronic sinusitis  History of migraine  History of pancreatitis - Hereditary/ followed at [facility] s/p Whipple procedure     Orders: No orders of the defined types were placed in this encounter.  No orders of the defined types were placed in this encounter.   Face-to-face time spent with patient was *** minutes. 50% of time was spent in counseling and coordination of care.  Follow-Up Instructions: No Follow-up on file.   Melana Hingle, RT  Note - This record has been created using [employer].  Chart creation errors have been sought, but may not always  have been located. Such creation errors do not reflect on  the standard of medical care.

## 2017-07-19 ENCOUNTER — Ambulatory Visit: Payer: BLUE CROSS/BLUE SHIELD | Admitting: Rheumatology

## 2017-07-26 ENCOUNTER — Ambulatory Visit: Payer: BLUE CROSS/BLUE SHIELD | Admitting: Rheumatology

## 2017-09-12 ENCOUNTER — Other Ambulatory Visit (HOSPITAL_COMMUNITY)
Admission: RE | Admit: 2017-09-12 | Discharge: 2017-09-12 | Disposition: A | Discharge status: Home / Self Care | Payer: Managed Care, Other (non HMO) | Source: Ambulatory Visit | Attending: Obstetrics and Gynecology | Admitting: Obstetrics and Gynecology

## 2017-09-12 ENCOUNTER — Other Ambulatory Visit: Payer: Self-pay | Admitting: Obstetrics and Gynecology

## 2017-09-12 DIAGNOSIS — Z01411 Encounter for gynecological examination (general) (routine) with abnormal findings: Secondary | ICD-10-CM | POA: Diagnosis not present

## 2017-09-13 LAB — CYTOLOGY - PAP
DIAGNOSIS: NEGATIVE
HPV (WINDOPATH): NOT DETECTED

## 2017-10-27 ENCOUNTER — Encounter (HOSPITAL_COMMUNITY): Payer: Self-pay

## 2017-10-27 ENCOUNTER — Inpatient Hospital Stay (HOSPITAL_COMMUNITY)
Admission: EM | Admit: 2017-10-27 | Discharge: 2017-10-30 | DRG: 439 | Disposition: A | Discharge status: Home / Self Care | Payer: Managed Care, Other (non HMO) | Attending: Nephrology | Admitting: Nephrology

## 2017-10-27 DIAGNOSIS — K861 Other chronic pancreatitis: Secondary | ICD-10-CM | POA: Diagnosis present

## 2017-10-27 DIAGNOSIS — D509 Iron deficiency anemia, unspecified: Secondary | ICD-10-CM | POA: Diagnosis present

## 2017-10-27 DIAGNOSIS — Z90411 Acquired partial absence of pancreas: Secondary | ICD-10-CM

## 2017-10-27 DIAGNOSIS — E785 Hyperlipidemia, unspecified: Secondary | ICD-10-CM | POA: Diagnosis present

## 2017-10-27 DIAGNOSIS — Z880 Allergy status to penicillin: Secondary | ICD-10-CM

## 2017-10-27 DIAGNOSIS — Z79899 Other long term (current) drug therapy: Secondary | ICD-10-CM

## 2017-10-27 DIAGNOSIS — R7989 Other specified abnormal findings of blood chemistry: Secondary | ICD-10-CM | POA: Diagnosis present

## 2017-10-27 DIAGNOSIS — D72829 Elevated white blood cell count, unspecified: Secondary | ICD-10-CM | POA: Diagnosis present

## 2017-10-27 DIAGNOSIS — J45909 Unspecified asthma, uncomplicated: Secondary | ICD-10-CM | POA: Diagnosis present

## 2017-10-27 DIAGNOSIS — Z888 Allergy status to other drugs, medicaments and biological substances status: Secondary | ICD-10-CM

## 2017-10-27 DIAGNOSIS — M797 Fibromyalgia: Secondary | ICD-10-CM | POA: Diagnosis present

## 2017-10-27 DIAGNOSIS — Z881 Allergy status to other antibiotic agents status: Secondary | ICD-10-CM

## 2017-10-27 DIAGNOSIS — E119 Type 2 diabetes mellitus without complications: Secondary | ICD-10-CM | POA: Diagnosis present

## 2017-10-27 DIAGNOSIS — E871 Hypo-osmolality and hyponatremia: Secondary | ICD-10-CM | POA: Diagnosis present

## 2017-10-27 DIAGNOSIS — R1013 Epigastric pain: Secondary | ICD-10-CM | POA: Diagnosis not present

## 2017-10-27 DIAGNOSIS — K859 Acute pancreatitis without necrosis or infection, unspecified: Principal | ICD-10-CM | POA: Diagnosis present

## 2017-10-27 DIAGNOSIS — Z794 Long term (current) use of insulin: Secondary | ICD-10-CM

## 2017-10-27 DIAGNOSIS — R52 Pain, unspecified: Secondary | ICD-10-CM

## 2017-10-27 LAB — CBC WITH DIFFERENTIAL/PLATELET
Basophils Absolute: 0 10*3/uL (ref 0.0–0.1)
Basophils Relative: 0 %
EOS ABS: 0.2 10*3/uL (ref 0.0–0.7)
EOS PCT: 1 %
HCT: 31.4 % — ABNORMAL LOW (ref 36.0–46.0)
Hemoglobin: 9.6 g/dL — ABNORMAL LOW (ref 12.0–15.0)
LYMPHS ABS: 2.8 10*3/uL (ref 0.7–4.0)
Lymphocytes Relative: 14 %
MCH: 21.1 pg — AB (ref 26.0–34.0)
MCHC: 30.6 g/dL (ref 30.0–36.0)
MCV: 69.2 fL — ABNORMAL LOW (ref 78.0–100.0)
MONOS PCT: 7 %
Monocytes Absolute: 1.4 10*3/uL — ABNORMAL HIGH (ref 0.1–1.0)
Neutro Abs: 15.6 10*3/uL — ABNORMAL HIGH (ref 1.7–7.7)
Neutrophils Relative %: 78 %
PLATELETS: 449 10*3/uL — AB (ref 150–400)
RBC: 4.54 MIL/uL (ref 3.87–5.11)
RDW: 17 % — ABNORMAL HIGH (ref 11.5–15.5)
WBC: 20 10*3/uL — ABNORMAL HIGH (ref 4.0–10.5)

## 2017-10-27 LAB — COMPREHENSIVE METABOLIC PANEL
ALK PHOS: 81 U/L (ref 38–126)
ALT: 21 U/L (ref 14–54)
ANION GAP: 10 (ref 5–15)
AST: 21 U/L (ref 15–41)
Albumin: 3.8 g/dL (ref 3.5–5.0)
BUN: 12 mg/dL (ref 6–20)
CHLORIDE: 99 mmol/L — AB (ref 101–111)
CO2: 25 mmol/L (ref 22–32)
Calcium: 8.9 mg/dL (ref 8.9–10.3)
Creatinine, Ser: 0.5 mg/dL (ref 0.44–1.00)
GFR calc Af Amer: >60 mL/min (ref >60)
Glucose, Bld: 165 mg/dL — ABNORMAL HIGH (ref 65–99)
Potassium: 3.5 mmol/L (ref 3.5–5.1)
SODIUM: 134 mmol/L — AB (ref 135–145)
Total Bilirubin: 0.5 mg/dL (ref 0.3–1.2)
Total Protein: 7.5 g/dL (ref 6.5–8.1)

## 2017-10-27 LAB — URINALYSIS, ROUTINE W REFLEX MICROSCOPIC
BILIRUBIN URINE: NEGATIVE
Glucose, UA: NEGATIVE mg/dL
Hgb urine dipstick: NEGATIVE
Ketones, ur: NEGATIVE mg/dL
Leukocytes, UA: NEGATIVE
NITRITE: NEGATIVE
PROTEIN: NEGATIVE mg/dL
SPECIFIC GRAVITY, URINE: 1.025 (ref 1.005–1.030)
pH: 7 (ref 5.0–8.0)

## 2017-10-27 LAB — LIPASE, BLOOD: LIPASE: 34 U/L (ref 11–51)

## 2017-10-27 MED ORDER — HYDROMORPHONE HCL 1 MG/ML IJ SOLN
1.0000 mg | Freq: Once | INTRAMUSCULAR | Status: AC
  Administered 2017-10-27: 1 mg via INTRAVENOUS
  Filled 2017-10-27: qty 1

## 2017-10-27 MED ORDER — FAMOTIDINE IN NACL 20-0.9 MG/50ML-% IV SOLN
20.0000 mg | Freq: Once | INTRAVENOUS | Status: AC
  Administered 2017-10-27: 20 mg via INTRAVENOUS
  Filled 2017-10-27: qty 50

## 2017-10-27 MED ORDER — HYDROMORPHONE HCL 2 MG/ML IJ SOLN
2.0000 mg | Freq: Once | INTRAMUSCULAR | Status: AC
  Administered 2017-10-27: 2 mg via INTRAVENOUS
  Filled 2017-10-27: qty 1

## 2017-10-27 MED ORDER — SODIUM CHLORIDE 0.9 % IV SOLN
1000.0000 mL | Freq: Once | INTRAVENOUS | Status: AC
  Administered 2017-10-27: 1000 mL via INTRAVENOUS

## 2017-10-27 NOTE — ED Provider Notes (Signed)
[facility]-EMERGENCY DEPT Provider Note   CSN: [id number] Arrival date & time: 10/27/17  2006     History   Chief Complaint Chief Complaint  Patient presents with  . Pancreatitis    HPI Heather Wagner is a 41 y.o. female.  Pt presents to the ED with sx c/w pancreatitis.  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  Sx started when she was 8.  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  She has exocrine and endocrine dysfunction.  She is followed at GI in [place].  The pt said she had minimal sx earlier in the week, but today, the pain became very severe.  The pt does not have vomiting and only mild nausea.           Past Medical History:  Diagnosis Date  . Asthma   . Chronic sinusitis   . Diabetes mellitus   . Fibromyalgia   . Hyperlipidemia   . IBS (irritable bowel syndrome)   . Migraine without aura, with intractable migraine, so stated, without mention of status migrainosus 01/25/2014  . Migraines   . Osteoarthritis of both knees 10/24/2016   moderate  . Pancreatitis chronic   . Pelvic floor dysfunction 10/24/2016  . Sjogren's syndrome (HCC)   . Vulvar vestibulitis     Patient Active Problem List   Diagnosis Date Noted  . DDD (degenerative disc disease), cervical 07/12/2017  . DDD (degenerative disc disease), lumbar 07/12/2017  . Primary insomnia 05/30/2017  . Other fatigue 05/30/2017  . ANA positive 04/19/2017  . History of chronic sinusitis 04/19/2017  . Fibromyalgia 10/24/2016  . Osteoarthritis of both knees 10/24/2016  . Sjogren's syndrome (HCC) 10/24/2016  . IBS (irritable bowel syndrome) 10/24/2016  . Migraines 10/24/2016  . Chronic sinusitis 10/24/2016  . Pelvic floor dysfunction 10/24/2016  . Vulvar vestibulitis 10/24/2016  . Dehydration 07/11/2016  . Fatty liver 07/11/2016  . Elevated LFTs 07/11/2016  . 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  . Abdominal pain 01/05/2016  . Pancreatitis, acute   . CAP (community acquired pneumonia) 06/19/2014  . Pain management 06/18/2014  . Chronic pancreatitis (HCC) 06/18/2014  . Intractable migraine without aura 01/25/2014  . Acute pancreatitis 09/25/2013  . Leucocytosis 09/25/2013  . Microcytic anemia 09/25/2013  . Asthma 09/25/2013  . Type 2 diabetes mellitus without complication, with long-term current use of insulin (HCC) 09/25/2013  . Hyponatremia 09/25/2013    Past Surgical History:  Procedure Laterality Date  . BREAST REDUCTION SURGERY  2010  . DILATION AND CURETTAGE OF UTERUS  2007  . ERCP     Removed stone from Bile duct  . ERCP     with MRI  . PANCREAS SURGERY  1997  . whiple  1997  . whipple's      OB History    No data available       Home Medications    Prior to Admission medications   Medication Sig Start Date End Date Taking? Authorizing Provider  albuterol (PROVENTIL HFA;VENTOLIN HFA) 108 (90 BASE) MCG/ACT inhaler Inhale 1-2 puffs into the lungs every 6 (six) hours as needed for wheezing. Asthma   Yes [provider]  BAYER CONTOUR NEXT TEST test strip USE TO CHECK BLOOD SUGAR 4X A DAY (B08.9) 09/27/16  Yes [provider]  Beclomethasone Diprop HFA (QVAR REDIHALER) 80 MCG/ACT AERB Inhale 2 puffs into the lungs daily. 03/21/17  Yes McQuaid,  Brooke Paceouglas B, MD  calcium carbonate (TUMS EX) 750 MG chewable tablet Chew 2-4 tablets by mouth 2 (two) times daily as needed for heartburn. No more than 10 a day   Yes [provider]  carboxymethylcellulose (REFRESH PLUS) 0.5 % SOLN 1-2 drops every 2 (two) hours as needed for Dry eyes   Yes [provider]  diclofenac sodium (VOLTAREN) 1 % GEL APPLY 3 GRAMS TO 3 LARGE JOINTS 3 TIMES DAILY AS NEEDED FOR PAIN 09/27/16  Yes [provider]  HYDROcodone-acetaminophen (NORCO/VICODIN) 5-325 MG per tablet Take 1-2 tablets by mouth every 6 (six) hours as needed for  moderate pain.    Yes [provider]  ibuprofen (ADVIL,MOTRIN) 200 MG tablet Take 400-800 mg by mouth every 8 (eight) hours as needed for moderate pain.    Yes [provider]  insulin aspart (NOVOLOG) 100 UNIT/ML injection Inject 1-5 Units into the skin. 5-6 times daily with meals and extra if needed, injecting 1 unit for every 15 carbs, 1 unit every 50 over 150.   Yes [provider]  Insulin Glargine (BASAGLAR KWIKPEN) 100 UNIT/ML SOPN Inject 21 Units into the skin 2 (two) times daily.   Yes [provider]  Magnesium 300 MG CAPS Take 300 mg by mouth every evening.    Yes [provider]  Melatonin 5 MG TABS Take 1 tablet by mouth at bedtime.   Yes [provider]  MICROLET LANCETS MISC AS DIRECTED FOUR TIMES A DAY 30 DAYS 08/05/16  Yes [provider]  Multiple Vitamins-Minerals (ALIVE WOMENS GUMMY PO) Take 2 tablets by mouth daily after breakfast.    Yes [provider]  omeprazole (PRILOSEC) 20 MG capsule Take 20 mg by mouth every morning.    Yes [provider]  OVER THE COUNTER MEDICATION Take 1 Dose by mouth at bedtime as needed (insomnia). Valarian/liqourice root tea   Yes [provider]  polyethylene glycol (MIRALAX / GLYCOLAX) packet Take 17 g by mouth daily as needed for mild constipation.   Yes [provider]  Probiotic Product (PROBIOTIC DAILY PO) Take 1 capsule by mouth daily. Reported on 01/16/2016   Yes [provider]  rizatriptan (MAXALT) 10 MG tablet Take 10 mg by mouth as needed for migraine. May repeat in 2 hours if needed for headaches   Yes [provider]  ZENPEP 25000 units CPEP Take 1-3 capsules by mouth 3 (three) times daily with meals as needed. 3-4 caps per meal and 1-2 caps per snack 12/03/15  Yes [provider]  LORazepam (ATIVAN) 1 MG tablet ONE HALF to 1 tab PO BID prn anxiety Patient not taking: Reported on 10/27/2017 06/22/14   Samuel JesterMcManus,  Kathleen, DO    Family History Family History  Problem Relation Age of Onset  . Asthma Mother   . Migraines Sister   . Pancreatitis Paternal Grandfather   . Asthma Brother   . Pancreatic cancer Other   . Pancreatitis Maternal Uncle     Social History Social History   Tobacco Use  . Smoking status: Never Smoker  . Smokeless tobacco: Never Used  Substance Use Topics  . Alcohol use: No    Alcohol/week: 0.0 oz  . Drug use: No     Allergies   Ciprofloxacin hcl; Nortriptyline hcl; Amitriptyline; Compazine [prochlorperazine maleate]; Cymbalta [duloxetine hcl]; Dexilant [dexlansoprazole]; Doxycycline; Flagyl [metronidazole hcl]; Gabapentin; Penicillins; Penicillins cross reactors; Septra [sulfamethoxazole-trimethoprim]; Tizanidine; Topamax [topiramate]; Toradol [ketorolac tromethamine]; Amoxicillin; Diflucan [fluconazole]; Inapsine [droperidol]; Ketamine; Levofloxacin; Lyrica [  pregabalin]; Phenergan [promethazine hcl]; Phenothiazines; Primaxin [imipenem]; Prozac [fluoxetine hcl]; and Reglan [metoclopramide hcl]   Review of Systems Review of Systems  Gastrointestinal: Positive for abdominal pain.  All other systems reviewed and are negative.    Physical Exam Updated Vital Signs BP 120/84 (BP Location: Right Arm)   Pulse 93   Temp 98 F (36.7 C) (Oral)   Resp 20   LMP 10/14/2017   SpO2 94%   Physical Exam  Constitutional: She is oriented to person, place, and time. She appears well-developed and well-nourished.  HENT:  Head: Normocephalic and atraumatic.  Right Ear: External ear normal.  Left Ear: External ear normal.  Nose: Nose normal.  Mouth/Throat: Oropharynx is clear and moist.  Eyes: Conjunctivae and EOM are normal. Pupils are equal, round, and reactive to light.  Neck: Normal range of motion. Neck supple.  Cardiovascular: Normal rate, regular rhythm, normal heart sounds and intact distal pulses.  Pulmonary/Chest: Effort normal and breath sounds normal.    Abdominal: Soft. There is tenderness in the epigastric area.  Musculoskeletal: Normal range of motion.  Neurological: She is alert and oriented to person, place, and time.  Skin: Skin is warm and dry. Capillary refill takes less than 2 seconds.  Psychiatric: She has a normal mood and affect. Her behavior is normal. Judgment and thought content normal.  Nursing note and vitals reviewed.    ED Treatments / Results  Labs (all labs ordered are listed, but only abnormal results are displayed) Labs Reviewed  CBC WITH DIFFERENTIAL/PLATELET - Abnormal; Notable for the following components:      Result Value   WBC 20.0 (*)    Hemoglobin 9.6 (*)    HCT 31.4 (*)    MCV 69.2 (*)    MCH 21.1 (*)    RDW 17.0 (*)    Platelets 449 (*)    Neutro Abs 15.6 (*)    Monocytes Absolute 1.4 (*)    All other components within normal limits  COMPREHENSIVE METABOLIC PANEL - Abnormal; Notable for the following components:   Sodium 134 (*)    Chloride 99 (*)    Glucose, Bld 165 (*)    All other components within normal limits  LIPASE, BLOOD  URINALYSIS, ROUTINE W REFLEX MICROSCOPIC    EKG  EKG Interpretation None       Radiology No results found.  Procedures Procedures (including critical care time)  Medications Ordered in ED Medications  0.9 %  sodium chloride infusion (not administered)  HYDROmorphone (DILAUDID) injection 1 mg (not administered)  0.9 %  sodium chloride infusion (0 mLs Intravenous Stopped 10/28/17 0024)  HYDROmorphone (DILAUDID) injection 1 mg (1 mg Intravenous Given 10/27/17 2115)  famotidine (PEPCID) IVPB 20 mg premix (0 mg Intravenous Stopped 10/27/17 2306)  HYDROmorphone (DILAUDID) injection 1 mg (1 mg Intravenous Given 10/27/17 2216)  HYDROmorphone (DILAUDID) injection 2 mg (2 mg Intravenous Given 10/27/17 2326)     Initial Impression / Assessment and Plan / ED Course  I have reviewed the triage vital signs and the nursing notes.  Pertinent labs & imaging  results that were available during my care of the patient were reviewed by me and considered in my medical decision making (see chart for details).    Pt still in a lot of pain despite narcotics given.  Pt does not think she will be able to tolerate going home.  Pt d/w Dr. Antionette Charpyd ([facility]) for admission.  Final Clinical Impressions(s) / ED Diagnoses   Final diagnoses:  Acute on  chronic pancreatitis (HCC)  Intractable pain    ED Discharge Orders    None       Jacalyn Lefevre, MD 10/28/17 [phone]

## 2017-10-27 NOTE — ED Triage Notes (Signed)
Pt complains of a pancreatitis flare up for three days Pt states she's not nauseated and no vomiting but just in a lot of pain

## 2017-10-28 ENCOUNTER — Other Ambulatory Visit: Payer: Self-pay

## 2017-10-28 ENCOUNTER — Encounter (HOSPITAL_COMMUNITY): Payer: Self-pay | Admitting: Family Medicine

## 2017-10-28 DIAGNOSIS — D72828 Other elevated white blood cell count: Secondary | ICD-10-CM | POA: Diagnosis not present

## 2017-10-28 DIAGNOSIS — J45909 Unspecified asthma, uncomplicated: Secondary | ICD-10-CM | POA: Diagnosis present

## 2017-10-28 DIAGNOSIS — R7989 Other specified abnormal findings of blood chemistry: Secondary | ICD-10-CM | POA: Diagnosis present

## 2017-10-28 DIAGNOSIS — Z888 Allergy status to other drugs, medicaments and biological substances status: Secondary | ICD-10-CM | POA: Diagnosis not present

## 2017-10-28 DIAGNOSIS — E119 Type 2 diabetes mellitus without complications: Secondary | ICD-10-CM

## 2017-10-28 DIAGNOSIS — R1013 Epigastric pain: Secondary | ICD-10-CM | POA: Diagnosis present

## 2017-10-28 DIAGNOSIS — R52 Pain, unspecified: Secondary | ICD-10-CM | POA: Diagnosis not present

## 2017-10-28 DIAGNOSIS — J4530 Mild persistent asthma, uncomplicated: Secondary | ICD-10-CM | POA: Diagnosis not present

## 2017-10-28 DIAGNOSIS — Z794 Long term (current) use of insulin: Secondary | ICD-10-CM

## 2017-10-28 DIAGNOSIS — K861 Other chronic pancreatitis: Secondary | ICD-10-CM | POA: Diagnosis present

## 2017-10-28 DIAGNOSIS — K859 Acute pancreatitis without necrosis or infection, unspecified: Secondary | ICD-10-CM | POA: Diagnosis present

## 2017-10-28 DIAGNOSIS — D509 Iron deficiency anemia, unspecified: Secondary | ICD-10-CM | POA: Diagnosis present

## 2017-10-28 DIAGNOSIS — Z79899 Other long term (current) drug therapy: Secondary | ICD-10-CM | POA: Diagnosis not present

## 2017-10-28 DIAGNOSIS — K858 Other acute pancreatitis without necrosis or infection: Secondary | ICD-10-CM

## 2017-10-28 DIAGNOSIS — Z880 Allergy status to penicillin: Secondary | ICD-10-CM | POA: Diagnosis not present

## 2017-10-28 DIAGNOSIS — Z90411 Acquired partial absence of pancreas: Secondary | ICD-10-CM | POA: Diagnosis not present

## 2017-10-28 DIAGNOSIS — Z881 Allergy status to other antibiotic agents status: Secondary | ICD-10-CM | POA: Diagnosis not present

## 2017-10-28 DIAGNOSIS — E785 Hyperlipidemia, unspecified: Secondary | ICD-10-CM | POA: Diagnosis present

## 2017-10-28 DIAGNOSIS — M797 Fibromyalgia: Secondary | ICD-10-CM | POA: Diagnosis present

## 2017-10-28 DIAGNOSIS — E871 Hypo-osmolality and hyponatremia: Secondary | ICD-10-CM | POA: Diagnosis present

## 2017-10-28 LAB — RETICULOCYTES
RBC.: 4.51 MIL/uL (ref 3.87–5.11)
RETIC CT PCT: 1.3 % (ref 0.4–3.1)
Retic Count, Absolute: 58.6 10*3/uL (ref 19.0–186.0)

## 2017-10-28 LAB — COMPREHENSIVE METABOLIC PANEL
ALBUMIN: 3.5 g/dL (ref 3.5–5.0)
ALK PHOS: 77 U/L (ref 38–126)
ALT: 20 U/L (ref 14–54)
AST: 21 U/L (ref 15–41)
Anion gap: 7 (ref 5–15)
BUN: 10 mg/dL (ref 6–20)
CALCIUM: 8.4 mg/dL — AB (ref 8.9–10.3)
CO2: 25 mmol/L (ref 22–32)
CREATININE: 0.51 mg/dL (ref 0.44–1.00)
Chloride: 102 mmol/L (ref 101–111)
GFR calc non Af Amer: >60 mL/min (ref >60)
GLUCOSE: 150 mg/dL — AB (ref 65–99)
Potassium: 3.9 mmol/L (ref 3.5–5.1)
SODIUM: 134 mmol/L — AB (ref 135–145)
Total Bilirubin: 0.9 mg/dL (ref 0.3–1.2)
Total Protein: 7.1 g/dL (ref 6.5–8.1)

## 2017-10-28 LAB — FOLATE: FOLATE: 21.5 ng/mL (ref >5.9)

## 2017-10-28 LAB — CBC WITH DIFFERENTIAL/PLATELET
Basophils Absolute: 0 10*3/uL (ref 0.0–0.1)
Basophils Relative: 0 %
EOS ABS: 0 10*3/uL (ref 0.0–0.7)
Eosinophils Relative: 0 %
HCT: 30.7 % — ABNORMAL LOW (ref 36.0–46.0)
Hemoglobin: 9.6 g/dL — ABNORMAL LOW (ref 12.0–15.0)
Lymphocytes Relative: 15 %
Lymphs Abs: 2.6 10*3/uL (ref 0.7–4.0)
MCH: 21.3 pg — AB (ref 26.0–34.0)
MCHC: 31.3 g/dL (ref 30.0–36.0)
MCV: 68.1 fL — ABNORMAL LOW (ref 78.0–100.0)
MONO ABS: 1.4 10*3/uL — AB (ref 0.1–1.0)
Monocytes Relative: 8 %
NEUTROS ABS: 13.6 10*3/uL — AB (ref 1.7–7.7)
Neutrophils Relative %: 77 %
PLATELETS: 419 10*3/uL — AB (ref 150–400)
RBC: 4.51 MIL/uL (ref 3.87–5.11)
RDW: 16.7 % — ABNORMAL HIGH (ref 11.5–15.5)
WBC: 17.6 10*3/uL — ABNORMAL HIGH (ref 4.0–10.5)

## 2017-10-28 LAB — ABO/RH: ABO/RH(D): A POS

## 2017-10-28 LAB — GLUCOSE, CAPILLARY
GLUCOSE-CAPILLARY: 138 mg/dL — AB (ref 65–99)
GLUCOSE-CAPILLARY: 101 mg/dL — AB (ref 65–99)
GLUCOSE-CAPILLARY: 91 mg/dL (ref 65–99)
GLUCOSE-CAPILLARY: 146 mg/dL — AB (ref 65–99)
Glucose-Capillary: 85 mg/dL (ref 65–99)

## 2017-10-28 LAB — IRON AND TIBC
Iron: 17 ug/dL — ABNORMAL LOW (ref 28–170)
SATURATION RATIOS: 4 % — AB (ref 10.4–31.8)
TIBC: 484 ug/dL — ABNORMAL HIGH (ref 250–450)
UIBC: 467 ug/dL

## 2017-10-28 LAB — FERRITIN: Ferritin: 3 ng/mL — ABNORMAL LOW (ref 11–307)

## 2017-10-28 LAB — TYPE AND SCREEN
ABO/RH(D): A POS
Antibody Screen: NEGATIVE

## 2017-10-28 LAB — VITAMIN B12: Vitamin B-12: 434 pg/mL (ref 180–914)

## 2017-10-28 LAB — CBG MONITORING, ED: GLUCOSE-CAPILLARY: 142 mg/dL — AB (ref 65–99)

## 2017-10-28 LAB — HIV ANTIBODY (ROUTINE TESTING W REFLEX): HIV Screen 4th Generation wRfx: NONREACTIVE

## 2017-10-28 MED ORDER — NALOXONE HCL 0.4 MG/ML IJ SOLN: 0.4000 mg | INTRAMUSCULAR | Status: DC | PRN

## 2017-10-28 MED ORDER — HYDROMORPHONE 1 MG/ML IV SOLN: INTRAVENOUS | Status: DC

## 2017-10-28 MED ORDER — POLYETHYLENE GLYCOL 3350 17 G PO PACK
17.0000 g | PACK | Freq: Every day | ORAL | Status: DC
  Administered 2017-10-28: 17 g via ORAL
  Filled 2017-10-28 (×3): qty 1

## 2017-10-28 MED ORDER — FAMOTIDINE IN NACL 20-0.9 MG/50ML-% IV SOLN
20.0000 mg | Freq: Two times a day (BID) | INTRAVENOUS | Status: DC
  Administered 2017-10-28 – 2017-10-30 (×5): 20 mg via INTRAVENOUS
  Filled 2017-10-28 (×7): qty 50

## 2017-10-28 MED ORDER — HYDROMORPHONE HCL 1 MG/ML IJ SOLN: 0.5000 mg | INTRAMUSCULAR | Status: DC | PRN

## 2017-10-28 MED ORDER — SODIUM CHLORIDE 0.9 % IV SOLN: 1000.0000 mg | Freq: Once | INTRAVENOUS | Status: DC

## 2017-10-28 MED ORDER — ALBUTEROL SULFATE HFA 108 (90 BASE) MCG/ACT IN AERS: 1.0000 | INHALATION_SPRAY | Freq: Four times a day (QID) | RESPIRATORY_TRACT | Status: DC | PRN

## 2017-10-28 MED ORDER — ONDANSETRON HCL 4 MG/2ML IJ SOLN: 4.0000 mg | Freq: Four times a day (QID) | INTRAMUSCULAR | Status: DC | PRN

## 2017-10-28 MED ORDER — ALBUTEROL SULFATE (2.5 MG/3ML) 0.083% IN NEBU
2.5000 mg | INHALATION_SOLUTION | Freq: Four times a day (QID) | RESPIRATORY_TRACT | Status: DC | PRN
  Administered 2017-10-28: 2.5 mg via RESPIRATORY_TRACT
  Filled 2017-10-28: qty 3

## 2017-10-28 MED ORDER — ONDANSETRON HCL 4 MG PO TABS: 4.0000 mg | ORAL_TABLET | Freq: Four times a day (QID) | ORAL | Status: DC | PRN

## 2017-10-28 MED ORDER — ACETAMINOPHEN 325 MG PO TABS
650.0000 mg | ORAL_TABLET | Freq: Four times a day (QID) | ORAL | Status: DC | PRN
  Filled 2017-10-28: qty 2

## 2017-10-28 MED ORDER — POTASSIUM CHLORIDE 2 MEQ/ML IV SOLN
INTRAVENOUS | Status: DC
  Administered 2017-10-28 – 2017-10-30 (×4): via INTRAVENOUS
  Filled 2017-10-28 (×7): qty 1000

## 2017-10-28 MED ORDER — SODIUM CHLORIDE 0.9 % IV SOLN: Freq: Once | INTRAVENOUS | Status: DC

## 2017-10-28 MED ORDER — SODIUM CHLORIDE 0.9 % IV SOLN
INTRAVENOUS | Status: DC
  Administered 2017-10-28 (×2): via INTRAVENOUS

## 2017-10-28 MED ORDER — FERUMOXYTOL INJECTION 510 MG/17 ML
510.0000 mg | Freq: Once | INTRAVENOUS | Status: AC
  Administered 2017-10-28: 510 mg via INTRAVENOUS
  Filled 2017-10-28: qty 17

## 2017-10-28 MED ORDER — DIPHENHYDRAMINE HCL 12.5 MG/5ML PO ELIX: 12.5000 mg | ORAL_SOLUTION | Freq: Four times a day (QID) | ORAL | Status: DC | PRN

## 2017-10-28 MED ORDER — ACETAMINOPHEN 650 MG RE SUPP: 650.0000 mg | Freq: Four times a day (QID) | RECTAL | Status: DC | PRN

## 2017-10-28 MED ORDER — SODIUM CHLORIDE 0.9% FLUSH: 9.0000 mL | INTRAVENOUS | Status: DC | PRN

## 2017-10-28 MED ORDER — HYDROMORPHONE HCL 1 MG/ML IJ SOLN
1.0000 mg | Freq: Once | INTRAMUSCULAR | Status: AC
  Administered 2017-10-28: 1 mg via INTRAVENOUS
  Filled 2017-10-28: qty 1

## 2017-10-28 MED ORDER — HYDROMORPHONE HCL 1 MG/ML IJ SOLN
1.0000 mg | INTRAMUSCULAR | Status: DC | PRN
  Administered 2017-10-28 – 2017-10-29 (×8): 2 mg via INTRAVENOUS
  Administered 2017-10-29: 1 mg via INTRAVENOUS
  Administered 2017-10-29: 2 mg via INTRAVENOUS
  Administered 2017-10-29: 1 mg via INTRAVENOUS
  Filled 2017-10-28 (×5): qty 2
  Filled 2017-10-28: qty 1
  Filled 2017-10-28 (×5): qty 2

## 2017-10-28 MED ORDER — DIPHENHYDRAMINE HCL 50 MG/ML IJ SOLN: 12.5000 mg | Freq: Four times a day (QID) | INTRAMUSCULAR | Status: DC | PRN

## 2017-10-28 MED ORDER — SENNOSIDES-DOCUSATE SODIUM 8.6-50 MG PO TABS: 1.0000 | ORAL_TABLET | Freq: Every evening | ORAL | Status: DC | PRN

## 2017-10-28 MED ORDER — SODIUM CHLORIDE 0.9 % IV SOLN: 25.0000 mg | Freq: Once | INTRAVENOUS | Status: DC

## 2017-10-28 MED ORDER — INSULIN GLARGINE 100 UNIT/ML ~~LOC~~ SOLN
5.0000 [IU] | Freq: Two times a day (BID) | SUBCUTANEOUS | Status: DC
  Administered 2017-10-28 – 2017-10-30 (×5): 5 [IU] via SUBCUTANEOUS
  Filled 2017-10-28 (×6): qty 0.05

## 2017-10-28 MED ORDER — BUDESONIDE 0.25 MG/2ML IN SUSP
0.2500 mg | Freq: Two times a day (BID) | RESPIRATORY_TRACT | Status: DC
  Administered 2017-10-28: 0.25 mg via RESPIRATORY_TRACT
  Filled 2017-10-28 (×4): qty 2

## 2017-10-28 MED ORDER — ENOXAPARIN SODIUM 40 MG/0.4ML ~~LOC~~ SOLN
40.0000 mg | SUBCUTANEOUS | Status: DC
  Administered 2017-10-28 – 2017-10-30 (×3): 40 mg via SUBCUTANEOUS
  Filled 2017-10-28 (×3): qty 0.4

## 2017-10-28 MED ORDER — POLYVINYL ALCOHOL 1.4 % OP SOLN
2.0000 [drp] | Freq: Three times a day (TID) | OPHTHALMIC | Status: DC | PRN
  Filled 2017-10-28: qty 15

## 2017-10-28 MED ORDER — BECLOMETHASONE DIPROP HFA 80 MCG/ACT IN AERB: 2.0000 | INHALATION_SPRAY | Freq: Every day | RESPIRATORY_TRACT | Status: DC

## 2017-10-28 MED ORDER — INSULIN GLARGINE 100 UNIT/ML ~~LOC~~ SOLN
10.0000 [IU] | Freq: Two times a day (BID) | SUBCUTANEOUS | Status: DC
  Administered 2017-10-28: 10 [IU] via SUBCUTANEOUS
  Filled 2017-10-28 (×2): qty 0.1

## 2017-10-28 MED ORDER — ONDANSETRON HCL 4 MG/2ML IJ SOLN
4.0000 mg | Freq: Four times a day (QID) | INTRAMUSCULAR | Status: DC | PRN
  Filled 2017-10-28 (×2): qty 2

## 2017-10-28 MED ORDER — MELATONIN 5 MG PO TABS: 1.0000 | ORAL_TABLET | Freq: Every day | ORAL | Status: DC

## 2017-10-28 MED ORDER — POTASSIUM CHLORIDE 2 MEQ/ML IV SOLN
INTRAVENOUS | Status: DC
  Filled 2017-10-28: qty 1000

## 2017-10-28 MED ORDER — INSULIN ASPART 100 UNIT/ML ~~LOC~~ SOLN
0.0000 [IU] | SUBCUTANEOUS | Status: DC
  Administered 2017-10-28: 1 [IU] via SUBCUTANEOUS
  Administered 2017-10-29: 2 [IU] via SUBCUTANEOUS
  Administered 2017-10-29: 1 [IU] via SUBCUTANEOUS
  Administered 2017-10-29: 2 [IU] via SUBCUTANEOUS
  Administered 2017-10-29: 1 [IU] via SUBCUTANEOUS
  Administered 2017-10-29 (×2): 2 [IU] via SUBCUTANEOUS
  Administered 2017-10-30 (×3): 1 [IU] via SUBCUTANEOUS
  Filled 2017-10-28: qty 1

## 2017-10-28 MED ORDER — HYDROMORPHONE 1 MG/ML IV SOLN
INTRAVENOUS | Status: DC
  Administered 2017-10-28: 15:00:00 via INTRAVENOUS
  Administered 2017-10-28: 4.2 mg via INTRAVENOUS
  Administered 2017-10-29: 3.94 mg via INTRAVENOUS
  Administered 2017-10-29: 4.2 mg via INTRAVENOUS
  Administered 2017-10-29: 0.9 mg via INTRAVENOUS
  Administered 2017-10-29: 2.4 mg via INTRAVENOUS
  Administered 2017-10-29: 0.6 mg via INTRAVENOUS
  Administered 2017-10-29: 3.4 mg via INTRAVENOUS
  Administered 2017-10-30 (×2): 0.9 mg via INTRAVENOUS
  Administered 2017-10-30: 1.5 mg via INTRAVENOUS
  Filled 2017-10-28: qty 25

## 2017-10-28 MED ORDER — BISACODYL 5 MG PO TBEC: 5.0000 mg | DELAYED_RELEASE_TABLET | Freq: Every day | ORAL | Status: DC | PRN

## 2017-10-28 NOTE — ED Notes (Signed)
Pt refused blood sugar because she states being on a slightly different sliding scale at home. She says she does not give anything for a blood sugar under 150 and does not want her insulin at this time.

## 2017-10-28 NOTE — H&P (Signed)
History and Physical    Heather Wagner [id number] DOB: Apr 04, 1976 DOA: 10/27/2017  PCP: Maurice SmallGriffin, Elaine, MD   Patient coming from: Home  Chief Complaint: Abdominal pain  HPI: Heather Wagner is a 41 y.o. female with medical history significant for hereditary pancreatitis status post Whipple procedure, insulin-dependent diabetes mellitus, and asthma, now presenting to the emergency department for evaluation of severe epigastric pain.  Patient reports that she had been doing fairly well for the past several months with no flares in her chronic pancreatitis, until approximately 3 days ago when she noted the insidious development of severe, sharp, epigastric pain. No fever or chills. No chest pain, cough , or SOB.   ED Course: Upon arrival to the ED, patient is found to be afebrile, saturating well on room air, tachycardic in the 110s, and with stable blood pressure.  Chemistry panel is notable for mild hyponatremia and CBC features a leukocytosis to 20,000, stable microcytic anemia with hemoglobin of 9.6, and stable chronic thrombocytosis with platelets 449,000.  Patient was started on IV fluids, given IV Pepcid, and treated with multiple doses of IV Dilaudid in the ED.  She remained hemodynamically stable with resolution of her tachycardia, but continues to complain of severe epigastric pain.  She will be admitted to the medical-surgical unit for ongoing evaluation and management of severe epigastric pain, likely secondary to acute pancreatitis.  Review of Systems:  All other systems reviewed and apart from HPI, are negative.  Past Medical History:  Diagnosis Date  . Asthma   . Chronic sinusitis   . Diabetes mellitus   . Fibromyalgia   . Hyperlipidemia   . IBS (irritable bowel syndrome)   . Migraine without aura, with intractable migraine, so stated, without mention of status migrainosus 01/25/2014  . Migraines   . Osteoarthritis of both knees 10/24/2016   moderate  .  Pancreatitis chronic   . Pelvic floor dysfunction 10/24/2016  . Sjogren's syndrome (HCC)   . Vulvar vestibulitis     Past Surgical History:  Procedure Laterality Date  . BREAST REDUCTION SURGERY  2010  . DILATION AND CURETTAGE OF UTERUS  2007  . ERCP     Removed stone from Bile duct  . ERCP     with MRI  . PANCREAS SURGERY  1997  . whiple  1997  . whipple's       reports that  has never smoked. she has never used smokeless tobacco. She reports that she does not drink alcohol or use drugs.  Allergies  Allergen Reactions  . Ciprofloxacin Hcl Other (See Comments)    Flare up of pancreatitis, severe yeast infection   . Nortriptyline Hcl Shortness Of Breath    Swelling anxiety  . Amitriptyline Other (See Comments)    sedation  . Compazine [Prochlorperazine Maleate] Other (See Comments)    Neurological - uncontrollable eye movements  . Cymbalta [Duloxetine Hcl] Nausea And Vomiting    insomnia  . Dexilant [Dexlansoprazole] Nausea Only  . Doxycycline Nausea And Vomiting  . Flagyl [Metronidazole Hcl] Nausea And Vomiting    Skin rash  . Gabapentin Nausea And Vomiting and Other (See Comments)    Agitation  . Penicillins   . Penicillins Cross Reactors Nausea And Vomiting    Skin rash/hives  . Septra [Sulfamethoxazole-Trimethoprim] Other (See Comments)    Severe upset stomach, nausea, stomach pain, over heating.   . Tizanidine Nausea And Vomiting and Other (See Comments)    Agitation  . Topamax [Topiramate] Other (See Comments)  Mood change, anxiety, headaches  . Toradol [Ketorolac Tromethamine] Other (See Comments)    Anxiety.  . Amoxicillin Nausea And Vomiting and Anxiety    Has patient had a PCN reaction causing immediate rash, facial/tongue/throat swelling, SOB or lightheadedness with hypotension:  Has patient had a PCN reaction causing severe rash involving mucus membranes or skin necrosis:  Has patient had a PCN reaction that required hospitalization  Has patient had  a PCN reaction occurring within the last 10 years:  If all of the above answers are "NO", then may proceed with Cephalosporin use.   . Diflucan [Fluconazole] Diarrhea and Palpitations    Stomach pain, pancreatitis flare up, severe headache   . Inapsine [Droperidol] Nausea And Vomiting  . Ketamine Anxiety    Agitation, insomnia   . Levofloxacin Palpitations and Other (See Comments)    Light headed, anxiety, blood rushing, shaking, sweating.l   . Lyrica [Pregabalin] Other (See Comments)    Mood changes- become listless, disinterested  . Phenergan [Promethazine Hcl] Anxiety  . Phenothiazines Hives, Nausea And Vomiting and Rash  . Primaxin [Imipenem] Nausea And Vomiting  . Prozac [Fluoxetine Hcl] Anxiety  . Reglan [Metoclopramide Hcl] Anxiety    Family History  Problem Relation Age of Onset  . Asthma Mother   . Migraines Sister   . Pancreatitis Paternal Grandfather   . Asthma Brother   . Pancreatic cancer Other   . Pancreatitis Maternal Uncle      Prior to Admission medications   Medication Sig Start Date End Date Taking? Authorizing Provider  albuterol (PROVENTIL HFA;VENTOLIN HFA) 108 (90 BASE) MCG/ACT inhaler Inhale 1-2 puffs into the lungs every 6 (six) hours as needed for wheezing. Asthma   Yes [provider]  BAYER CONTOUR NEXT TEST test strip USE TO CHECK BLOOD SUGAR 4X A DAY (B08.9) 09/27/16  Yes [provider]  Beclomethasone Diprop HFA (QVAR REDIHALER) 80 MCG/ACT AERB Inhale 2 puffs into the lungs daily. 03/21/17  Yes Lupita LeashMcQuaid, Douglas B, MD  calcium carbonate (TUMS EX) 750 MG chewable tablet Chew 2-4 tablets by mouth 2 (two) times daily as needed for heartburn. No more than 10 a day   Yes [provider]  carboxymethylcellulose (REFRESH PLUS) 0.5 % SOLN 1-2 drops every 2 (two) hours as needed for Dry eyes   Yes [provider]  diclofenac sodium (VOLTAREN) 1 % GEL APPLY 3 GRAMS TO 3 LARGE JOINTS 3 TIMES DAILY AS NEEDED FOR PAIN 09/27/16   Yes [provider]  HYDROcodone-acetaminophen (NORCO/VICODIN) 5-325 MG per tablet Take 1-2 tablets by mouth every 6 (six) hours as needed for moderate pain.    Yes [provider]  ibuprofen (ADVIL,MOTRIN) 200 MG tablet Take 400-800 mg by mouth every 8 (eight) hours as needed for moderate pain.    Yes [provider]  insulin aspart (NOVOLOG) 100 UNIT/ML injection Inject 1-5 Units into the skin. 5-6 times daily with meals and extra if needed, injecting 1 unit for every 15 carbs, 1 unit every 50 over 150.   Yes [provider]  Insulin Glargine (BASAGLAR KWIKPEN) 100 UNIT/ML SOPN Inject 21 Units into the skin 2 (two) times daily.   Yes [provider]  Magnesium 300 MG CAPS Take 300 mg by mouth every evening.    Yes [provider]  Melatonin 5 MG TABS Take 1 tablet by mouth at bedtime.   Yes [provider]  MICROLET LANCETS MISC AS DIRECTED FOUR TIMES A DAY 30 DAYS 08/05/16  Yes [provider]  Multiple Vitamins-Minerals (ALIVE WOMENS GUMMY PO) Take 2 tablets by mouth daily after breakfast.    Yes [provider]  omeprazole (PRILOSEC) 20 MG capsule Take 20 mg by mouth every morning.    Yes [provider]  OVER THE COUNTER MEDICATION Take 1 Dose by mouth at bedtime as needed (insomnia). Valarian/liqourice root tea   Yes [provider]  polyethylene glycol (MIRALAX / GLYCOLAX) packet Take 17 g by mouth daily as needed for mild constipation.   Yes [provider]  Probiotic Product (PROBIOTIC DAILY PO) Take 1 capsule by mouth daily. Reported on 01/16/2016   Yes [provider]  rizatriptan (MAXALT) 10 MG tablet Take 10 mg by mouth as needed for migraine. May repeat in 2 hours if needed for headaches   Yes [provider]  ZENPEP 25000 units CPEP Take 1-3 capsules by mouth 3 (three) times daily with meals as needed. 3-4 caps per meal and 1-2 caps per snack 12/03/15  Yes [provider]    Physical Exam: Vitals:   10/27/17 2014 10/27/17 2132 10/28/17 0000 10/28/17 0014  BP: (!) 144/92 (!) 141/93 120/84 120/84  Pulse: (!) 111 96 95 93  Resp: 16 15  20   Temp: 98 F (36.7 C)     TempSrc: Oral     SpO2: 98% 97% 97% 94%      Constitutional: NAD, calm, in obvious discomfort Eyes: PERTLA, lids and conjunctivae normal ENMT: Mucous membranes are moist. Posterior pharynx clear of any exudate or lesions.   Neck: normal, supple, no masses, no thyromegaly Respiratory: clear to auscultation bilaterally, no wheezing, no crackles. Normal respiratory effort.  Cardiovascular: Rate ~110 and regular. No extremity edema. No significant JVD. Abdomen: Soft, non-distended, exquisitely tender in epigastrium. No rebound pain or guarding. Bowel sounds active.  Musculoskeletal: no clubbing / cyanosis. No joint deformity upper and lower extremities.    Skin: no significant rashes, lesions, ulcers. Warm, dry, well-perfused. Neurologic: CN 2-12 grossly intact. Sensation intact. Strength 5/5 in all 4 limbs.  Psychiatric:  Alert and oriented x 3. Calm, cooperative.     Labs on Admission: I have personally reviewed following labs and imaging studies  CBC: Recent Labs  Lab 10/27/17 2119  WBC 20.0*  NEUTROABS 15.6*  HGB 9.6*  HCT 31.4*  MCV 69.2*  PLT 449*   Basic Metabolic Panel: Recent Labs  Lab 10/27/17 2119  NA 134*  K 3.5  CL 99*  CO2 25  GLUCOSE 165*  BUN 12  CREATININE 0.50  CALCIUM 8.9   GFR: CrCl cannot be calculated (Unknown ideal weight.). Liver Function Tests: Recent Labs  Lab 10/27/17 2119  AST 21  ALT 21  ALKPHOS 81  BILITOT 0.5  PROT 7.5  ALBUMIN 3.8   Recent Labs  Lab 10/27/17 2119  LIPASE 34   No results for input(s): AMMONIA in the last 168 hours. Coagulation Profile: No results for input(s): INR, PROTIME in the last 168 hours. Cardiac Enzymes: No results for input(s): CKTOTAL, CKMB, CKMBINDEX, TROPONINI in the last 168  hours. BNP (last 3 results) No results for input(s): PROBNP in the last 8760 hours. HbA1C: No results for input(s): HGBA1C in the last 72 hours. CBG: No results for input(s): GLUCAP in the last 168 hours. Lipid Profile: No results for input(s): CHOL, HDL, LDLCALC, TRIG, CHOLHDL, LDLDIRECT in the last 72 hours. Thyroid Function Tests: No results for input(s): TSH, T4TOTAL, FREET4, T3FREE, THYROIDAB in the last 72 hours. Anemia Panel: No  results for input(s): VITAMINB12, FOLATE, FERRITIN, TIBC, IRON, RETICCTPCT in the last 72 hours. Urine analysis:    Component Value Date/Time   COLORURINE YELLOW 10/27/2017 2042   APPEARANCEUR CLEAR 10/27/2017 2042   LABSPEC 1.025 10/27/2017 2042   PHURINE 7.0 10/27/2017 2042   GLUCOSEU NEGATIVE 10/27/2017 2042   HGBUR NEGATIVE 10/27/2017 2042   BILIRUBINUR NEGATIVE 10/27/2017 2042   KETONESUR NEGATIVE 10/27/2017 2042   PROTEINUR NEGATIVE 10/27/2017 2042   UROBILINOGEN 0.2 06/29/2015 0021   NITRITE NEGATIVE 10/27/2017 2042   LEUKOCYTESUR NEGATIVE 10/27/2017 2042   Sepsis Labs: @LABRCNTIP (procalcitonin:4,lacticidven:4) )No results found for this or any previous visit (from the past 240 hour(s)).   Radiological Exams on Admission: No results found.  EKG: Not performed.   Assessment/Plan  1. Acute pancreatitis; hereditary pancreatitis  - Pt has hx of hereditary pancreatitis followed at [facility] and s/p Whipple in '97 with removal of 80% pancreas - She reports going several months without flare until developing her characteristic symptoms 3 days ago, worsening despite using Norco at home  - She continues to complain of severe pain after multiples doses IV narcotic in ED  - Plan for bowel rest, fluid-resuscitation with NS, pain-control, monitor for complications    2. Insulin-dependent DM  - A1c was 8.7% in February 2017  - Managed at home with Levemir 20 units BID and Novolog 1-5 units per sliding-scale  - Pt will be NPO; follow CBG's, continue  Levemir with 10 units BID plus a low-intensity SSI    3. Microcytic anemia  - Hgb is stable at 9.6; MCV is 69.2  - No bleeding evident at time of admission  - Plan to type and screen and check anemia panel    4. Leukocytosis  - WBC 20,000 on admission without fever  - Likely reactive to #1  - Plan to continue infection surveillance, culture if febrile  5. Asthma  - Stable  - Continue scheduled Qvar and prn albuterol     DVT prophylaxis: Lovenox Code Status: Full  Family Communication: Discussed with patient Disposition Plan: Admit to med-surg Consults called: None Admission status: Inpatient    Briscoe Deutscher, MD [facility] Pager [phone]  If 7PM-7AM, please contact night-coverage [email or web address] Password [facility]  10/28/2017, 12:39 AM

## 2017-10-28 NOTE — ED Notes (Signed)
Pt. Documented in error Place order for blood cultures x 2 (from different sites) for Temp > 101F.

## 2017-10-28 NOTE — ED Notes (Signed)
ED TO INPATIENT HANDOFF REPORT  Name/Age/Gender Fanny Skates 41 y.o. female  Code Status    Code Status Orders  (From admission, onward)        Start     Ordered   10/28/17 0037  Full code  Continuous     10/28/17 0039    Code Status History    Date Active Date Inactive Code Status Order ID Comments User Context   01/05/2016 05:05 01/07/2016 15:01 Full Code [id number]  Edwin Dada, MD Inpatient   06/18/2014 22:13 06/21/2014 18:28 Full Code [id number]  Etta Quill, DO ED   09/25/2013 07:03 09/27/2013 16:26 Full Code [id number]  Rise Patience, MD Inpatient      Home/SNF/Other Home  Chief Complaint pancreatitis attack  Level of Care/Admitting Diagnosis ED Disposition    ED Disposition Condition [place]: [facility] [[id number]]  Level of Care: Med-Surg [16]  Diagnosis: Acute on chronic pancreatitis [facility]) [[id number]]  Admitting Physician: Vianne Bulls [[id number]]  Attending Physician: Vianne Bulls [[id number]]  Estimated length of stay: past midnight tomorrow  Certification:: I certify this patient will need inpatient services for at least 2 midnights  PT Class (Do Not Modify): Inpatient [101]  PT Acc Code (Do Not Modify): Private [1]       Medical History Past Medical History:  Diagnosis Date  . Asthma   . Chronic sinusitis   . Diabetes mellitus   . Fibromyalgia   . Hyperlipidemia   . IBS (irritable bowel syndrome)   . Migraine without aura, with intractable migraine, so stated, without mention of status migrainosus 01/25/2014  . Migraines   . Osteoarthritis of both knees 10/24/2016   moderate  . Pancreatitis chronic   . Pelvic floor dysfunction 10/24/2016  . Sjogren's syndrome ([place])   . Vulvar vestibulitis     Allergies Allergies  Allergen Reactions  . Ciprofloxacin Hcl Other (See Comments)    Flare up of pancreatitis, severe yeast infection   . Nortriptyline Hcl Shortness Of Breath     Swelling anxiety  . Amitriptyline Other (See Comments)    sedation  . Compazine [Prochlorperazine Maleate] Other (See Comments)    Neurological - uncontrollable eye movements  . Cymbalta [Duloxetine Hcl] Nausea And Vomiting    insomnia  . Dexilant [Dexlansoprazole] Nausea Only  . Doxycycline Nausea And Vomiting  . Flagyl [Metronidazole Hcl] Nausea And Vomiting    Skin rash  . Gabapentin Nausea And Vomiting and Other (See Comments)    Agitation  . Penicillins   . Penicillins Cross Reactors Nausea And Vomiting    Skin rash/hives  . Septra [Sulfamethoxazole-Trimethoprim] Other (See Comments)    Severe upset stomach, nausea, stomach pain, over heating.   . Tizanidine Nausea And Vomiting and Other (See Comments)    Agitation  . Topamax [Topiramate] Other (See Comments)    Mood change, anxiety, headaches  . Toradol [Ketorolac Tromethamine] Other (See Comments)    Anxiety.  . Amoxicillin Nausea And Vomiting and Anxiety    Has patient had a PCN reaction causing immediate rash, facial/tongue/throat swelling, SOB or lightheadedness with hypotension:  Has patient had a PCN reaction causing severe rash involving mucus membranes or skin necrosis:  Has patient had a PCN reaction that required hospitalization  Has patient had a PCN reaction occurring within the last 10 years:  If all of the above answers are "NO", then may proceed with Cephalosporin use.   . Diflucan [Fluconazole] Diarrhea and Palpitations  Stomach pain, pancreatitis flare up, severe headache   . Inapsine [Droperidol] Nausea And Vomiting  . Ketamine Anxiety    Agitation, insomnia   . Levofloxacin Palpitations and Other (See Comments)    Light headed, anxiety, blood rushing, shaking, sweating.l   . Lyrica [Pregabalin] Other (See Comments)    Mood changes- become listless, disinterested  . Phenergan [Promethazine Hcl] Anxiety  . Phenothiazines Hives, Nausea And Vomiting and Rash  . Primaxin [Imipenem] Nausea And Vomiting   . Prozac [Fluoxetine Hcl] Anxiety  . Reglan [Metoclopramide Hcl] Anxiety    IV Location/Drains/Wounds Patient Lines/Drains/Airways Status   Active Line/Drains/Airways    Name:   Placement date:   Placement time:   Site:   Days:   Peripheral IV 10/27/17 Left   10/27/17    2117    -   1          Labs/Imaging Results for orders placed or performed during the hospital encounter of 10/27/17 (from the past 48 hour(s))  Urinalysis, Routine w reflex microscopic     Status: None   Collection Time: 10/27/17  8:42 PM  Result Value Ref Range   Color, Urine YELLOW YELLOW   APPearance CLEAR CLEAR   Specific Gravity, Urine 1.025 1.005 - 1.030   pH 7.0 5.0 - 8.0   Glucose, UA NEGATIVE NEGATIVE mg/dL   Hgb urine dipstick NEGATIVE NEGATIVE   Bilirubin Urine NEGATIVE NEGATIVE   Ketones, ur NEGATIVE NEGATIVE mg/dL   Protein, ur NEGATIVE NEGATIVE mg/dL   Nitrite NEGATIVE NEGATIVE   Leukocytes, UA NEGATIVE NEGATIVE  CBC with Differential     Status: Abnormal   Collection Time: 10/27/17  9:19 PM  Result Value Ref Range   WBC 20.0 (H) 4.0 - 10.5 K/uL   RBC 4.54 3.87 - 5.11 MIL/uL   Hemoglobin 9.6 (L) 12.0 - 15.0 g/dL   HCT 31.4 (L) 36.0 - 46.0 %   MCV 69.2 (L) 78.0 - 100.0 fL   MCH 21.1 (L) 26.0 - 34.0 pg   MCHC 30.6 30.0 - 36.0 g/dL   RDW 17.0 (H) 11.5 - 15.5 %   Platelets 449 (H) 150 - 400 K/uL   Neutrophils Relative % 78 %   Neutro Abs 15.6 (H) 1.7 - 7.7 K/uL   Lymphocytes Relative 14 %   Lymphs Abs 2.8 0.7 - 4.0 K/uL   Monocytes Relative 7 %   Monocytes Absolute 1.4 (H) 0.1 - 1.0 K/uL   Eosinophils Relative 1 %   Eosinophils Absolute 0.2 0.0 - 0.7 K/uL   Basophils Relative 0 %   Basophils Absolute 0.0 0.0 - 0.1 K/uL  Comprehensive metabolic panel     Status: Abnormal   Collection Time: 10/27/17  9:19 PM  Result Value Ref Range   Sodium 134 (L) 135 - 145 mmol/L   Potassium 3.5 3.5 - 5.1 mmol/L   Chloride 99 (L) 101 - 111 mmol/L   CO2 25 22 - 32 mmol/L   Glucose, Bld 165 (H) 65  - 99 mg/dL   BUN 12 6 - 20 mg/dL   Creatinine, Ser 0.50 0.44 - 1.00 mg/dL   Calcium 8.9 8.9 - 10.3 mg/dL   Total Protein 7.5 6.5 - 8.1 g/dL   Albumin 3.8 3.5 - 5.0 g/dL   AST 21 15 - 41 U/L   ALT 21 14 - 54 U/L   Alkaline Phosphatase 81 38 - 126 U/L   Total Bilirubin 0.5 0.3 - 1.2 mg/dL   GFR calc non Af Amer >60 >  60 mL/min   GFR calc Af Amer >60 >60 mL/min    Comment: (NOTE) The eGFR has been calculated using the CKD EPI equation. This calculation has not been validated in all clinical situations. eGFR's persistently <60 mL/min signify possible Chronic Kidney Disease.    Anion gap 10 5 - 15  Lipase, blood     Status: None   Collection Time: 10/27/17  9:19 PM  Result Value Ref Range   Lipase 34 11 - 51 U/L  CBC WITH DIFFERENTIAL     Status: Abnormal (Preliminary result)   Collection Time: 10/28/17  1:25 AM  Result Value Ref Range   WBC 17.6 (H) 4.0 - 10.5 K/uL   RBC 4.51 3.87 - 5.11 MIL/uL   Hemoglobin 9.6 (L) 12.0 - 15.0 g/dL   HCT 30.7 (L) 36.0 - 46.0 %   MCV 68.1 (L) 78.0 - 100.0 fL   MCH 21.3 (L) 26.0 - 34.0 pg   MCHC 31.3 30.0 - 36.0 g/dL   RDW 16.7 (H) 11.5 - 15.5 %   Platelets 419 (H) 150 - 400 K/uL   Neutrophils Relative % PENDING %   Neutro Abs PENDING 1.7 - 7.7 K/uL   Band Neutrophils PENDING %   Lymphocytes Relative PENDING %   Lymphs Abs PENDING 0.7 - 4.0 K/uL   Monocytes Relative PENDING %   Monocytes Absolute PENDING 0.1 - 1.0 K/uL   Eosinophils Relative PENDING %   Eosinophils Absolute PENDING 0.0 - 0.7 K/uL   Basophils Relative PENDING %   Basophils Absolute PENDING 0.0 - 0.1 K/uL   WBC Morphology PENDING    RBC Morphology PENDING    Smear Review PENDING    nRBC PENDING 0 /100 WBC   Metamyelocytes Relative PENDING %   Myelocytes PENDING %   Promyelocytes Absolute PENDING %   Blasts PENDING %  Reticulocytes     Status: None   Collection Time: 10/28/17  1:25 AM  Result Value Ref Range   Retic Ct Pct 1.3 0.4 - 3.1 %   RBC. 4.51 3.87 - 5.11 MIL/uL    Retic Count, Absolute 58.6 19.0 - 186.0 K/uL  POC CBG, ED     Status: Abnormal   Collection Time: 10/28/17  1:37 AM  Result Value Ref Range   Glucose-Capillary 142 (H) 65 - 99 mg/dL   No results found.  Pending Labs Unresulted Labs (From admission, onward)   Start     Ordered   11/04/17 0500  Creatinine, serum  (enoxaparin (LOVENOX)    CrCl >/= 30 ml/min)  Weekly,   R    Comments:  while on enoxaparin therapy    10/28/17 0039   10/28/17 0500  HIV antibody (Routine Testing)  Tomorrow morning,   R     10/28/17 0039   10/28/17 0500  Comprehensive metabolic panel  Tomorrow morning,   R     10/28/17 0039   10/28/17 0040  Vitamin B12  (Anemia Panel (PNL))  Once,   R     10/28/17 0039   10/28/17 0040  Folate  (Anemia Panel (PNL))  Once,   R     10/28/17 0039   10/28/17 0040  Iron and TIBC  (Anemia Panel (PNL))  Once,   R     10/28/17 0039   10/28/17 0040  Ferritin  (Anemia Panel (PNL))  Once,   R     10/28/17 0039   10/28/17 0039  Type and screen [place]  Once,  R    Comments:  [place]    10/28/17 0039      Vitals/Pain Today's Vitals   10/28/17 0014 10/28/17 0021 10/28/17 0044 10/28/17 0140  BP: 120/84     Pulse: 93     Resp: 20     Temp:      TempSrc:      SpO2: 94%     Weight:   162 lb (73.5 kg)   Height:   5' 4" (1.626 m)   PainSc:  9   9     Isolation Precautions No active isolations  Medications Medications  polyvinyl alcohol (LIQUIFILM TEARS) 1.4 % ophthalmic solution 2 drop (not administered)  insulin glargine (LANTUS) injection 10 Units (10 Units Subcutaneous Given 10/28/17 0131)  insulin aspart (novoLOG) injection 0-9 Units (0 Units Subcutaneous Refused 10/28/17 0153)  enoxaparin (LOVENOX) injection 40 mg (not administered)  0.9 %  sodium chloride infusion ( Intravenous New Bag/Given 10/28/17 0141)  acetaminophen (TYLENOL) tablet 650 mg (not administered)    Or  acetaminophen (TYLENOL) suppository 650 mg  (not administered)  senna-docusate (Senokot-S) tablet 1 tablet (not administered)  bisacodyl (DULCOLAX) EC tablet 5 mg (not administered)  ondansetron (ZOFRAN) tablet 4 mg (not administered)    Or  ondansetron (ZOFRAN) injection 4 mg (not administered)  albuterol (PROVENTIL) (2.5 MG/3ML) 0.083% nebulizer solution 2.5 mg (not administered)  budesonide (PULMICORT) nebulizer solution 0.25 mg (0.25 mg Nebulization Not Given 10/28/17 0147)  famotidine (PEPCID) IVPB 20 mg premix (not administered)  HYDROmorphone (DILAUDID) injection 1-2 mg (not administered)  0.9 %  sodium chloride infusion (0 mLs Intravenous Stopped 10/28/17 0024)  HYDROmorphone (DILAUDID) injection 1 mg (1 mg Intravenous Given 10/27/17 2115)  famotidine (PEPCID) IVPB 20 mg premix (0 mg Intravenous Stopped 10/27/17 2306)  HYDROmorphone (DILAUDID) injection 1 mg (1 mg Intravenous Given 10/27/17 2216)  HYDROmorphone (DILAUDID) injection 2 mg (2 mg Intravenous Given 10/27/17 2326)  HYDROmorphone (DILAUDID) injection 1 mg (1 mg Intravenous Given 10/28/17 0044)    Mobility walks

## 2017-10-28 NOTE — Progress Notes (Signed)
[facility] PROGRESS NOTE    Progress Note  Heather FlingRebekah Wagner  UUV:[id number] DOB: 10-May-1976 DOA: 10/27/2017 PCP: Maurice SmallGriffin, Elaine, MD     Brief Narrative:   Heather FlingRebekah Wagner is an 41 y.o. female past medical history of hereditary pancreatitis no significant elevation of her lipase status post Whipple procedure, insulin-dependent diabetes mellitus and asthma presenting to the emergency room for severe epigastric pain that started 3 days prior to admission in the ED she was found to be afebrile with a heart rate of 112 a white count of 20,000, microcytic anemia stable, chronic thrombocytosis of 449  Assessment/Plan:   Pancreatitis, acute hereditary: We will treat conservatively with IV fluids, bowel rest and narcotics. Continue MiraLAX for anticonstipation. Will start a PCA pump  Leucocytosis: Slowly improving with conservative management, likely reactive. She has remained afebrile.  Microcytic anemia Anemia panel was checked and her ferritin was n was 3, will give her IV iron.  Type 2 diabetes mellitus without complication, with long-term current use of insulin (HCC) A1c of 8.7 at home she is on Levemir 20+ sliding scale insulin. Good control on 10 units of Lantus plus sliding scale.    DVT prophylaxis: lovenox Family Communication:none Disposition Plan/Barrier to D/C: none Code Status:     Code Status Orders  (From admission, onward)        Start     Ordered   10/28/17 0037  Full code  Continuous     10/28/17 0039    Code Status History    Date Active Date Inactive Code Status Order ID Comments User Context   01/05/2016 05:05 01/07/2016 15:01 Full Code [id number]  Alberteen Samanford, Christopher P, MD Inpatient   06/18/2014 22:13 06/21/2014 18:28 Full Code [id number]  Hillary BowGardner, Jared M, DO ED   09/25/2013 07:03 09/27/2013 16:26 Full Code [id number]  Eduard ClosKakrakandy, Arshad N, MD Inpatient        IV Access:    Peripheral IV   Procedures and diagnostic studies:   No  results found.   Medical Consultants:    None.  Anti-Infectives:   none  Subjective:    Heather FlingRebekah Wagner rates her pain is not controlled, her last bowel movement was 48 hours prior to admission. Objective:    Vitals:   10/28/17 0200 10/28/17 0257 10/28/17 0539 10/28/17 0641  BP: 125/89 135/81 (!) 147/90 114/71  Pulse: 100 (!) 104 (!) 111 (!) 101  Resp: 18 18 18 18   Temp:  99.2 F (37.3 C) 100 F (37.8 C)   TempSrc:  Oral Oral   SpO2: 96% 100% 100% 100%  Weight:      Height:        Intake/Output Summary (Last 24 hours) at 10/28/2017 0932 Last data filed at 10/28/2017 0600 Gross per 24 hour  Intake 1757.5 ml  Output 700 ml  Net 1057.5 ml   Filed Weights   10/28/17 0044  Weight: 73.5 kg (162 lb)    Exam: General exam: In no acute distress. Respiratory system: Good air movement and clear to auscultation. Cardiovascular system: S1 & S2 heard, RRR.  Gastrointestinal system: Abdomen is nondistended, soft and nontender.  Central nervous system: Alert and oriented. No focal neurological deficits. Extremities: No pedal edema. Skin: No rashes, lesions or ulcers Psychiatry: Judgement and insight appear normal. Mood & affect appropriate.    Data Reviewed:    Labs: Basic Metabolic Panel: Recent Labs  Lab 10/27/17 2119 10/28/17 0125  NA 134* 134*  K 3.5 3.9  CL 99* 102  CO2 25 25  GLUCOSE 165* 150*  BUN 12 10  CREATININE 0.50 0.51  CALCIUM 8.9 8.4*   GFR Estimated Creatinine Clearance: 90.9 mL/min (by C-G formula based on SCr of 0.51 mg/dL). Liver Function Tests: Recent Labs  Lab 10/27/17 2119 10/28/17 0125  AST 21 21  ALT 21 20  ALKPHOS 81 77  BILITOT 0.5 0.9  PROT 7.5 7.1  ALBUMIN 3.8 3.5   Recent Labs  Lab 10/27/17 2119  LIPASE 34   No results for input(s): AMMONIA in the last 168 hours. Coagulation profile No results for input(s): INR, PROTIME in the last 168 hours.  CBC: Recent Labs  Lab 10/27/17 2119 10/28/17 0125  WBC  20.0* 17.6*  NEUTROABS 15.6* 13.6*  HGB 9.6* 9.6*  HCT 31.4* 30.7*  MCV 69.2* 68.1*  PLT 449* 419*   Cardiac Enzymes: No results for input(s): CKTOTAL, CKMB, CKMBINDEX, TROPONINI in the last 168 hours. BNP (last 3 results) No results for input(s): PROBNP in the last 8760 hours. CBG: Recent Labs  Lab 10/28/17 0137 10/28/17 0353 10/28/17 0753  GLUCAP 142* 138* 101*   D-Dimer: No results for input(s): DDIMER in the last 72 hours. Hgb A1c: No results for input(s): HGBA1C in the last 72 hours. Lipid Profile: No results for input(s): CHOL, HDL, LDLCALC, TRIG, CHOLHDL, LDLDIRECT in the last 72 hours. Thyroid function studies: No results for input(s): TSH, T4TOTAL, T3FREE, THYROIDAB in the last 72 hours.  Invalid input(s): FREET3 Anemia work up: Recent Labs    10/27/17 2119 10/28/17 0125 10/28/17 0130  VITAMINB12 434  --   --   FOLATE  --   --  21.5  FERRITIN 3*  --   --   TIBC 484*  --   --   IRON 17*  --   --   RETICCTPCT  --  1.3  --    Sepsis Labs: Recent Labs  Lab 10/27/17 2119 10/28/17 0125  WBC 20.0* 17.6*   Microbiology No results found for this or any previous visit (from the past 240 hour(s)).   Medications:   . budesonide (PULMICORT) nebulizer solution  0.25 mg Nebulization BID  . enoxaparin (LOVENOX) injection  40 mg Subcutaneous Q24H  . insulin aspart  0-9 Units Subcutaneous Q4H  . insulin glargine  10 Units Subcutaneous BID   Continuous Infusions: . sodium chloride 150 mL/hr at 10/28/17 [id number]  . famotidine (PEPCID) IV       LOS: 0 days   Marinda Elkbraham Feliz Ortiz  [facility] Pager [phone]  *Please refer to [email or web address], password [email or web address] to get updated schedule on who will round on this patient, as hospitalists switch teams weekly. If 7PM-7AM, please contact night-coverage at [email or web address], password [email or web address] for any overnight needs.  10/28/2017, 9:32 AM

## 2017-10-29 ENCOUNTER — Inpatient Hospital Stay (HOSPITAL_COMMUNITY): Payer: Managed Care, Other (non HMO)

## 2017-10-29 LAB — CBC
HEMATOCRIT: 26.9 % — AB (ref 36.0–46.0)
Hemoglobin: 8.1 g/dL — ABNORMAL LOW (ref 12.0–15.0)
MCH: 20.9 pg — AB (ref 26.0–34.0)
MCHC: 30.1 g/dL (ref 30.0–36.0)
MCV: 69.5 fL — AB (ref 78.0–100.0)
Platelets: 350 10*3/uL (ref 150–400)
RBC: 3.87 MIL/uL (ref 3.87–5.11)
RDW: 17.4 % — AB (ref 11.5–15.5)
WBC: 12.1 10*3/uL — AB (ref 4.0–10.5)

## 2017-10-29 LAB — GLUCOSE, CAPILLARY
GLUCOSE-CAPILLARY: 176 mg/dL — AB (ref 65–99)
GLUCOSE-CAPILLARY: 163 mg/dL — AB (ref 65–99)
GLUCOSE-CAPILLARY: 175 mg/dL — AB (ref 65–99)
GLUCOSE-CAPILLARY: 164 mg/dL — AB (ref 65–99)
Glucose-Capillary: 148 mg/dL — ABNORMAL HIGH (ref 65–99)
Glucose-Capillary: 162 mg/dL — ABNORMAL HIGH (ref 65–99)

## 2017-10-29 LAB — BASIC METABOLIC PANEL
Anion gap: 6 (ref 5–15)
CO2: 25 mmol/L (ref 22–32)
CREATININE: 0.53 mg/dL (ref 0.44–1.00)
Calcium: 8.3 mg/dL — ABNORMAL LOW (ref 8.9–10.3)
Chloride: 102 mmol/L (ref 101–111)
Glucose, Bld: 172 mg/dL — ABNORMAL HIGH (ref 65–99)
POTASSIUM: 3.8 mmol/L (ref 3.5–5.1)
SODIUM: 133 mmol/L — AB (ref 135–145)

## 2017-10-29 MED ORDER — IOPAMIDOL (ISOVUE-300) INJECTION 61%
INTRAVENOUS | Status: AC
  Filled 2017-10-29: qty 100

## 2017-10-29 MED ORDER — IOPAMIDOL (ISOVUE-300) INJECTION 61%
INTRAVENOUS | Status: AC
  Filled 2017-10-29: qty 30

## 2017-10-29 MED ORDER — IOPAMIDOL (ISOVUE-300) INJECTION 61%
100.0000 mL | Freq: Once | INTRAVENOUS | Status: AC | PRN
  Administered 2017-10-29: 100 mL via INTRAVENOUS

## 2017-10-29 MED ORDER — IOPAMIDOL (ISOVUE-300) INJECTION 61%
30.0000 mL | Freq: Once | INTRAVENOUS | Status: AC | PRN
  Administered 2017-10-29: 30 mL via ORAL

## 2017-10-29 NOTE — Progress Notes (Signed)
[facility] PROGRESS NOTE    Progress Note  Heather FlingRebekah Xie  [id number] DOB: May 09, 1976 DOA: 10/27/2017 PCP: Maurice SmallGriffin, Elaine, MD     Brief Narrative:   Heather Wagner is an 41 y.o. female past medical history of hereditary pancreatitis no significant elevation of her lipase status post Whipple procedure, insulin-dependent diabetes mellitus and asthma presenting to the emergency room for severe epigastric pain that started 3 days prior to admission in the ED she was found to be afebrile with a heart rate of 112 a white count of 20,000, microcytic anemia stable, chronic thrombocytosis of 449  Assessment/Plan:   Pancreatitis, acute hereditary: We will treat conservatively with IV fluids, bowel rest and narcotics. Continue MiraLAX for anticonstipation. Continue PCA pump and IV Dilaudid for breakthrough pain.  Leucocytosis: Improving with conservative management, likely reactive. She has remained afebrile.  Microcytic anemia Is given IV iron will need to recheck a CBC in 6 weeks.  Type 2 diabetes mellitus without complication, with long-term current use of insulin (HCC) A1c of 8.7 at home she is on Levemir 20+ sliding scale insulin. Good control on 10 units of Lantus plus sliding scale.  Thrombocytosis: Likely due to microcytic anemia and or acting as an acute phase reactant. Is now resolved.  DVT prophylaxis: lovenox Family Communication:none Disposition Plan/Barrier to D/C: none Code Status:     Code Status Orders  (From admission, onward)        Start     Ordered   10/28/17 0037  Full code  Continuous     10/28/17 0039    Code Status History    Date Active Date Inactive Code Status Order ID Comments User Context   01/05/2016 05:05 01/07/2016 15:01 Full Code [id number]  Alberteen Samanford, Christopher P, MD Inpatient   06/18/2014 22:13 06/21/2014 18:28 Full Code [id number]  Hillary BowGardner, Jared M, DO ED   09/25/2013 07:03 09/27/2013 16:26 Full Code [id number]  Eduard ClosKakrakandy,  Arshad N, MD Inpatient        IV Access:    Peripheral IV   Procedures and diagnostic studies:   No results found.   Medical Consultants:    None.  Anti-Infectives:   none  Subjective:    Heather FlingRebekah Kracke relates her pain is not controlled. Objective:    Vitals:   10/29/17 0433 10/29/17 0500 10/29/17 0608 10/29/17 0900  BP:   118/79 125/84  Pulse:   93 93  Resp: 12  13 12   Temp:   99.1 F (37.3 C) 98.3 F (36.8 C)  TempSrc:   Oral Oral  SpO2: 100%  98% 96%  Weight:  78.3 kg (172 lb 9 oz)    Height:        Intake/Output Summary (Last 24 hours) at 10/29/2017 0949 Last data filed at 10/29/2017 0900 Gross per 24 hour  Intake 2100 ml  Output 1600 ml  Net 500 ml   Filed Weights   10/28/17 0044 10/29/17 0500  Weight: 73.5 kg (162 lb) 78.3 kg (172 lb 9 oz)    Exam: General exam: In no acute distress. Respiratory system: Good air movement and clear to auscultation. Cardiovascular system: S1 & S2 heard, RRR.  Gastrointestinal system: Abdomen is nondistended, soft and epigastric tenderness.  Central nervous system: Alert and oriented. No focal neurological deficits. Extremities: No pedal edema. Skin: No rashes, lesions or ulcers Psychiatry: Judgement and insight appear normal. Mood & affect appropriate.    Data Reviewed:    Labs: Basic Metabolic Panel: Recent Labs  Lab 10/27/17 2119 10/28/17 0125  10/29/17 0503  NA 134* 134* 133*  K 3.5 3.9 3.8  CL 99* 102 102  CO2 25 25 25   GLUCOSE 165* 150* 172*  BUN 12 10 <5*  CREATININE 0.50 0.51 0.53  CALCIUM 8.9 8.4* 8.3*   GFR Estimated Creatinine Clearance: 93.6 mL/min (by C-G formula based on SCr of 0.53 mg/dL). Liver Function Tests: Recent Labs  Lab 10/27/17 2119 10/28/17 0125  AST 21 21  ALT 21 20  ALKPHOS 81 77  BILITOT 0.5 0.9  PROT 7.5 7.1  ALBUMIN 3.8 3.5   Recent Labs  Lab 10/27/17 2119  LIPASE 34   No results for input(s): AMMONIA in the last 168 hours. Coagulation  profile No results for input(s): INR, PROTIME in the last 168 hours.  CBC: Recent Labs  Lab 10/27/17 2119 10/28/17 0125 10/29/17 0503  WBC 20.0* 17.6* 12.1*  NEUTROABS 15.6* 13.6*  --   HGB 9.6* 9.6* 8.1*  HCT 31.4* 30.7* 26.9*  MCV 69.2* 68.1* 69.5*  PLT 449* 419* 350   Cardiac Enzymes: No results for input(s): CKTOTAL, CKMB, CKMBINDEX, TROPONINI in the last 168 hours. BNP (last 3 results) No results for input(s): PROBNP in the last 8760 hours. CBG: Recent Labs  Lab 10/28/17 1530 10/28/17 2129 10/29/17 0131 10/29/17 0359 10/29/17 0741  GLUCAP 85 146* 148* 162* 176*   D-Dimer: No results for input(s): DDIMER in the last 72 hours. Hgb A1c: No results for input(s): HGBA1C in the last 72 hours. Lipid Profile: No results for input(s): CHOL, HDL, LDLCALC, TRIG, CHOLHDL, LDLDIRECT in the last 72 hours. Thyroid function studies: No results for input(s): TSH, T4TOTAL, T3FREE, THYROIDAB in the last 72 hours.  Invalid input(s): FREET3 Anemia work up: Recent Labs    10/27/17 2119 10/28/17 0125 10/28/17 0130  VITAMINB12 434  --   --   FOLATE  --   --  21.5  FERRITIN 3*  --   --   TIBC 484*  --   --   IRON 17*  --   --   RETICCTPCT  --  1.3  --    Sepsis Labs: Recent Labs  Lab 10/27/17 2119 10/28/17 0125 10/29/17 0503  WBC 20.0* 17.6* 12.1*   Microbiology No results found for this or any previous visit (from the past 240 hour(s)).   Medications:   . budesonide (PULMICORT) nebulizer solution  0.25 mg Nebulization BID  . enoxaparin (LOVENOX) injection  40 mg Subcutaneous Q24H  . HYDROmorphone   Intravenous Q4H  . insulin aspart  0-9 Units Subcutaneous Q4H  . insulin glargine  5 Units Subcutaneous BID  . polyethylene glycol  17 g Oral Daily   Continuous Infusions: . dextrose 5 % and 0.45% NaCl 1,000 mL with potassium chloride 20 mEq infusion 100 mL/hr at 10/29/17 0310  . famotidine (PEPCID) IV Stopped (10/28/17 2319)     LOS: 1 day   Marinda ElkAbraham Feliz  Ortiz  [facility] Pager [phone]  *Please refer to [email or web address], password [email or web address] to get updated schedule on who will round on this patient, as hospitalists switch teams weekly. If 7PM-7AM, please contact night-coverage at [email or web address], password [email or web address] for any overnight needs.  10/29/2017, 9:49 AM

## 2017-10-29 NOTE — Progress Notes (Signed)
Pt vomited  Large amt oral CT contrast--states she just isnt going to be able to take oral contrast today. MD notified . Instructed to proceed with CT using iv contrast. CT personal notified.

## 2017-10-30 DIAGNOSIS — K859 Acute pancreatitis without necrosis or infection, unspecified: Principal | ICD-10-CM

## 2017-10-30 DIAGNOSIS — K861 Other chronic pancreatitis: Secondary | ICD-10-CM

## 2017-10-30 DIAGNOSIS — R52 Pain, unspecified: Secondary | ICD-10-CM

## 2017-10-30 LAB — BASIC METABOLIC PANEL
Anion gap: 6 (ref 5–15)
CHLORIDE: 104 mmol/L (ref 101–111)
CO2: 24 mmol/L (ref 22–32)
CREATININE: 0.5 mg/dL (ref 0.44–1.00)
Calcium: 8.7 mg/dL — ABNORMAL LOW (ref 8.9–10.3)
GFR calc Af Amer: >60 mL/min (ref >60)
GFR calc non Af Amer: >60 mL/min (ref >60)
Glucose, Bld: 165 mg/dL — ABNORMAL HIGH (ref 65–99)
Potassium: 4.2 mmol/L (ref 3.5–5.1)
Sodium: 134 mmol/L — ABNORMAL LOW (ref 135–145)

## 2017-10-30 LAB — GLUCOSE, CAPILLARY
GLUCOSE-CAPILLARY: 152 mg/dL — AB (ref 65–99)
GLUCOSE-CAPILLARY: 241 mg/dL — AB (ref 65–99)
Glucose-Capillary: 156 mg/dL — ABNORMAL HIGH (ref 65–99)
Glucose-Capillary: 157 mg/dL — ABNORMAL HIGH (ref 65–99)

## 2017-10-30 MED ORDER — HYDROMORPHONE HCL 1 MG/ML IJ SOLN: 1.0000 mg | INTRAMUSCULAR | Status: DC | PRN

## 2017-10-30 MED ORDER — HYDROCODONE-ACETAMINOPHEN 5-325 MG PO TABS
1.0000 | ORAL_TABLET | ORAL | Status: DC | PRN
  Administered 2017-10-30: 2 via ORAL
  Filled 2017-10-30: qty 2

## 2017-10-30 NOTE — Progress Notes (Signed)
Pt tolerating diet, ambulating, says pain has decreased.  D/C instructions were given.  All questions answered. Pt was d/cd home.

## 2017-10-30 NOTE — Discharge Summary (Signed)
Physician Discharge Summary  Heather FlingRebekah Wagner VHQ:[id number] DOB: [birth year] DOA: 10/27/2017  PCP: Maurice SmallGriffin, Elaine, MD  Admit date: 10/27/2017 Discharge date: 10/30/2017  Admitted From:home Disposition:home  Recommendations for Outpatient Follow-up:  1. Follow up with PCP in 1-2 weeks 2. Please obtain BMP/CBC in one week   Home Health:no Equipment/Devices:no Discharge Condition:stable CODE STATUS:full code Diet recommendation:carb modified  Brief/Interim Summary: 41 year old female with history of peritoneal pancreatitis status post Whipple surgery, insulin-dependent diabetes mellitus, asthma presented to the ER with severe epigastric pain for about 3 days prior to admission.  CT scan consistent with uncomplicated pancreatitis involving the residual pancreatic tail and body.  Patient symptoms contributed by acute on chronic pancreatitis.  Patient was treated symptomatically with IV fluids, Dilaudid PCA and supportive care.  Patient reported pain is controlled and wanted to try oral medication.  She tolerated diet well without difficulties.  Patient reported that she feels fine and wanted to go home today because if she stays in the hospital longer she will have migraine plan worsening headache.  Patient reported that she had similar problem in the past and when she goes home she feels better.  Patient reported that she has oral pain medication at home.  She follows up with PCP.  She is able to ambulate without difficulties.  At this time patient is medically stable to discharge home with outpatient follow-up.  Discharge Diagnoses:  Principal Problem:   Pancreatitis, acute Active Problems:   Leucocytosis   Microcytic anemia   Asthma   Type 2 diabetes mellitus without complication, with long-term current use of insulin (HCC)   Hereditary pancreatitis s/p Whipple   Acute on chronic pancreatitis [facility](HCC)    Discharge Instructions  Discharge Instructions    Call MD for:  difficulty  breathing, headache or visual disturbances   Complete by:  As directed    Call MD for:  extreme fatigue   Complete by:  As directed    Call MD for:  hives   Complete by:  As directed    Call MD for:  persistant dizziness or light-headedness   Complete by:  As directed    Call MD for:  persistant nausea and vomiting   Complete by:  As directed    Call MD for:  severe uncontrolled pain   Complete by:  As directed    Call MD for:  temperature >100.4   Complete by:  As directed    Diet Carb Modified   Complete by:  As directed    Increase activity slowly   Complete by:  As directed      Allergies as of 10/30/2017      Reactions   Ciprofloxacin Hcl Other (See Comments)   Flare up of pancreatitis, severe yeast infection    Nortriptyline Hcl Shortness Of Breath   Swelling anxiety   Amitriptyline Other (See Comments)   sedation   Compazine [prochlorperazine Maleate] Other (See Comments)   Neurological - uncontrollable eye movements   Cymbalta [duloxetine Hcl] Nausea And Vomiting   insomnia   Dexilant [dexlansoprazole] Nausea Only   Doxycycline Nausea And Vomiting   Flagyl [metronidazole Hcl] Nausea And Vomiting   Skin rash   Gabapentin Nausea And Vomiting, Other (See Comments)   Agitation   Penicillins    Penicillins Cross Reactors Nausea And Vomiting   Skin rash/hives   Septra [sulfamethoxazole-trimethoprim] Other (See Comments)   Severe upset stomach, nausea, stomach pain, over heating.    Tizanidine Nausea And Vomiting, Other (See Comments)   Agitation  Topamax [topiramate] Other (See Comments)   Mood change, anxiety, headaches   Toradol [ketorolac Tromethamine] Other (See Comments)   Anxiety.   Amoxicillin Nausea And Vomiting, Anxiety   Has patient had a PCN reaction causing immediate rash, facial/tongue/throat swelling, SOB or lightheadedness with hypotension:  Has patient had a PCN reaction causing severe rash involving mucus membranes or skin necrosis:  Has patient  had a PCN reaction that required hospitalization  Has patient had a PCN reaction occurring within the last 10 years:  If all of the above answers are "NO", then may proceed with Cephalosporin use.   Diflucan [fluconazole] Diarrhea, Palpitations   Stomach pain, pancreatitis flare up, severe headache    Inapsine [droperidol] Nausea And Vomiting   Ketamine Anxiety   Agitation, insomnia   Levofloxacin Palpitations, Other (See Comments)   Light headed, anxiety, blood rushing, shaking, sweating.l    Lyrica [pregabalin] Other (See Comments)   Mood changes- become listless, disinterested   Phenergan [promethazine Hcl] Anxiety   Phenothiazines Hives, Nausea And Vomiting, Rash   Primaxin [imipenem] Nausea And Vomiting   Prozac [fluoxetine Hcl] Anxiety   Reglan [metoclopramide Hcl] Anxiety      Medication List    TAKE these medications   albuterol 108 (90 Base) MCG/ACT inhaler Commonly known as:  PROVENTIL HFA;VENTOLIN HFA Inhale 1-2 puffs into the lungs every 6 (six) hours as needed for wheezing. Asthma   ALIVE WOMENS GUMMY PO Take 2 tablets by mouth daily after breakfast.   BASAGLAR KWIKPEN 100 UNIT/ML Sopn Inject 21 Units into the skin 2 (two) times daily.   BAYER CONTOUR NEXT TEST test strip Generic drug:  glucose blood USE TO CHECK BLOOD SUGAR 4X A DAY (B08.9)   beclomethasone 80 MCG/ACT inhaler Commonly known as:  QVAR REDIHALER Inhale 2 puffs into the lungs daily.   calcium carbonate 750 MG chewable tablet Commonly known as:  TUMS EX Chew 2-4 tablets by mouth 2 (two) times daily as needed for heartburn. No more than 10 a day   carboxymethylcellulose 0.5 % Soln Commonly known as:  REFRESH PLUS 1-2 drops every 2 (two) hours as needed for Dry eyes   diclofenac sodium 1 % Gel Commonly known as:  VOLTAREN APPLY 3 GRAMS TO 3 LARGE JOINTS 3 TIMES DAILY AS NEEDED FOR PAIN   HYDROcodone-acetaminophen 5-325 MG tablet Commonly known as:  NORCO/VICODIN Take 1-2 tablets by mouth  every 6 (six) hours as needed for moderate pain.   ibuprofen 200 MG tablet Commonly known as:  ADVIL,MOTRIN Take 400-800 mg by mouth every 8 (eight) hours as needed for moderate pain.   insulin aspart 100 UNIT/ML injection Commonly known as:  novoLOG Inject 1-5 Units into the skin. 5-6 times daily with meals and extra if needed, injecting 1 unit for every 15 carbs, 1 unit every 50 over 150.   Magnesium 300 MG Caps Take 300 mg by mouth every evening.   Melatonin 5 MG Tabs Take 1 tablet by mouth at bedtime.   MICROLET LANCETS Misc AS DIRECTED FOUR TIMES A DAY 30 DAYS   omeprazole 20 MG capsule Commonly known as:  PRILOSEC Take 20 mg by mouth every morning.   OVER THE COUNTER MEDICATION Take 1 Dose by mouth at bedtime as needed (insomnia). Valarian/liqourice root tea   polyethylene glycol packet Commonly known as:  MIRALAX / GLYCOLAX Take 17 g by mouth daily as needed for mild constipation.   PROBIOTIC DAILY PO Take 1 capsule by mouth daily. Reported on 01/16/2016  rizatriptan 10 MG tablet Commonly known as:  MAXALT Take 10 mg by mouth as needed for migraine. May repeat in 2 hours if needed for headaches   ZENPEP 25000 units Cpep Generic drug:  Pancrelipase (Lip-Prot-Amyl) Take 1-3 capsules by mouth 3 (three) times daily with meals as needed. 3-4 caps per meal and 1-2 caps per snack      Follow-up Information    Maurice SmallGriffin, Elaine, MD. Schedule an appointment as soon as possible for a visit in 1 week(s).   Specialty:  Family Medicine Contact information: [address] [place] [address] [phone]          Allergies  Allergen Reactions  . Ciprofloxacin Hcl Other (See Comments)    Flare up of pancreatitis, severe yeast infection   . Nortriptyline Hcl Shortness Of Breath    Swelling anxiety  . Amitriptyline Other (See Comments)    sedation  . Compazine [Prochlorperazine Maleate] Other (See Comments)    Neurological - uncontrollable eye  movements  . Cymbalta [Duloxetine Hcl] Nausea And Vomiting    insomnia  . Dexilant [Dexlansoprazole] Nausea Only  . Doxycycline Nausea And Vomiting  . Flagyl [Metronidazole Hcl] Nausea And Vomiting    Skin rash  . Gabapentin Nausea And Vomiting and Other (See Comments)    Agitation  . Penicillins   . Penicillins Cross Reactors Nausea And Vomiting    Skin rash/hives  . Septra [Sulfamethoxazole-Trimethoprim] Other (See Comments)    Severe upset stomach, nausea, stomach pain, over heating.   . Tizanidine Nausea And Vomiting and Other (See Comments)    Agitation  . Topamax [Topiramate] Other (See Comments)    Mood change, anxiety, headaches  . Toradol [Ketorolac Tromethamine] Other (See Comments)    Anxiety.  . Amoxicillin Nausea And Vomiting and Anxiety    Has patient had a PCN reaction causing immediate rash, facial/tongue/throat swelling, SOB or lightheadedness with hypotension:  Has patient had a PCN reaction causing severe rash involving mucus membranes or skin necrosis:  Has patient had a PCN reaction that required hospitalization  Has patient had a PCN reaction occurring within the last 10 years:  If all of the above answers are "NO", then may proceed with Cephalosporin use.   . Diflucan [Fluconazole] Diarrhea and Palpitations    Stomach pain, pancreatitis flare up, severe headache   . Inapsine [Droperidol] Nausea And Vomiting  . Ketamine Anxiety    Agitation, insomnia   . Levofloxacin Palpitations and Other (See Comments)    Light headed, anxiety, blood rushing, shaking, sweating.l   . Lyrica [Pregabalin] Other (See Comments)    Mood changes- become listless, disinterested  . Phenergan [Promethazine Hcl] Anxiety  . Phenothiazines Hives, Nausea And Vomiting and Rash  . Primaxin [Imipenem] Nausea And Vomiting  . Prozac [Fluoxetine Hcl] Anxiety  . Reglan [Metoclopramide Hcl] Anxiety    Consultations: None  Procedures/Studies: None  Subjective: Seen and examined at  bedside.  Denies headache, dizziness, nausea, vomiting, exertion, shortness of breath.  Pain is controlled and eager to eat.  Wanted to go home today.  Discharge Exam: Vitals:   10/30/17 0815 10/30/17 0930  BP:  126/88  Pulse:  77  Resp: 12 15  Temp:  98.6 F (37 C)  SpO2:  100%   Vitals:   10/30/17 0430 10/30/17 0450 10/30/17 0815 10/30/17 0930  BP: 114/76   126/88  Pulse: 76   77  Resp: 10 12 12 15   Temp: 98.3 F (36.8 C)   98.6 F (  37 C)  TempSrc: Oral   Oral  SpO2: 100% 100%  100%  Weight:      Height:        General: Pt is alert, awake, not in acute distress Cardiovascular: RRR, S1/S2 +, no rubs, no gallops Respiratory: CTA bilaterally, no wheezing, no rhonchi Abdominal: Soft, NT, ND, bowel sounds + Extremities: no edema, no cyanosis    The results of significant diagnostics from this hospitalization (including imaging, microbiology, ancillary and laboratory) are listed below for reference.     Microbiology: No results found for this or any previous visit (from the past 240 hour(s)).   Labs: BNP (last 3 results) No results for input(s): BNP in the last 8760 hours. Basic Metabolic Panel: Recent Labs  Lab 10/27/17 2119 10/28/17 0125 10/29/17 0503 10/30/17 0526  NA 134* 134* 133* 134*  K 3.5 3.9 3.8 4.2  CL 99* 102 102 104  CO2 25 25 25 24   GLUCOSE 165* 150* 172* 165*  BUN 12 10 <5* <5*  CREATININE 0.50 0.51 0.53 0.50  CALCIUM 8.9 8.4* 8.3* 8.7*   Liver Function Tests: Recent Labs  Lab 10/27/17 2119 10/28/17 0125  AST 21 21  ALT 21 20  ALKPHOS 81 77  BILITOT 0.5 0.9  PROT 7.5 7.1  ALBUMIN 3.8 3.5   Recent Labs  Lab 10/27/17 2119  LIPASE 34   No results for input(s): AMMONIA in the last 168 hours. CBC: Recent Labs  Lab 10/27/17 2119 10/28/17 0125 10/29/17 0503  WBC 20.0* 17.6* 12.1*  NEUTROABS 15.6* 13.6*  --   HGB 9.6* 9.6* 8.1*  HCT 31.4* 30.7* 26.9*  MCV 69.2* 68.1* 69.5*  PLT 449* 419* 350   Cardiac Enzymes: No results  for input(s): CKTOTAL, CKMB, CKMBINDEX, TROPONINI in the last 168 hours. BNP: Invalid input(s): POCBNP CBG: Recent Labs  Lab 10/29/17 1942 10/30/17 0002 10/30/17 0425 10/30/17 0745 10/30/17 1149  GLUCAP 164* 157* 152* 156* 241*   D-Dimer No results for input(s): DDIMER in the last 72 hours. Hgb A1c No results for input(s): HGBA1C in the last 72 hours. Lipid Profile No results for input(s): CHOL, HDL, LDLCALC, TRIG, CHOLHDL, LDLDIRECT in the last 72 hours. Thyroid function studies No results for input(s): TSH, T4TOTAL, T3FREE, THYROIDAB in the last 72 hours.  Invalid input(s): FREET3 Anemia work up Recent Labs    10/27/17 2119 10/28/17 0125 10/28/17 0130  VITAMINB12 434  --   --   FOLATE  --   --  21.5  FERRITIN 3*  --   --   TIBC 484*  --   --   IRON 17*  --   --   RETICCTPCT  --  1.3  --    Urinalysis    Component Value Date/Time   COLORURINE YELLOW 10/27/2017 2042   APPEARANCEUR CLEAR 10/27/2017 2042   LABSPEC 1.025 10/27/2017 2042   PHURINE 7.0 10/27/2017 2042   GLUCOSEU NEGATIVE 10/27/2017 2042   HGBUR NEGATIVE 10/27/2017 2042   BILIRUBINUR NEGATIVE 10/27/2017 2042   KETONESUR NEGATIVE 10/27/2017 2042   PROTEINUR NEGATIVE 10/27/2017 2042   UROBILINOGEN 0.2 06/29/2015 0021   NITRITE NEGATIVE 10/27/2017 2042   LEUKOCYTESUR NEGATIVE 10/27/2017 2042   Sepsis Labs Invalid input(s): PROCALCITONIN,  WBC,  LACTICIDVEN Microbiology No results found for this or any previous visit (from the past 240 hour(s)).   Time coordinating discharge:  30 minutes  SIGNED:   Maxie Barb, MD  [facility] 10/30/2017, 12:41 PM  If 7PM-7AM, please contact night-coverage [email or web address] Password  [email or web address]

## 2017-10-30 NOTE — Progress Notes (Signed)
Wasted 6 mg of a PCA Dilaudid syringe.  Sharman CrateMaura, RN was my witness. I could not find it in the pyxis.

## 2017-12-04 NOTE — Progress Notes (Signed)
Office Visit Note  Patient: Heather Wagner             Date of Birth: 10/28/1976           MRN: [id number]             PCP: Maurice SmallGriffin, Elaine, MD Referring: Joaquim LaiShamleffer, Ibethal Jar* Visit Date: 12/18/2017 Occupation: @GUAROCC @    Subjective:  Generalized pain    History of Present Illness: Heather Wagner is a 42 y.o. female with a history of fibromyalgia, osteoarthritis, and DDD.  Patient states since summer 2018 she has been having increased generalized pain and fatigue.  She states her skin has been hypersensitive and her tender points have been causing more pain.  She states she has been trying to eat an anti-inflammatory diet and drinking more water.  She states her rosacea and eczema have been flaring.  She states she continues to have trapezius muscle tension and tenderness.  She states her trochanteric bursitis continues to cause discomfort.  She does daily stretching and alternates heat and ice.  She has also been having massages once weekly.  She has increased her aerobic exercise as well.  She states her lower back muscles occasionally spasm.  She uses Biofreeze and Voltaren gel on her back.  She states she continues to have dry mouth, nasal dryness, and eye dryness.  She uses OTC products which help.  She states occasionally her left knee will cause discomfort, but she denies any swelling.  She states her pain is worse in the morning and she ranks it a 8 out of 10.    She states that she experienced left wrist pain a few weeks ago in the place that she previously had a ganglion cyst aspirated.  She has not noticed the cyst developing again and the pain has improved. She reports that she was hospitalized for pancreatitis from November 25-28.     Activities of Daily Living:  Patient reports morning stiffness for 1-2 hour.   Patient Reports nocturnal pain.  Difficulty dressing/grooming: Denies Difficulty climbing stairs: Reports Difficulty getting out of chair:  Reports Difficulty using hands for taps, buttons, cutlery, and/or writing: Reports   Review of Systems  Constitutional: Positive for fatigue. Negative for weakness.  HENT: Positive for mouth dryness and nose dryness. Negative for mouth sores.   Eyes: Positive for dryness. Negative for redness.  Respiratory: Negative for cough, hemoptysis, shortness of breath and difficulty breathing.   Cardiovascular: Positive for palpitations. Negative for chest pain, hypertension, irregular heartbeat and swelling in legs/feet.  Gastrointestinal: Positive for constipation (IBS) and diarrhea. Negative for blood in stool.  Endocrine: Negative for increased urination.  Genitourinary: Negative for painful urination.  Musculoskeletal: Positive for arthralgias, joint pain, myalgias, morning stiffness, muscle tenderness and myalgias. Negative for joint swelling and muscle weakness.  Skin: Positive for rash (Eczema ). Negative for color change, pallor, hair loss, nodules/bumps, redness, skin tightness, ulcers and sensitivity to sunlight.  Neurological: Negative for dizziness, numbness and headaches.  Hematological: Negative for swollen glands.  Psychiatric/Behavioral: Positive for sleep disturbance. Negative for depressed mood. The patient is nervous/anxious.     PMFS History:  Patient Active Problem List   Diagnosis Date Noted  . Intractable pain   . Acute on chronic pancreatitis (HCC) 10/28/2017  . DDD (degenerative disc disease), cervical 07/12/2017  . DDD (degenerative disc disease), lumbar 07/12/2017  . Primary insomnia 05/30/2017  . Other fatigue 05/30/2017  . ANA positive 04/19/2017  . History of chronic sinusitis 04/19/2017  .  Fibromyalgia 10/24/2016  . Osteoarthritis of both knees 10/24/2016  . Sjogren's syndrome (HCC) 10/24/2016  . IBS (irritable bowel syndrome) 10/24/2016  . Migraines 10/24/2016  . Chronic sinusitis 10/24/2016  . Pelvic floor dysfunction 10/24/2016  . Vulvar vestibulitis  10/24/2016  . Dehydration 07/11/2016  . Fatty liver 07/11/2016  . Elevated LFTs 07/11/2016  . 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 Abdominal pain 01/05/2016  . Pancreatitis, acute   . CAP (community acquired pneumonia) 06/19/2014  . Pain management 06/18/2014  . Chronic pancreatitis (HCC) 06/18/2014  . Intractable migraine without aura 01/25/2014  . Acute pancreatitis 09/25/2013  . Leucocytosis 09/25/2013  . Microcytic anemia 09/25/2013  . Asthma 09/25/2013  . Type 2 diabetes mellitus without complication, with long-term current use of insulin (HCC) 09/25/2013  . Hyponatremia 09/25/2013    Past Medical History:  Diagnosis Date  . Asthma   . Chronic sinusitis   . Diabetes mellitus   . Fibromyalgia   . Hyperlipidemia   . IBS (irritable bowel syndrome)   . Migraine without aura, with intractable migraine, so stated, without mention of status migrainosus 01/25/2014  . Migraines   . Osteoarthritis of both knees 10/24/2016   moderate  . Pancreatitis chronic   . Pelvic floor dysfunction 10/24/2016  . Sjogren's syndrome (HCC)   . Vulvar vestibulitis     Family History  Problem Relation Age of Onset  . Asthma Mother   . High Cholesterol Father   . Macular degeneration Father   . Migraines Sister   . Endometriosis Sister   . Asthma Brother   . GER disease Brother   . Pancreatitis Paternal Grandfather   . Pancreatic cancer Other   . Pancreatitis Maternal Uncle    Past Surgical History:  Procedure Laterality Date  . BREAST REDUCTION SURGERY  2010  . DILATION AND CURETTAGE OF UTERUS  2007  . ERCP     Removed stone from Bile duct  . ERCP     with MRI  . PANCREAS SURGERY  1997  . whiple  1997  . whipple's     Social History   Social History Narrative  . Not on file     Objective: Vital Signs: BP 122/76 (BP Location: Left Arm, Patient Position: Sitting, Cuff Size: Normal)   Pulse 89   Resp 16   Ht 5\' 4"  (1.626 m)   Wt 167 lb (75.8 kg)   BMI 28.67  kg/m    Physical Exam  Constitutional: She is oriented to person, place, and time. She appears well-developed and well-nourished.  HENT:  Head: Normocephalic and atraumatic.  Eyes: Conjunctivae and EOM are normal.  Neck: Normal range of motion.  Cardiovascular: Normal rate, regular rhythm, normal heart sounds and intact distal pulses.  Pulmonary/Chest: Effort normal and breath sounds normal.  Abdominal: Soft. Bowel sounds are normal.  Lymphadenopathy:    She has no cervical adenopathy.  Neurological: She is alert and oriented to person, place, and time.  Skin: Skin is warm and dry. Capillary refill takes less than 2 seconds.  Psychiatric: She has a normal mood and affect. Her behavior is normal.  Nursing note and vitals reviewed.    Musculoskeletal Exam: C-spine, thoracic, and lumbar good ROM.  Shoulder joints, elbow joints, and wrist joints good ROM.  MCPs, PIPs, and DIPs good ROM with no synovitis.  Hip joints, knee joints, ankle joints, MTPs, PIPs, and DIPs good ROM with no synovitis.  She has bilateral knee crepitus.  No warmth or effusion.  No  midline spinal tenderness.  Generalized hyperalgesia on exam.   CDAI Exam: No CDAI exam completed.    Investigation: No additional findings. CBC Latest Ref Rng & Units 10/29/2017 10/28/2017 10/27/2017  WBC 4.0 - 10.5 K/uL 12.1(H) 17.6(H) 20.0(H)  Hemoglobin 12.0 - 15.0 g/dL 8.1(L) 9.6(L) 9.6(L)  Hematocrit 36.0 - 46.0 % 26.9(L) 30.7(L) 31.4(L)  Platelets 150 - 400 K/uL 350 419(H) 449(H)   CMP Latest Ref Rng & Units 10/30/2017 10/29/2017 10/28/2017  Glucose 65 - 99 mg/dL 147(W) 295(A) 213(Y)  BUN 6 - 20 mg/dL <8(M) <5(H) 10  Creatinine 0.44 - 1.00 mg/dL 8.46 9.62 9.52  Sodium 135 - 145 mmol/L 134(L) 133(L) 134(L)  Potassium 3.5 - 5.1 mmol/L 4.2 3.8 3.9  Chloride 101 - 111 mmol/L 104 102 102  CO2 22 - 32 mmol/L 24 25 25   Calcium 8.9 - 10.3 mg/dL 8.4(X) 8.3(L) 8.4(L)  Total Protein 6.5 - 8.1 g/dL - - 7.1  Total Bilirubin 0.3 - 1.2  mg/dL - - 0.9  Alkaline Phos 38 - 126 U/L - - 77  AST 15 - 41 U/L - - 21  ALT 14 - 54 U/L - - 20    Imaging: No results found.  Speciality Comments: No specialty comments available.    Procedures:  No procedures performed Allergies: Ciprofloxacin hcl; Nortriptyline hcl; Amitriptyline; Compazine [prochlorperazine maleate]; Cymbalta [duloxetine hcl]; Dexilant [dexlansoprazole]; Doxycycline; Flagyl [metronidazole hcl]; Gabapentin; Penicillins; Penicillins cross reactors; Septra [sulfamethoxazole-trimethoprim]; Tizanidine; Topamax [topiramate]; Toradol [ketorolac tromethamine]; Zofran [ondansetron hcl]; Amoxicillin; Diflucan [fluconazole]; Inapsine [droperidol]; Ketamine; Levofloxacin; Lyrica [pregabalin]; Phenergan [promethazine hcl]; Phenothiazines; Primaxin [imipenem]; Prozac [fluoxetine hcl]; and Reglan [metoclopramide hcl]   Assessment / Plan:     Visit Diagnoses: Fibromyalgia: She has been having a fibromyalgia flare for several months.  She has increased generalized pain and fatigue.  She is trying a antiinflammatory diet.  She has also been exercising more and stretching daily.  She goes to massage therapy once a week.  We discussed trying water aerobics as well.  She was given a refill of Voltaren gel.  She will continue alternating heat and ice for muscle tension.    Primary insomnia: Chronic. She takes Melatonin 5 mg at bedtime.    Other fatigue: She has had increased fatigue related to insomnia.  Encouraged her to continue to exercise on a regular basis.    Left CMC joint pain: She was given a prescription for a left CMC brace.  A handout of hand exercises was provided to the patient.    Primary osteoarthritis of both knees: Crepitus of left knee.  No warmth or effusion.    Sjogren's syndrome with other organ involvement (HCC) - Negative ANA, positive SSB: She uses over-the-counter products, which helps.  DDD (degenerative disc disease), cervical: Chronic pain.   DDD  (degenerative disc disease), lumbar: Chronic pain   Greater trochanteric bursitis of both hips: She continues to have point tenderness of bilateral trochanteric bursa.  She performs daily stretches.  She also uses biofreeze.  She was given a refill for voltaren gel.    Other medical conditions are listed as follows:   Pelvic floor dysfunction  History of migraine  History of diabetes mellitus  History of pancreatitis - Hereditary, followed up at [facility].  Hospitalized November 25-28th.   History of IBS  Fatty liver    Orders: No orders of the defined types were placed in this encounter.  Meds ordered this encounter  Medications  . diclofenac sodium (VOLTAREN) 1 % GEL    Sig:  Apply 3 grams to three large joints up to three times daily as needed    Dispense:  3 Tube    Refill:  3    Face-to-face time spent with patient was 30 minutes. Greater than 50% of time was spent in counseling and coordination of care.  Follow-Up Instructions: Return in about 6 months (around 06/17/2018) for Fibromyalgia, Osteoarthritis.   Pollyann Savoy, MD  Note - This record has been created using Animal nutritionist.  Chart creation errors have been sought, but may not always  have been located. Such creation errors do not reflect on  the standard of medical care.

## 2017-12-18 ENCOUNTER — Ambulatory Visit: Payer: Managed Care, Other (non HMO) | Admitting: Rheumatology

## 2017-12-18 ENCOUNTER — Encounter: Payer: Self-pay | Admitting: Rheumatology

## 2017-12-18 VITALS — BP 122/76 | HR 89 | Resp 16 | Ht 64.0 in | Wt 167.0 lb

## 2017-12-18 DIAGNOSIS — M3509 Sicca syndrome with other organ involvement: Secondary | ICD-10-CM

## 2017-12-18 DIAGNOSIS — M170 Bilateral primary osteoarthritis of knee: Secondary | ICD-10-CM

## 2017-12-18 DIAGNOSIS — F5101 Primary insomnia: Secondary | ICD-10-CM

## 2017-12-18 DIAGNOSIS — Z8719 Personal history of other diseases of the digestive system: Secondary | ICD-10-CM

## 2017-12-18 DIAGNOSIS — M7061 Trochanteric bursitis, right hip: Secondary | ICD-10-CM

## 2017-12-18 DIAGNOSIS — M5030 Other cervical disc degeneration, unspecified cervical region: Secondary | ICD-10-CM

## 2017-12-18 DIAGNOSIS — M5136 Other intervertebral disc degeneration, lumbar region: Secondary | ICD-10-CM | POA: Diagnosis not present

## 2017-12-18 DIAGNOSIS — Z8669 Personal history of other diseases of the nervous system and sense organs: Secondary | ICD-10-CM | POA: Diagnosis not present

## 2017-12-18 DIAGNOSIS — M797 Fibromyalgia: Secondary | ICD-10-CM

## 2017-12-18 DIAGNOSIS — Z8639 Personal history of other endocrine, nutritional and metabolic disease: Secondary | ICD-10-CM

## 2017-12-18 DIAGNOSIS — R5383 Other fatigue: Secondary | ICD-10-CM | POA: Diagnosis not present

## 2017-12-18 DIAGNOSIS — K760 Fatty (change of) liver, not elsewhere classified: Secondary | ICD-10-CM

## 2017-12-18 DIAGNOSIS — M6289 Other specified disorders of muscle: Secondary | ICD-10-CM | POA: Diagnosis not present

## 2017-12-18 DIAGNOSIS — M7062 Trochanteric bursitis, left hip: Secondary | ICD-10-CM

## 2017-12-18 DIAGNOSIS — M79645 Pain in left finger(s): Secondary | ICD-10-CM

## 2017-12-18 MED ORDER — DICLOFENAC SODIUM 1 % TD GEL: TRANSDERMAL | 3 refills | Status: DC

## 2017-12-18 NOTE — Patient Instructions (Signed)

## 2018-06-09 NOTE — Progress Notes (Deleted)
Office Visit Note  Patient: Heather Wagner             Date of Birth: 04-08-1976           MRN: [id number]             PCP: Maurice Small, MD Referring: Maurice Small, MD Visit Date: 06/23/2018 Occupation: @GUAROCC @    Subjective:  No chief complaint on file.   History of Present Illness: [name] is a 42 y.o. female ***   Activities of Daily Living:  Patient reports morning stiffness for *** {minute/hour:19697}.   Patient {ACTIONS;DENIES/REPORTS:21021675::"Denies"} nocturnal pain.  Difficulty dressing/grooming: {ACTIONS;DENIES/REPORTS:21021675::"Denies"} Difficulty climbing stairs: {ACTIONS;DENIES/REPORTS:21021675::"Denies"} Difficulty getting out of chair: {ACTIONS;DENIES/REPORTS:21021675::"Denies"} Difficulty using hands for taps, buttons, cutlery, and/or writing: {ACTIONS;DENIES/REPORTS:21021675::"Denies"}   No Rheumatology ROS completed.   PMFS History:  Patient Active Problem List   Diagnosis Date Noted  . Intractable pain   . Acute on chronic pancreatitis (HCC) 10/28/2017  . DDD (degenerative disc disease), cervical 07/12/2017  . DDD (degenerative disc disease), lumbar 07/12/2017  . Primary insomnia 05/30/2017  . Other fatigue 05/30/2017  . ANA positive 04/19/2017  . History of chronic sinusitis 04/19/2017  . Fibromyalgia 10/24/2016  . Osteoarthritis of both knees 10/24/2016  . Sjogren's syndrome (HCC) 10/24/2016  . IBS (irritable bowel syndrome) 10/24/2016  . Migraines 10/24/2016  . Chronic sinusitis 10/24/2016  . Pelvic floor dysfunction 10/24/2016  . Vulvar vestibulitis 10/24/2016  . Dehydration 07/11/2016  . Fatty liver 07/11/2016  . Elevated LFTs 07/11/2016  . 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 Abdominal pain 01/05/2016  . Pancreatitis, acute   . CAP (community acquired pneumonia) 06/19/2014  . Pain management 06/18/2014  . Chronic pancreatitis (HCC) 06/18/2014  . Intractable migraine without aura 01/25/2014  .  Acute pancreatitis 09/25/2013  . Leucocytosis 09/25/2013  . Microcytic anemia 09/25/2013  . Asthma 09/25/2013  . Type 2 diabetes mellitus without complication, with long-term current use of insulin (HCC) 09/25/2013  . Hyponatremia 09/25/2013    Past Medical History:  Diagnosis Date  . Asthma   . Chronic sinusitis   . Diabetes mellitus   . Fibromyalgia   . Hyperlipidemia   . IBS (irritable bowel syndrome)   . Migraine without aura, with intractable migraine, so stated, without mention of status migrainosus 01/25/2014  . Migraines   . Osteoarthritis of both knees 10/24/2016   moderate  . Pancreatitis chronic   . Pelvic floor dysfunction 10/24/2016  . Sjogren's syndrome (HCC)   . Vulvar vestibulitis     Family History  Problem Relation Age of Onset  . Asthma Mother   . High Cholesterol Father   . Macular degeneration Father   . Migraines Sister   . Endometriosis Sister   . Asthma Brother   . GER disease Brother   . Pancreatitis Paternal Grandfather   . Pancreatic cancer Other   . Pancreatitis Maternal Uncle    Past Surgical History:  Procedure Laterality Date  . BREAST REDUCTION SURGERY  2010  . DILATION AND CURETTAGE OF UTERUS  2007  . ERCP     Removed stone from Bile duct  . ERCP     with MRI  . PANCREAS SURGERY  1997  . whiple  1997  . whipple's     Social History   Social History Narrative  . Not on file     Objective: Vital Signs: There were no vitals taken for this visit.   Physical Exam   Musculoskeletal Exam: ***  CDAI Exam: No  CDAI exam completed.    Investigation: No additional findings.   Imaging: No results found.  Speciality Comments: No specialty comments available.    Procedures:  No procedures performed Allergies: Ciprofloxacin hcl; Nortriptyline hcl; Amitriptyline; Compazine [prochlorperazine maleate]; Cymbalta [duloxetine hcl]; Dexilant [dexlansoprazole]; Doxycycline; Flagyl [metronidazole hcl]; Gabapentin; Penicillins;  Penicillins cross reactors; Septra [sulfamethoxazole-trimethoprim]; Tizanidine; Topamax [topiramate]; Toradol [ketorolac tromethamine]; Zofran [ondansetron hcl]; Amoxicillin; Diflucan [fluconazole]; Inapsine [droperidol]; Ketamine; Levofloxacin; Lyrica [pregabalin]; Phenergan [promethazine hcl]; Phenothiazines; Primaxin [imipenem]; Prozac [fluoxetine hcl]; and Reglan [metoclopramide hcl]   Assessment / Plan:     Visit Diagnoses: No diagnosis found.    Orders: No orders of the defined types were placed in this encounter.  No orders of the defined types were placed in this encounter.   Face-to-face time spent with patient was *** minutes. Greater than 50% of time was spent in counseling and coordination of care.  Follow-Up Instructions: No follow-ups on file.   Ellen HenriMarissa C Shabnam Ladd, CMA  Note - This record has been created using Animal nutritionistDragon software.  Chart creation errors have been sought, but may not always  have been located. Such creation errors do not reflect on  the standard of medical care.

## 2018-06-19 NOTE — Progress Notes (Signed)
Office Visit Note  Patient: Heather Wagner             Date of Birth: January 26, 1976           MRN: [id number]             PCP: Maurice Small, MD Referring: Maurice Small, MD Visit Date: 07/03/2018 Occupation: @GUAROCC @  Subjective:  Generalized pain   History of Present Illness: Heather Wagner is a 42 y.o. female with history of fibromyalgia, osteoarthritis and degenerative disc disease.  She states she has been having flare of her fibromyalgia with generalized pain and discomfort.  She has been having increased pelvic pain due to frequent periods..  She is planning to undergo hysterectomy after her hemoglobin A1c improves.  She was also having frequent IBS symptoms which have improved now.  She has been having some discomfort in her left knee joint for which she has been using Voltaren gel and also going for physical therapy.  She has been going to integrative therapies for fibromyalgia discomfort.  She continues to have discomfort in her cervical and lumbar spine.  She has been also having some discomfort in the left trochanteric bursa.  Activities of Daily Living:  Patient reports morning stiffness for 2-3 hours.   Patient Denies nocturnal pain.  Difficulty dressing/grooming: Denies Difficulty climbing stairs: Reports Difficulty getting out of chair: Denies Difficulty using hands for taps, buttons, cutlery, and/or writing: Denies  Review of Systems  Constitutional: Negative for fatigue, night sweats, weight gain and weight loss.  HENT: Positive for mouth dryness. Negative for mouth sores, trouble swallowing, trouble swallowing and nose dryness.   Eyes: Positive for dryness. Negative for pain, redness and visual disturbance.  Respiratory: Negative for cough, shortness of breath and difficulty breathing.   Cardiovascular: Negative for chest pain, palpitations, hypertension, irregular heartbeat and swelling in legs/feet.  Gastrointestinal: Positive for constipation and  diarrhea. Negative for blood in stool.  Endocrine: Negative for increased urination.  Genitourinary: Positive for pelvic pain. Negative for vaginal dryness.  Musculoskeletal: Positive for arthralgias, joint pain, myalgias, morning stiffness and myalgias. Negative for joint swelling, muscle weakness and muscle tenderness.  Skin: Positive for sensitivity to sunlight. Negative for color change, rash, hair loss, skin tightness and ulcers.  Allergic/Immunologic: Negative for susceptible to infections.  Neurological: Positive for headaches. Negative for dizziness, numbness, memory loss, night sweats and weakness.  Hematological: Negative for swollen glands.  Psychiatric/Behavioral: Negative for depressed mood and sleep disturbance. The patient is not nervous/anxious.     PMFS History:  Patient Active Problem List   Diagnosis Date Noted  . Intractable pain   . Acute on chronic pancreatitis (HCC) 10/28/2017  . DDD (degenerative disc disease), cervical 07/12/2017  . DDD (degenerative disc disease), lumbar 07/12/2017  . Primary insomnia 05/30/2017  . Other fatigue 05/30/2017  . ANA positive 04/19/2017  . History of chronic sinusitis 04/19/2017  . Fibromyalgia 10/24/2016  . Osteoarthritis of both knees 10/24/2016  . Sjogren's syndrome (HCC) 10/24/2016  . IBS (irritable bowel syndrome) 10/24/2016  . Migraines 10/24/2016  . Chronic sinusitis 10/24/2016  . Pelvic floor dysfunction 10/24/2016  . Vulvar vestibulitis 10/24/2016  . Dehydration 07/11/2016  . Fatty liver 07/11/2016  . Elevated LFTs 07/11/2016  . 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 Abdominal pain 01/05/2016  . Pancreatitis, acute   . CAP (community acquired pneumonia) 06/19/2014  . Pain management 06/18/2014  . Chronic pancreatitis (HCC) 06/18/2014  . Intractable migraine without aura 01/25/2014  . Acute pancreatitis 09/25/2013  .  Leucocytosis 09/25/2013  . Microcytic anemia 09/25/2013  . Asthma 09/25/2013  .  Type 2 diabetes mellitus without complication, with long-term current use of insulin (HCC) 09/25/2013  . Hyponatremia 09/25/2013    Past Medical History:  Diagnosis Date  . Asthma   . Chronic sinusitis   . Diabetes mellitus   . Fibromyalgia   . Hyperlipidemia   . IBS (irritable bowel syndrome)   . Migraine without aura, with intractable migraine, so stated, without mention of status migrainosus 01/25/2014  . Migraines   . Osteoarthritis of both knees 10/24/2016   moderate  . Pancreatitis chronic   . Pelvic floor dysfunction 10/24/2016  . Sjogren's syndrome (HCC)   . Vulvar vestibulitis     Family History  Problem Relation Age of Onset  . Asthma Mother   . High Cholesterol Father   . Macular degeneration Father   . Migraines Sister   . Endometriosis Sister   . Asthma Brother   . GER disease Brother   . Pancreatitis Paternal Grandfather   . Pancreatic cancer Other   . Pancreatitis Maternal Uncle    Past Surgical History:  Procedure Laterality Date  . BREAST REDUCTION SURGERY  2010  . DILATION AND CURETTAGE OF UTERUS  2007  . ERCP     Removed stone from Bile duct  . ERCP     with MRI  . PANCREAS SURGERY  1997  . whiple  1997  . whipple's     Social History   Social History Narrative  . Not on file    Objective: Vital Signs: BP 124/83 (BP Location: Left Arm, Patient Position: Sitting, Cuff Size: Normal)   Pulse 76   Ht 5\' 4"  (1.626 m)   Wt 171 lb (77.6 kg)   BMI 29.35 kg/m    Physical Exam  Constitutional: She is oriented to person, place, and time. She appears well-developed and well-nourished.  HENT:  Head: Normocephalic and atraumatic.  Eyes: Conjunctivae and EOM are normal.  Neck: Normal range of motion.  Cardiovascular: Normal rate, regular rhythm, normal heart sounds and intact distal pulses.  Pulmonary/Chest: Effort normal and breath sounds normal.  Abdominal: Soft. Bowel sounds are normal.  Lymphadenopathy:    She has no cervical adenopathy.    Neurological: She is alert and oriented to person, place, and time.  Skin: Skin is warm and dry. Capillary refill takes less than 2 seconds.  Psychiatric: She has a normal mood and affect. Her behavior is normal.  Nursing note and vitals reviewed.    Musculoskeletal Exam: C-spine thoracic lumbar spine limited range of motion.  Shoulder joints elbow joints wrist joint MCPs PIPs DIPs were in good range of motion.  Hip joints knee joints ankles MTPs PIPs DIPs were in good range of motion.  She is some crepitus in her knee joints without any warmth swelling or effusion.  She has generalized hyperalgesia with positive tender points.  She had tenderness on palpation of her bilateral trochanteric bursa worse on the left side.  CDAI Exam: No CDAI exam completed.   Investigation: No additional findings.  Imaging: No results found.  Recent Labs: Lab Results  Component Value Date   WBC 12.1 (H) 10/29/2017   HGB 8.1 (L) 10/29/2017   PLT 350 10/29/2017   NA 134 (L) 10/30/2017   K 4.2 10/30/2017   CL 104 10/30/2017   CO2 24 10/30/2017   GLUCOSE 165 (H) 10/30/2017   BUN <5 (L) 10/30/2017   CREATININE 0.50 10/30/2017   BILITOT  0.9 10/28/2017   ALKPHOS 77 10/28/2017   AST 21 10/28/2017   ALT 20 10/28/2017   PROT 7.1 10/28/2017   ALBUMIN 3.5 10/28/2017   CALCIUM 8.7 (L) 10/30/2017   GFRAA >60 10/30/2017    Speciality Comments: No specialty comments available.  Procedures:  No procedures performed Allergies: Ciprofloxacin hcl; Nortriptyline hcl; Amitriptyline; Compazine [prochlorperazine maleate]; Cymbalta [duloxetine hcl]; Dexilant [dexlansoprazole]; Doxycycline; Flagyl [metronidazole hcl]; Gabapentin; Penicillins; Penicillins cross reactors; Septra [sulfamethoxazole-trimethoprim]; Tizanidine; Topamax [topiramate]; Toradol [ketorolac tromethamine]; Zofran [ondansetron hcl]; Amoxicillin; Diflucan [fluconazole]; Inapsine [droperidol]; Ketamine; Levofloxacin; Lyrica [pregabalin]; Phenergan  [promethazine hcl]; Phenothiazines; Primaxin [imipenem]; Prozac [fluoxetine hcl]; and Reglan [metoclopramide hcl]   Assessment / Plan:     Visit Diagnoses: Fibromyalgia-she is having a flare of fibromyalgia with increased pain.  She states the pelvic pain is triggering discomfort all over.  Need for regular exercise and swimming was discussed.  Other fatigue-related to insomnia.  Primary insomnia-she has been experiencing insomnia secondary to nocturnal pain.  Primary osteoarthritis of both knees-she has some chronic discomfort in her knee joints.  She has been having increased left knee joint pain.  She has been using Voltaren gel.  Sjogren's syndrome with other organ involvement (HCC) - Negative ANA, positive SSB: She uses over-the-counter products discussed.  DDD (degenerative disc disease), cervical-chronic pain.  DDD (degenerative disc disease), lumbar-chronic pain.  Greater trochanteric bursitis of both hips-she has been having increased left trochanteric bursa discomfort.  Active and exercise were discussed.  Fatty liver-she is trying weight loss.  Dietary modification was discussed.  History of migraine  Pelvic floor dysfunction-she has been experiencing increased pain and frequent periods.  She is planning to undergo hysterectomy near future.  History of pancreatitis - Hereditary, followed up at [facility]  Hospitalized November 25-28th.  History of diabetes mellitus  History of IBS   Orders: No orders of the defined types were placed in this encounter.  No orders of the defined types were placed in this encounter.   Face-to-face time spent with patient was 30 minutes. Greater than 50% of time was spent in counseling and coordination of care.  Follow-Up Instructions: Return in about 6 months (around 01/03/2019) for FMS OA DDD.   Pollyann Savoy, MD  Note - This record has been created using Animal nutritionist.  Chart creation errors have been sought, but may not always    have been located. Such creation errors do not reflect on  the standard of medical care.

## 2018-06-23 ENCOUNTER — Ambulatory Visit: Payer: Managed Care, Other (non HMO) | Admitting: Rheumatology

## 2018-07-03 ENCOUNTER — Ambulatory Visit: Payer: Managed Care, Other (non HMO) | Admitting: Rheumatology

## 2018-07-03 ENCOUNTER — Encounter: Payer: Self-pay | Admitting: Rheumatology

## 2018-07-03 VITALS — BP 124/83 | HR 76 | Ht 64.0 in | Wt 171.0 lb

## 2018-07-03 DIAGNOSIS — M7061 Trochanteric bursitis, right hip: Secondary | ICD-10-CM

## 2018-07-03 DIAGNOSIS — K760 Fatty (change of) liver, not elsewhere classified: Secondary | ICD-10-CM

## 2018-07-03 DIAGNOSIS — Z8719 Personal history of other diseases of the digestive system: Secondary | ICD-10-CM

## 2018-07-03 DIAGNOSIS — M6289 Other specified disorders of muscle: Secondary | ICD-10-CM

## 2018-07-03 DIAGNOSIS — M5136 Other intervertebral disc degeneration, lumbar region: Secondary | ICD-10-CM

## 2018-07-03 DIAGNOSIS — F5101 Primary insomnia: Secondary | ICD-10-CM

## 2018-07-03 DIAGNOSIS — R5383 Other fatigue: Secondary | ICD-10-CM | POA: Diagnosis not present

## 2018-07-03 DIAGNOSIS — Z8669 Personal history of other diseases of the nervous system and sense organs: Secondary | ICD-10-CM

## 2018-07-03 DIAGNOSIS — M5030 Other cervical disc degeneration, unspecified cervical region: Secondary | ICD-10-CM

## 2018-07-03 DIAGNOSIS — Z8639 Personal history of other endocrine, nutritional and metabolic disease: Secondary | ICD-10-CM

## 2018-07-03 DIAGNOSIS — M797 Fibromyalgia: Secondary | ICD-10-CM

## 2018-07-03 DIAGNOSIS — M7062 Trochanteric bursitis, left hip: Secondary | ICD-10-CM

## 2018-07-03 DIAGNOSIS — M170 Bilateral primary osteoarthritis of knee: Secondary | ICD-10-CM | POA: Diagnosis not present

## 2018-07-03 DIAGNOSIS — M3509 Sicca syndrome with other organ involvement: Secondary | ICD-10-CM

## 2018-10-16 ENCOUNTER — Encounter: Payer: Self-pay | Admitting: Pulmonary Disease

## 2018-10-16 ENCOUNTER — Ambulatory Visit: Payer: Managed Care, Other (non HMO) | Admitting: Pulmonary Disease

## 2018-10-16 VITALS — BP 124/82 | HR 85 | Ht 64.0 in | Wt 168.0 lb

## 2018-10-16 DIAGNOSIS — J4530 Mild persistent asthma, uncomplicated: Secondary | ICD-10-CM

## 2018-10-16 NOTE — Patient Instructions (Signed)
Mild persistent asthma: Continue Qvar 2 puffs twice a day Continue albuterol 2 puffs every 4-6 hours as needed for chest tightness wheezing or shortness of breath I am glad that you have had a flu shot this year After December 03, 2018 consult your insurance medication formulary to see what alternatives to Qvar exist so that we might consider this as you have had some trouble with consistency from the Qvar device  We will see you back in 1 year or sooner if needed

## 2018-10-16 NOTE — Progress Notes (Signed)
Subjective:    Patient ID: Heather Wagner, female    DOB: 1976-08-21, 42 y.o.   MRN: [id number]  Synopsis: Former patient of Dr. Shelle Ironlance with asthma.  She also has chronic pancreatitis. Diagnosed with asthma as a child.  Also has allergic rhinitis, Sjogren's diease.   HPI Chief Complaint  Patient presents with  . Follow-up    pt is doing well overall.    Some tightness in her chest in the summer more.  She hasn't used albuterol in about 2 months.  She has not been xercising much.  She says that she has struggled with her chronic pancreatitis and fibromyalgia.  She says that sometimes she notices mild chest tightness.  She is still taking QVar, but she says that the new delivery devices has not been as effective as before.  She says that sometimes the new devices tends to fail easily.  She has to keep it stored upright in a dry location.   Past Medical History:  Diagnosis Date  . Asthma   . Chronic sinusitis   . Diabetes mellitus   . Fibromyalgia   . Hyperlipidemia   . IBS (irritable bowel syndrome)   . Migraine without aura, with intractable migraine, so stated, without mention of status migrainosus 01/25/2014  . Migraines   . Osteoarthritis of both knees 10/24/2016   moderate  . Pancreatitis chronic   . Pelvic floor dysfunction 10/24/2016  . Sjogren's syndrome (HCC)   . Vulvar vestibulitis        Review of Systems  Constitutional: Positive for fatigue. Negative for chills and fever.  HENT: Positive for postnasal drip, rhinorrhea and sinus pressure.   Respiratory: Positive for chest tightness and shortness of breath. Negative for cough and wheezing.   Cardiovascular: Negative for chest pain, palpitations and leg swelling.       Objective:   Physical Exam  Vitals:   10/16/18 1414  BP: 124/82  Pulse: 85  SpO2: 96%  Weight: 168 lb (76.2 kg)  Height: 5\' 4"  (1.626 m)   RA  Gen: well appearing HENT: OP clear, TM's clear, neck supple PULM: CTA B, normal  percussion CV: RRR, no mgr, trace edema GI: BS+, soft, nontender Derm: no cyanosis or rash Psyche: normal mood and affect   PFT: December 2017 ratio 77%, FEV1 2.81 L 90% predicted, 13% change post bronchodilator correlating to 330 mL change, FVC 3.62 L 95% predicted, total lung capacity 5.15 L 98% predicted, DLCO 18.56, 72% predicted  Exhaled NO: 01/2016 20 ppm   Labs: 11/2016 RAST > essentially negative 11/2016 IgE> 39  CBC    Component Value Date/Time   WBC 12.1 (H) 10/29/2017 0503   RBC 3.87 10/29/2017 0503   HGB 8.1 (L) 10/29/2017 0503   HCT 26.9 (L) 10/29/2017 0503   HCT 28.5 (L) 01/06/2016 0610   PLT 350 10/29/2017 0503   MCV 69.5 (L) 10/29/2017 0503   MCH 20.9 (L) 10/29/2017 0503   MCHC 30.1 10/29/2017 0503   RDW 17.4 (H) 10/29/2017 0503   LYMPHSABS 2.6 10/28/2017 0125   MONOABS 1.4 (H) 10/28/2017 0125   EOSABS 0.0 10/28/2017 0125   BASOSABS 0.0 10/28/2017 0125    Notes from her visit in December 2017 reviewed were she was treated for worsening asthma symptoms.     Assessment & Plan:   Mild persistent asthma without complication  Discussion: This has been a stable interval for Lurena JoinerRebecca.  She has some symptoms in the summertime but minimal during the fall.  However,  she says that if she forgets her Qvar she starts to feel more symptomatic so I do not think we can stop her controller therapy even though she has well-controlled asthma.  Plan: Mild persistent asthma: Continue Qvar 2 puffs twice a day Continue albuterol 2 puffs every 4-6 hours as needed for chest tightness wheezing or shortness of breath I am glad that you have had a flu shot this year After December 03, 2018 consult your insurance medication formulary to see what alternatives to Qvar exist so that we might consider this as you have had some trouble with consistency from the Qvar device  We will see you back in 1 year or sooner if needed    Current Outpatient Medications:  .  albuterol  (PROVENTIL HFA;VENTOLIN HFA) 108 (90 BASE) MCG/ACT inhaler, Inhale 1-2 puffs into the lungs every 6 (six) hours as needed for wheezing. Asthma, Disp: , Rfl:  .  BAYER CONTOUR NEXT TEST test strip, USE TO CHECK BLOOD SUGAR 4X A DAY (B08.9), Disp: , Rfl: 5 .  Beclomethasone Diprop HFA (QVAR REDIHALER) 80 MCG/ACT AERB, Inhale 2 puffs into the lungs daily., Disp: 1 Inhaler, Rfl: 0 .  calcium carbonate (TUMS EX) 750 MG chewable tablet, Chew 2-4 tablets by mouth 2 (two) times daily as needed for heartburn. No more than 10 a day, Disp: , Rfl:  .  Calcium-Vitamin D-Vitamin K 500-500-40 MG-UNT-MCG CHEW, Chew by mouth daily., Disp: , Rfl:  .  carboxymethylcellulose (REFRESH PLUS) 0.5 % SOLN, 1-2 drops every 2 (two) hours as needed for Dry eyes, Disp: , Rfl:  .  diclofenac sodium (VOLTAREN) 1 % GEL, APPLY 3 GRAMS TO 3 LARGE JOINTS 3 TIMES DAILY AS NEEDED FOR PAIN, Disp: , Rfl: 3 .  diclofenac sodium (VOLTAREN) 1 % GEL, Apply 3 grams to three large joints up to three times daily as needed, Disp: 3 Tube, Rfl: 3 .  HYDROcodone-acetaminophen (NORCO/VICODIN) 5-325 MG per tablet, Take 1-2 tablets by mouth every 6 (six) hours as needed for moderate pain. , Disp: , Rfl:  .  ibuprofen (ADVIL,MOTRIN) 200 MG tablet, Take 400-800 mg by mouth every 8 (eight) hours as needed for moderate pain. , Disp: , Rfl:  .  insulin aspart (NOVOLOG) 100 UNIT/ML injection, Inject 1-5 Units into the skin. 5-6 times daily with meals and extra if needed, injecting 1 unit for every 15 carbs, 1 unit every 50 over 150., Disp: , Rfl:  .  Insulin Glargine (BASAGLAR KWIKPEN) 100 UNIT/ML SOPN, Inject 22 Units into the skin 2 (two) times daily. , Disp: , Rfl:  .  Magnesium 300 MG CAPS, Take 300 mg by mouth every evening. , Disp: , Rfl:  .  Melatonin 5 MG TABS, Take 1 tablet by mouth at bedtime., Disp: , Rfl:  .  MICROLET LANCETS MISC, AS DIRECTED FOUR TIMES A DAY 30 DAYS, Disp: , Rfl: 2 .  Multiple Vitamins-Minerals (ALIVE WOMENS GUMMY PO), Take 2  tablets by mouth daily after breakfast. , Disp: , Rfl:  .  omeprazole (PRILOSEC) 20 MG capsule, Take 20 mg by mouth every morning. , Disp: , Rfl:  .  OVER THE COUNTER MEDICATION, Take 1 Dose by mouth at bedtime as needed (insomnia). Valarian/liqourice root tea, Disp: , Rfl:  .  polyethylene glycol (MIRALAX / GLYCOLAX) packet, Take 17 g by mouth daily as needed for mild constipation., Disp: , Rfl:  .  Probiotic Product (PROBIOTIC DAILY PO), Take 1 capsule by mouth daily. Reported on 01/16/2016, Disp: , Rfl:  .  rizatriptan (MAXALT) 10 MG tablet, Take 10 mg by mouth as needed for migraine. May repeat in 2 hours if needed for headaches, Disp: , Rfl:  .  ZENPEP 25000 units CPEP, Take 1-3 capsules by mouth 3 (three) times daily with meals as needed. 3-4 caps per meal and 1-2 caps per snack, Disp: , Rfl: 11

## 2018-10-29 ENCOUNTER — Ambulatory Visit: Payer: Managed Care, Other (non HMO) | Admitting: Pulmonary Disease

## 2018-11-03 ENCOUNTER — Other Ambulatory Visit: Payer: Self-pay | Admitting: Physician Assistant

## 2018-11-03 NOTE — Telephone Encounter (Signed)
Last Visit: 07/03/18 Next Visit: 01/08/19  Okay to refill per Dr. Corliss Skainseveshwar

## 2018-12-25 NOTE — Progress Notes (Deleted)
Office Visit Note  Patient: Heather Wagner             Date of Birth: 25-May-1976           MRN: [id number]             PCP: Maurice Small, MD Referring: Maurice Small, MD Visit Date: 01/08/2019 Occupation: @GUAROCC @  Subjective:  No chief complaint on file.   History of Present Illness: Heather Wagner is a 43 y.o. female ***   Activities of Daily Living:  Patient reports morning stiffness for *** {minute/hour:19697}.   Patient {ACTIONS;DENIES/REPORTS:21021675::"Denies"} nocturnal pain.  Difficulty dressing/grooming: {ACTIONS;DENIES/REPORTS:21021675::"Denies"} Difficulty climbing stairs: {ACTIONS;DENIES/REPORTS:21021675::"Denies"} Difficulty getting out of chair: {ACTIONS;DENIES/REPORTS:21021675::"Denies"} Difficulty using hands for taps, buttons, cutlery, and/or writing: {ACTIONS;DENIES/REPORTS:21021675::"Denies"}  No Rheumatology ROS completed.   PMFS History:  Patient Active Problem List   Diagnosis Date Noted  . Intractable pain   . Acute on chronic pancreatitis (HCC) 10/28/2017  . DDD (degenerative disc disease), cervical 07/12/2017  . DDD (degenerative disc disease), lumbar 07/12/2017  . Primary insomnia 05/30/2017  . Other fatigue 05/30/2017  . ANA positive 04/19/2017  . History of chronic sinusitis 04/19/2017  . Fibromyalgia 10/24/2016  . Osteoarthritis of both knees 10/24/2016  . Sjogren's syndrome (HCC) 10/24/2016  . IBS (irritable bowel syndrome) 10/24/2016  . Migraines 10/24/2016  . Chronic sinusitis 10/24/2016  . Pelvic floor dysfunction 10/24/2016  . Vulvar vestibulitis 10/24/2016  . Dehydration 07/11/2016  . Fatty liver 07/11/2016  . Elevated LFTs 07/11/2016  . 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 Abdominal pain 01/05/2016  . Pancreatitis, acute   . CAP (community acquired pneumonia) 06/19/2014  . Pain management 06/18/2014  . Chronic pancreatitis (HCC) 06/18/2014  . Intractable migraine without aura 01/25/2014  . Acute  pancreatitis 09/25/2013  . Leucocytosis 09/25/2013  . Microcytic anemia 09/25/2013  . Asthma 09/25/2013  . Type 2 diabetes mellitus without complication, with long-term current use of insulin (HCC) 09/25/2013  . Hyponatremia 09/25/2013    Past Medical History:  Diagnosis Date  . Asthma   . Chronic sinusitis   . Diabetes mellitus   . Fibromyalgia   . Hyperlipidemia   . IBS (irritable bowel syndrome)   . Migraine without aura, with intractable migraine, so stated, without mention of status migrainosus 01/25/2014  . Migraines   . Osteoarthritis of both knees 10/24/2016   moderate  . Pancreatitis chronic   . Pelvic floor dysfunction 10/24/2016  . Sjogren's syndrome (HCC)   . Vulvar vestibulitis     Family History  Problem Relation Age of Onset  . Asthma Mother   . High Cholesterol Father   . Macular degeneration Father   . Migraines Sister   . Endometriosis Sister   . Asthma Brother   . GER disease Brother   . Pancreatitis Paternal Grandfather   . Pancreatic cancer Other   . Pancreatitis Maternal Uncle    Past Surgical History:  Procedure Laterality Date  . BREAST REDUCTION SURGERY  2010  . DILATION AND CURETTAGE OF UTERUS  2007  . ERCP     Removed stone from Bile duct  . ERCP     with MRI  . PANCREAS SURGERY  1997  . whiple  1997  . whipple's     Social History   Social History Narrative  . Not on file   Immunization History  Administered Date(s) Administered  . Influenza Split 10/08/2018  . Influenza-Unspecified 09/02/2014     Objective: Vital Signs: There were no vitals taken for this  visit.   Physical Exam   Musculoskeletal Exam: ***  CDAI Exam: CDAI Score: Not documented Patient Global Assessment: Not documented; Provider Global Assessment: Not documented Swollen: Not documented; Tender: Not documented Joint Exam   Not documented   There is currently no information documented on the homunculus. Go to the Rheumatology activity and complete the  homunculus joint exam.  Investigation: No additional findings.  Imaging: No results found.  Recent Labs: Lab Results  Component Value Date   WBC 12.1 (H) 10/29/2017   HGB 8.1 (L) 10/29/2017   PLT 350 10/29/2017   NA 134 (L) 10/30/2017   K 4.2 10/30/2017   CL 104 10/30/2017   CO2 24 10/30/2017   GLUCOSE 165 (H) 10/30/2017   BUN <5 (L) 10/30/2017   CREATININE 0.50 10/30/2017   BILITOT 0.9 10/28/2017   ALKPHOS 77 10/28/2017   AST 21 10/28/2017   ALT 20 10/28/2017   PROT 7.1 10/28/2017   ALBUMIN 3.5 10/28/2017   CALCIUM 8.7 (L) 10/30/2017   GFRAA >60 10/30/2017    Speciality Comments: No specialty comments available.  Procedures:  No procedures performed Allergies: Ciprofloxacin hcl; Nortriptyline hcl; Amitriptyline; Compazine [prochlorperazine maleate]; Cymbalta [duloxetine hcl]; Dexilant [dexlansoprazole]; Doxycycline; Flagyl [metronidazole hcl]; Gabapentin; Penicillins; Penicillins cross reactors; Septra [sulfamethoxazole-trimethoprim]; Tizanidine; Topamax [topiramate]; Toradol [ketorolac tromethamine]; Zofran [ondansetron hcl]; Amoxicillin; Diflucan [fluconazole]; Inapsine [droperidol]; Ketamine; Levofloxacin; Lyrica [pregabalin]; Phenergan [promethazine hcl]; Phenothiazines; Primaxin [imipenem]; Prozac [fluoxetine hcl]; and Reglan [metoclopramide hcl]   Assessment / Plan:     Visit Diagnoses: No diagnosis found.   Orders: No orders of the defined types were placed in this encounter.  No orders of the defined types were placed in this encounter.   Face-to-face time spent with patient was *** minutes. Greater than 50% of time was spent in counseling and coordination of care.  Follow-Up Instructions: No follow-ups on file.   Ellen Henri, CMA  Note - This record has been created using Animal nutritionist.  Chart creation errors have been sought, but may not always  have been located. Such creation errors do not reflect on  the standard of medical care.

## 2019-01-08 ENCOUNTER — Other Ambulatory Visit: Payer: Self-pay | Admitting: Family Medicine

## 2019-01-08 ENCOUNTER — Ambulatory Visit
Admission: RE | Admit: 2019-01-08 | Discharge: 2019-01-08 | Disposition: A | Discharge status: Home / Self Care | Payer: Managed Care, Other (non HMO) | Source: Ambulatory Visit | Attending: Family Medicine | Admitting: Family Medicine

## 2019-01-08 ENCOUNTER — Ambulatory Visit: Payer: Managed Care, Other (non HMO) | Admitting: Rheumatology

## 2019-01-08 DIAGNOSIS — R059 Cough, unspecified: Secondary | ICD-10-CM

## 2019-01-08 DIAGNOSIS — R05 Cough: Secondary | ICD-10-CM

## 2019-01-08 DIAGNOSIS — R0600 Dyspnea, unspecified: Secondary | ICD-10-CM

## 2019-01-12 NOTE — Progress Notes (Deleted)
Office Visit Note  Patient: Heather Wagner             Date of Birth: 1976-02-20           MRN: [id number]             PCP: Maurice SmallGriffin, Elaine, MD Referring: Maurice SmallGriffin, Elaine, MD Visit Date: 01/21/2019 Occupation: @GUAROCC @  Subjective:  No chief complaint on file.   History of Present Illness: Heather Wagner is a 43 y.o. female ***   Activities of Daily Living:  Patient reports morning stiffness for *** {minute/hour:19697}.   Patient {ACTIONS;DENIES/REPORTS:21021675::"Denies"} nocturnal pain.  Difficulty dressing/grooming: {ACTIONS;DENIES/REPORTS:21021675::"Denies"} Difficulty climbing stairs: {ACTIONS;DENIES/REPORTS:21021675::"Denies"} Difficulty getting out of chair: {ACTIONS;DENIES/REPORTS:21021675::"Denies"} Difficulty using hands for taps, buttons, cutlery, and/or writing: {ACTIONS;DENIES/REPORTS:21021675::"Denies"}  No Rheumatology ROS completed.   PMFS History:  Patient Active Problem List   Diagnosis Date Noted  . Intractable pain   . Acute on chronic pancreatitis (HCC) 10/28/2017  . DDD (degenerative disc disease), cervical 07/12/2017  . DDD (degenerative disc disease), lumbar 07/12/2017  . Primary insomnia 05/30/2017  . Other fatigue 05/30/2017  . ANA positive 04/19/2017  . History of chronic sinusitis 04/19/2017  . Fibromyalgia 10/24/2016  . Osteoarthritis of both knees 10/24/2016  . Sjogren's syndrome (HCC) 10/24/2016  . IBS (irritable bowel syndrome) 10/24/2016  . Migraines 10/24/2016  . Chronic sinusitis 10/24/2016  . Pelvic floor dysfunction 10/24/2016  . Vulvar vestibulitis 10/24/2016  . Dehydration 07/11/2016  . Fatty liver 07/11/2016  . Elevated LFTs 07/11/2016  . 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 Abdominal pain 01/05/2016  . Pancreatitis, acute   . CAP (community acquired pneumonia) 06/19/2014  . Pain management 06/18/2014  . Chronic pancreatitis (HCC) 06/18/2014  . Intractable migraine without aura 01/25/2014  . Acute  pancreatitis 09/25/2013  . Leucocytosis 09/25/2013  . Microcytic anemia 09/25/2013  . Asthma 09/25/2013  . Type 2 diabetes mellitus without complication, with long-term current use of insulin (HCC) 09/25/2013  . Hyponatremia 09/25/2013    Past Medical History:  Diagnosis Date  . Asthma   . Chronic sinusitis   . Diabetes mellitus   . Fibromyalgia   . Hyperlipidemia   . IBS (irritable bowel syndrome)   . Migraine without aura, with intractable migraine, so stated, without mention of status migrainosus 01/25/2014  . Migraines   . Osteoarthritis of both knees 10/24/2016   moderate  . Pancreatitis chronic   . Pelvic floor dysfunction 10/24/2016  . Sjogren's syndrome (HCC)   . Vulvar vestibulitis     Family History  Problem Relation Age of Onset  . Asthma Mother   . High Cholesterol Father   . Macular degeneration Father   . Migraines Sister   . Endometriosis Sister   . Asthma Brother   . GER disease Brother   . Pancreatitis Paternal Grandfather   . Pancreatic cancer Other   . Pancreatitis Maternal Uncle    Past Surgical History:  Procedure Laterality Date  . BREAST REDUCTION SURGERY  2010  . DILATION AND CURETTAGE OF UTERUS  2007  . ERCP     Removed stone from Bile duct  . ERCP     with MRI  . PANCREAS SURGERY  1997  . whiple  1997  . whipple's     Social History   Social History Narrative  . Not on file   Immunization History  Administered Date(s) Administered  . Influenza Split 10/08/2018  . Influenza-Unspecified 09/02/2014     Objective: Vital Signs: There were no vitals taken for this  visit.   Physical Exam   Musculoskeletal Exam: ***  CDAI Exam: CDAI Score: Not documented Patient Global Assessment: Not documented; Provider Global Assessment: Not documented Swollen: Not documented; Tender: Not documented Joint Exam   Not documented   There is currently no information documented on the homunculus. Go to the Rheumatology activity and complete the  homunculus joint exam.  Investigation: No additional findings.  Imaging: Dg Chest 2 View  Result Date: 01/08/2019 CLINICAL DATA:  Cough, fever, shortness of breath EXAM: CHEST - 2 VIEW COMPARISON:  11/21/2016 FINDINGS: The heart size and mediastinal contours are within normal limits. Both lungs are clear. The visualized skeletal structures are unremarkable. IMPRESSION: No acute abnormality of the lungs.  No focal airspace opacity. Electronically Signed   By: Lauralyn Primes M.D.   On: 01/08/2019 14:36    Recent Labs: Lab Results  Component Value Date   WBC 12.1 (H) 10/29/2017   HGB 8.1 (L) 10/29/2017   PLT 350 10/29/2017   NA 134 (L) 10/30/2017   K 4.2 10/30/2017   CL 104 10/30/2017   CO2 24 10/30/2017   GLUCOSE 165 (H) 10/30/2017   BUN <5 (L) 10/30/2017   CREATININE 0.50 10/30/2017   BILITOT 0.9 10/28/2017   ALKPHOS 77 10/28/2017   AST 21 10/28/2017   ALT 20 10/28/2017   PROT 7.1 10/28/2017   ALBUMIN 3.5 10/28/2017   CALCIUM 8.7 (L) 10/30/2017   GFRAA >60 10/30/2017    Speciality Comments: No specialty comments available.  Procedures:  No procedures performed Allergies: Ciprofloxacin hcl; Nortriptyline hcl; Amitriptyline; Compazine [prochlorperazine maleate]; Cymbalta [duloxetine hcl]; Dexilant [dexlansoprazole]; Doxycycline; Flagyl [metronidazole hcl]; Gabapentin; Penicillins; Penicillins cross reactors; Septra [sulfamethoxazole-trimethoprim]; Tizanidine; Topamax [topiramate]; Toradol [ketorolac tromethamine]; Zofran [ondansetron hcl]; Amoxicillin; Diflucan [fluconazole]; Inapsine [droperidol]; Ketamine; Levofloxacin; Lyrica [pregabalin]; Phenergan [promethazine hcl]; Phenothiazines; Primaxin [imipenem]; Prozac [fluoxetine hcl]; and Reglan [metoclopramide hcl]   Assessment / Plan:     Visit Diagnoses: Fibromyalgia  Other fatigue  Primary insomnia  Primary osteoarthritis of both knees  Sjogren's syndrome with other organ involvement (HCC) - Negative ANA, positive SSB:  She uses over-the-counter products discussed.  DDD (degenerative disc disease), cervical  DDD (degenerative disc disease), lumbar  Greater trochanteric bursitis of both hips  Fatty liver  History of migraine  Pelvic floor dysfunction  History of pancreatitis  History of diabetes mellitus  History of IBS   Orders: No orders of the defined types were placed in this encounter.  No orders of the defined types were placed in this encounter.   Face-to-face time spent with patient was *** minutes. Greater than 50% of time was spent in counseling and coordination of care.  Follow-Up Instructions: No follow-ups on file.   Gearldine Bienenstock, PA-C  Note - This record has been created using Dragon software.  Chart creation errors have been sought, but may not always  have been located. Such creation errors do not reflect on  the standard of medical care.

## 2019-01-21 ENCOUNTER — Ambulatory Visit: Payer: Managed Care, Other (non HMO) | Admitting: Rheumatology

## 2019-01-23 NOTE — Progress Notes (Deleted)
Office Visit Note  Patient: Heather Wagner             Date of Birth: 1976/07/10           MRN: [id number]             PCP: Maurice Small, MD Referring: Maurice Small, MD Visit Date: 01/28/2019 Occupation: @GUAROCC @  Subjective:  No chief complaint on file.   History of Present Illness: [name] is a 43 y.o. female ***   Activities of Daily Living:  Patient reports morning stiffness for *** {minute/hour:19697}.   Patient {ACTIONS;DENIES/REPORTS:21021675::"Denies"} nocturnal pain.  Difficulty dressing/grooming: {ACTIONS;DENIES/REPORTS:21021675::"Denies"} Difficulty climbing stairs: {ACTIONS;DENIES/REPORTS:21021675::"Denies"} Difficulty getting out of chair: {ACTIONS;DENIES/REPORTS:21021675::"Denies"} Difficulty using hands for taps, buttons, cutlery, and/or writing: {ACTIONS;DENIES/REPORTS:21021675::"Denies"}  No Rheumatology ROS completed.   PMFS History:  Patient Active Problem List   Diagnosis Date Noted  . Intractable pain   . Acute on chronic pancreatitis (HCC) 10/28/2017  . DDD (degenerative disc disease), cervical 07/12/2017  . DDD (degenerative disc disease), lumbar 07/12/2017  . Primary insomnia 05/30/2017  . Other fatigue 05/30/2017  . ANA positive 04/19/2017  . History of chronic sinusitis 04/19/2017  . Fibromyalgia 10/24/2016  . Osteoarthritis of both knees 10/24/2016  . Sjogren's syndrome (HCC) 10/24/2016  . IBS (irritable bowel syndrome) 10/24/2016  . Migraines 10/24/2016  . Chronic sinusitis 10/24/2016  . Pelvic floor dysfunction 10/24/2016  . Vulvar vestibulitis 10/24/2016  . Dehydration 07/11/2016  . Fatty liver 07/11/2016  . Elevated LFTs 07/11/2016  . 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 Abdominal pain 01/05/2016  . Pancreatitis, acute   . CAP (community acquired pneumonia) 06/19/2014  . Pain management 06/18/2014  . Chronic pancreatitis (HCC) 06/18/2014  . Intractable migraine without aura 01/25/2014  . Acute  pancreatitis 09/25/2013  . Leucocytosis 09/25/2013  . Microcytic anemia 09/25/2013  . Asthma 09/25/2013  . Type 2 diabetes mellitus without complication, with long-term current use of insulin (HCC) 09/25/2013  . Hyponatremia 09/25/2013    Past Medical History:  Diagnosis Date  . Asthma   . Chronic sinusitis   . Diabetes mellitus   . Fibromyalgia   . Hyperlipidemia   . IBS (irritable bowel syndrome)   . Migraine without aura, with intractable migraine, so stated, without mention of status migrainosus 01/25/2014  . Migraines   . Osteoarthritis of both knees 10/24/2016   moderate  . Pancreatitis chronic   . Pelvic floor dysfunction 10/24/2016  . Sjogren's syndrome (HCC)   . Vulvar vestibulitis     Family History  Problem Relation Age of Onset  . Asthma Mother   . High Cholesterol Father   . Macular degeneration Father   . Migraines Sister   . Endometriosis Sister   . Asthma Brother   . GER disease Brother   . Pancreatitis Paternal Grandfather   . Pancreatic cancer Other   . Pancreatitis Maternal Uncle    Past Surgical History:  Procedure Laterality Date  . BREAST REDUCTION SURGERY  2010  . DILATION AND CURETTAGE OF UTERUS  2007  . ERCP     Removed stone from Bile duct  . ERCP     with MRI  . PANCREAS SURGERY  1997  . whiple  1997  . whipple's     Social History   Social History Narrative  . Not on file   Immunization History  Administered Date(s) Administered  . Influenza Split 10/08/2018  . Influenza-Unspecified 09/02/2014     Objective: Vital Signs: There were no vitals taken for this  visit.   Physical Exam   Musculoskeletal Exam: ***  CDAI Exam: CDAI Score: Not documented Patient Global Assessment: Not documented; Provider Global Assessment: Not documented Swollen: Not documented; Tender: Not documented Joint Exam   Not documented   There is currently no information documented on the homunculus. Go to the Rheumatology activity and complete the  homunculus joint exam.  Investigation: No additional findings.  Imaging: Dg Chest 2 View  Result Date: 01/08/2019 CLINICAL DATA:  Cough, fever, shortness of breath EXAM: CHEST - 2 VIEW COMPARISON:  11/21/2016 FINDINGS: The heart size and mediastinal contours are within normal limits. Both lungs are clear. The visualized skeletal structures are unremarkable. IMPRESSION: No acute abnormality of the lungs.  No focal airspace opacity. Electronically Signed   By: Lauralyn Primes M.D.   On: 01/08/2019 14:36    Recent Labs: Lab Results  Component Value Date   WBC 12.1 (H) 10/29/2017   HGB 8.1 (L) 10/29/2017   PLT 350 10/29/2017   NA 134 (L) 10/30/2017   K 4.2 10/30/2017   CL 104 10/30/2017   CO2 24 10/30/2017   GLUCOSE 165 (H) 10/30/2017   BUN <5 (L) 10/30/2017   CREATININE 0.50 10/30/2017   BILITOT 0.9 10/28/2017   ALKPHOS 77 10/28/2017   AST 21 10/28/2017   ALT 20 10/28/2017   PROT 7.1 10/28/2017   ALBUMIN 3.5 10/28/2017   CALCIUM 8.7 (L) 10/30/2017   GFRAA >60 10/30/2017    Speciality Comments: No specialty comments available.  Procedures:  No procedures performed Allergies: Ciprofloxacin hcl; Nortriptyline hcl; Amitriptyline; Compazine [prochlorperazine maleate]; Cymbalta [duloxetine hcl]; Dexilant [dexlansoprazole]; Doxycycline; Flagyl [metronidazole hcl]; Gabapentin; Penicillins; Penicillins cross reactors; Septra [sulfamethoxazole-trimethoprim]; Tizanidine; Topamax [topiramate]; Toradol [ketorolac tromethamine]; Zofran [ondansetron hcl]; Amoxicillin; Diflucan [fluconazole]; Inapsine [droperidol]; Ketamine; Levofloxacin; Lyrica [pregabalin]; Phenergan [promethazine hcl]; Phenothiazines; Primaxin [imipenem]; Prozac [fluoxetine hcl]; and Reglan [metoclopramide hcl]   Assessment / Plan:     Visit Diagnoses: No diagnosis found.   Orders: No orders of the defined types were placed in this encounter.  No orders of the defined types were placed in this encounter.   Face-to-face  time spent with patient was *** minutes. Greater than 50% of time was spent in counseling and coordination of care.  Follow-Up Instructions: No follow-ups on file.   Ellen Henri, CMA  Note - This record has been created using Animal nutritionist.  Chart creation errors have been sought, but may not always  have been located. Such creation errors do not reflect on  the standard of medical care.

## 2019-01-28 ENCOUNTER — Ambulatory Visit: Payer: Managed Care, Other (non HMO) | Admitting: Physician Assistant

## 2019-02-05 NOTE — Progress Notes (Signed)
Office Visit Note  Patient: Heather Wagner             Date of Birth: [birth year]           MRN: [id number]             PCP: Maurice Small, MD Referring: Maurice Small, MD Visit Date: 02/10/2019 Occupation: @GUAROCC @  Subjective:  Left trochanteric bursitis   History of Present Illness: Benetta Ruffino is a 43 y.o. female with history of Sjogren's syndrome, fibromyalgia, DDD, and osteoarthritis. She reports she has been having more frequent and severe fibromyalgia flares.  She has generalized muscle aches and muscle tenderness.  She has left trochanteric bursitis and bilateral trapezius muscle tension. She continues to alternate heat and ice.  She uses voltaren gel topically prn for pain relief. She has been using CBD cream topically for muscle tenderness.  She gets massages which help.  She has left knee pain and intermittent joint swelling.  She continues to go to PT once weekly. She has not been exercising on a regular basis recently due to recurrent bouts of pancreatitis. Her fatigue has been worsening fatigue and chronic insomnia. She has left CMC joint pain but no joint swelling.  She continues to have chronic sicca symptoms.  She could not tolerate biotene products in the past.  She uses ginger lozengs and has been drinking more fluids.   She continues to have chronic pelvic pain.  She is planning on having a Hysterectomy June/July 2020.   Activities of Daily Living:  Patient reports morning stiffness for 1 hours.   Patient Reports nocturnal pain.  Difficulty dressing/grooming: Denies Difficulty climbing stairs: Reports Difficulty getting out of chair: Denies Difficulty using hands for taps, buttons, cutlery, and/or writing: Denies  Review of Systems  Constitutional: Positive for fatigue.  HENT: Positive for mouth dryness. Negative for mouth sores and nose dryness.   Eyes: Positive for dryness. Negative for pain and visual disturbance.  Respiratory: Negative for cough,  hemoptysis, shortness of breath and difficulty breathing.   Cardiovascular: Positive for palpitations. Negative for chest pain, hypertension and swelling in legs/feet.  Gastrointestinal: Positive for constipation and diarrhea (Hx of IBS). Negative for blood in stool.  Endocrine: Negative for increased urination.  Genitourinary: Negative for painful urination.  Musculoskeletal: Positive for myalgias, muscle tenderness and myalgias. Negative for arthralgias, joint pain, joint swelling, muscle weakness and morning stiffness.  Skin: Negative for color change, pallor, rash, hair loss, nodules/bumps, skin tightness, ulcers and sensitivity to sunlight.  Allergic/Immunologic: Negative for susceptible to infections.  Neurological: Negative for dizziness, numbness, headaches and weakness.  Hematological: Negative for swollen glands.  Psychiatric/Behavioral: Positive for sleep disturbance. Negative for depressed mood. The patient is not nervous/anxious.     PMFS History:  Patient Active Problem List   Diagnosis Date Noted  . Intractable pain   . Acute on chronic pancreatitis (HCC) 10/28/2017  . DDD (degenerative disc disease), cervical 07/12/2017  . DDD (degenerative disc disease), lumbar 07/12/2017  . Primary insomnia 05/30/2017  . Other fatigue 05/30/2017  . ANA positive 04/19/2017  . History of chronic sinusitis 04/19/2017  . Fibromyalgia 10/24/2016  . Osteoarthritis of both knees 10/24/2016  . Sjogren's syndrome (HCC) 10/24/2016  . IBS (irritable bowel syndrome) 10/24/2016  . Migraines 10/24/2016  . Chronic sinusitis 10/24/2016  . Pelvic floor dysfunction 10/24/2016  . Vulvar vestibulitis 10/24/2016  . Dehydration 07/11/2016  . Fatty liver 07/11/2016  . Elevated LFTs 07/11/2016  . 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 Abdominal  pain 01/05/2016  . Pancreatitis, acute   . CAP (community acquired pneumonia) 06/19/2014  . Pain management 06/18/2014  . Chronic pancreatitis  (HCC) 06/18/2014  . Intractable migraine without aura 01/25/2014  . Acute pancreatitis 09/25/2013  . Leucocytosis 09/25/2013  . Microcytic anemia 09/25/2013  . Asthma 09/25/2013  . Type 2 diabetes mellitus without complication, with long-term current use of insulin (HCC) 09/25/2013  . Hyponatremia 09/25/2013    Past Medical History:  Diagnosis Date  . Asthma   . Chronic sinusitis   . Diabetes mellitus    Type 1   . Fibromyalgia   . Hyperlipidemia   . IBS (irritable bowel syndrome)   . Migraine without aura, with intractable migraine, so stated, without mention of status migrainosus 01/25/2014  . Migraines   . Osteoarthritis of both knees 10/24/2016   moderate  . Pancreatitis chronic   . Pelvic floor dysfunction 10/24/2016  . Sjogren's syndrome (HCC)   . Vulvar vestibulitis     Family History  Problem Relation Age of Onset  . Asthma Mother   . High Cholesterol Father   . Macular degeneration Father   . Migraines Sister   . Endometriosis Sister   . Asthma Brother   . GER disease Brother   . Pancreatitis Paternal Grandfather   . Pancreatic cancer Other   . Pancreatitis Maternal Uncle    Past Surgical History:  Procedure Laterality Date  . BREAST REDUCTION SURGERY  2010  . DILATION AND CURETTAGE OF UTERUS  2007  . ERCP     Removed stone from Bile duct  . ERCP     with MRI  . PANCREAS SURGERY  1997  . whiple  1997  . whipple's     Social History   Social History Narrative  . Not on file   Immunization History  Administered Date(s) Administered  . Influenza Split 10/08/2018  . Influenza-Unspecified 09/02/2014     Objective: Vital Signs: BP 129/78 (BP Location: Left Arm, Patient Position: Sitting, Cuff Size: Normal)   Pulse 80   Resp 13   Ht 5\' 4"  (1.626 m)   Wt 166 lb 6.4 oz (75.5 kg)   BMI 28.56 kg/m    Physical Exam Vitals signs and nursing note reviewed.  Constitutional:      Appearance: She is well-developed.  HENT:     Head: Normocephalic and  atraumatic.  Eyes:     Conjunctiva/sclera: Conjunctivae normal.  Neck:     Musculoskeletal: Normal range of motion.  Cardiovascular:     Rate and Rhythm: Normal rate and regular rhythm.     Heart sounds: Normal heart sounds.  Pulmonary:     Effort: Pulmonary effort is normal.     Breath sounds: Normal breath sounds.  Abdominal:     General: Bowel sounds are normal.     Palpations: Abdomen is soft.  Lymphadenopathy:     Cervical: No cervical adenopathy.  Skin:    General: Skin is warm and dry.     Capillary Refill: Capillary refill takes less than 2 seconds.  Neurological:     Mental Status: She is alert and oriented to person, place, and time.  Psychiatric:        Behavior: Behavior normal.      Musculoskeletal Exam: C-spine, thoracic spine, lumbar spine good range of motion.  No midline spinal tenderness.  No SI joint tenderness.  Trapezius muscle spasms bilaterally.  Shoulder joints, elbow joints, wrist joints, MCPs, PIPs, DIPs good range of motion with no  synovitis.  She has complete fist formation bilaterally.  Tenderness in the left CMC joint.  Hip joints, knee joints, ankle joints, MTPs, PIPs, DIPs good range of motion no synovitis.  No warmth or effusion bilateral knee joints.  Tenderness over the left trochanteric bursa.  Positive tender points and generalized hyperalgesia.    CDAI Exam: CDAI Score: Not documented Patient Global Assessment: Not documented; Provider Global Assessment: Not documented Swollen: Not documented; Tender: Not documented Joint Exam   Not documented   There is currently no information documented on the homunculus. Go to the Rheumatology activity and complete the homunculus joint exam.  Investigation: No additional findings.  Imaging: No results found.  Recent Labs: Lab Results  Component Value Date   WBC 12.1 (H) 10/29/2017   HGB 8.1 (L) 10/29/2017   PLT 350 10/29/2017   NA 134 (L) 10/30/2017   K 4.2 10/30/2017   CL 104 10/30/2017    CO2 24 10/30/2017   GLUCOSE 165 (H) 10/30/2017   BUN <5 (L) 10/30/2017   CREATININE 0.50 10/30/2017   BILITOT 0.9 10/28/2017   ALKPHOS 77 10/28/2017   AST 21 10/28/2017   ALT 20 10/28/2017   PROT 7.1 10/28/2017   ALBUMIN 3.5 10/28/2017   CALCIUM 8.7 (L) 10/30/2017   GFRAA >60 10/30/2017    Speciality Comments: No specialty comments available.  Procedures:  No procedures performed Allergies: Ciprofloxacin hcl; Nortriptyline hcl; Amitriptyline; Compazine [prochlorperazine maleate]; Cymbalta [duloxetine hcl]; Dexilant [dexlansoprazole]; Doxycycline; Flagyl [metronidazole hcl]; Gabapentin; Penicillins; Penicillins cross reactors; Septra [sulfamethoxazole-trimethoprim]; Tizanidine; Topamax [topiramate]; Toradol [ketorolac tromethamine]; Zofran [ondansetron hcl]; Amoxicillin; Diflucan [fluconazole]; Fluticasone furoate-vilanterol; Inapsine [droperidol]; Ketamine; Levofloxacin; Lyrica [pregabalin]; Phenergan [promethazine hcl]; Phenothiazines; Primaxin [imipenem]; Prozac [fluoxetine hcl]; and Reglan [metoclopramide hcl]   Assessment / Plan:     Visit Diagnoses: Fibromyalgia: She has generalized hyperalgesia and positive tender points on exam.  She has been having more frequent and severe fibromyalgia flares.  She has generalized muscle aches and muscle tenderness at this time.  She is been having trapezius muscle spasms.  She has bilateral trochanter bursitis worse on the left side.  She continues to have chronic fatigue and insomnia.  She has been alternating heat and ice for myalgias as well as using Voltaren gel and CBD cream topically for symptomatic relief.  She is been unable to exercise recently due to recurrent bouts of pancreatitis.  She has been going to physical therapy once a week which has been helping with muscle strengthening and range of motion.  The importance of regular exercise and good sleep hygiene were discussed.  She will follow up in 6 months.  Other fatigue: She has chronic  fatigue that has been worsening over the past several months.  She been unable to exercise due to recurrent pancreatitis.  Primary insomnia: She continues to have chronic insomnia.  Good sleep hygiene was discussed.  Primary osteoarthritis of both knees: No warmth or effusion.  She is good range of motion.  She has discomfort in the left knee joint intermittently.  She has been going to physical therapy and working muscle strengthening.  She is is Voltaren gel as well as ice for symptomatic relief.  Sjogren's syndrome with other organ involvement (HCC) - Negative ANA, positive SSB: She has chronic sicca symptoms.  She has no parotid swelling or tenderness on exam.  She did not tolerate using Biotene products in the past.  She uses ginger lozenges as well as trying to drink more fluids recently.  DDD (degenerative disc disease), cervical:  She has good range of motion with no discomfort.  She has no symptoms of radiculopathy at this time.  She has trapezius muscle spasms bilaterally.  DDD (degenerative disc disease), lumbar: Chronic pain.  She has been alternating use of a heating pad and ice for symptomatic relief.  She has also been using CBD cream topically which has been providing some relief.  Greater trochanteric bursitis of both hips: She has mild tenderness over the right trochanteric bursa and increased tenderness over the left trochanteric bursa.  She has been performing stretching exercises on a regular basis.  Other medical conditions are listed as follows:  Fatty liver  History of migraine  Pelvic floor dysfunction  History of pancreatitis - Hereditary, followed up at [facility]: She has been having recurrent bouts of pancreatitis.  History of diabetes mellitus  History of IBS   Orders: No orders of the defined types were placed in this encounter.  No orders of the defined types were placed in this encounter.   Face-to-face time spent with patient was 30 minutes.  Patient  continues to have severe discomfort from fibromyalgia.  She states she has been having frequent flares.  She also has arthritis in her knee joints which causes discomfort.  She has sicca symptoms.  On my examination she had no synovitis today.  She had tenderness over bilateral trochanteric bursa for which IT band exercises were explained and discussed.  Greater than 50% of time was spent in counseling and coordination of care.  Follow-Up Instructions: Return in about 6 months (around 08/13/2019) for Fibromyalgia, Osteoarthritis, Sjogren's syndrome, DDD.   Gearldine Bienenstock, PA-C I examined and evaluated the patient with Sherron Ales PA. The plan of care was discussed as noted above.  Pollyann Savoy, MD Note - This record has been created using Animal nutritionist.  Chart creation errors have been sought, but may not always  have been located. Such creation errors do not reflect on  the standard of medical care.

## 2019-02-10 ENCOUNTER — Ambulatory Visit: Payer: Managed Care, Other (non HMO) | Admitting: Rheumatology

## 2019-02-10 ENCOUNTER — Encounter: Payer: Self-pay | Admitting: Rheumatology

## 2019-02-10 VITALS — BP 129/78 | HR 80 | Resp 13 | Ht 64.0 in | Wt 166.4 lb

## 2019-02-10 DIAGNOSIS — M5136 Other intervertebral disc degeneration, lumbar region: Secondary | ICD-10-CM

## 2019-02-10 DIAGNOSIS — Z8669 Personal history of other diseases of the nervous system and sense organs: Secondary | ICD-10-CM

## 2019-02-10 DIAGNOSIS — M5030 Other cervical disc degeneration, unspecified cervical region: Secondary | ICD-10-CM

## 2019-02-10 DIAGNOSIS — R5383 Other fatigue: Secondary | ICD-10-CM

## 2019-02-10 DIAGNOSIS — M7062 Trochanteric bursitis, left hip: Secondary | ICD-10-CM

## 2019-02-10 DIAGNOSIS — M797 Fibromyalgia: Secondary | ICD-10-CM

## 2019-02-10 DIAGNOSIS — M170 Bilateral primary osteoarthritis of knee: Secondary | ICD-10-CM

## 2019-02-10 DIAGNOSIS — K760 Fatty (change of) liver, not elsewhere classified: Secondary | ICD-10-CM

## 2019-02-10 DIAGNOSIS — M51369 Other intervertebral disc degeneration, lumbar region without mention of lumbar back pain or lower extremity pain: Secondary | ICD-10-CM

## 2019-02-10 DIAGNOSIS — Z8719 Personal history of other diseases of the digestive system: Secondary | ICD-10-CM

## 2019-02-10 DIAGNOSIS — M7061 Trochanteric bursitis, right hip: Secondary | ICD-10-CM

## 2019-02-10 DIAGNOSIS — M3509 Sicca syndrome with other organ involvement: Secondary | ICD-10-CM

## 2019-02-10 DIAGNOSIS — Z8639 Personal history of other endocrine, nutritional and metabolic disease: Secondary | ICD-10-CM

## 2019-02-10 DIAGNOSIS — F5101 Primary insomnia: Secondary | ICD-10-CM | POA: Diagnosis not present

## 2019-02-10 DIAGNOSIS — M6289 Other specified disorders of muscle: Secondary | ICD-10-CM

## 2019-05-21 NOTE — Telephone Encounter (Signed)
TN please advise since BQ is not in the office. Thanks.

## 2019-05-22 MED ORDER — FLOVENT HFA 110 MCG/ACT IN AERO: 2.0000 | INHALATION_SPRAY | Freq: Two times a day (BID) | RESPIRATORY_TRACT | 3 refills | Status: DC

## 2019-05-22 NOTE — Telephone Encounter (Signed)
E-mail sent to patient with [facility] NP's recommendations.  Rx for Flovent HFA 110 2puffs twice daily sent to pharmacy on file.  Received an alert from Epic stating there is an allergy/contraindication for Fluticasone Furoate-Vilanterol.  Per Kenney Houseman NP: [05/22/2019 3:47 PM]  Heather Wagner:   Yes. It is ok to send. Thanks.    Rx sent Nothing further needed; will sign off

## 2019-05-22 NOTE — Telephone Encounter (Signed)
May trial flovent hfa 110, 2 puffs twice daily. Please order. May stop qvar. Thanks.

## 2019-06-08 ENCOUNTER — Telehealth: Payer: Self-pay | Admitting: Pulmonary Disease

## 2019-06-08 NOTE — Telephone Encounter (Signed)
See telephone note from 06/08/2019. I called & spoke to pt about this already. Pt has an appt scheduled for tomorrow 06/09/2019 at 4:30 PM w/ TP. Nothing further needed at this time.

## 2019-06-08 NOTE — Telephone Encounter (Signed)
Called & spoke w/ pt. Pt states she would like to be seen as her asthma has been worse. I offered pt appt w/ an APP today, which pt declined d/t pancreas pain from her chronic pancreatitis. Pt states tomorrow 06/09/2019 in the afternoon would be ideal as her husband will have to bring her. I offered a 4:30 PM appt w/ TP, which pt agreed to. Pt is negative for COVID screen. She is aware of [facility]'s mask and visitor policy.   Appt has been scheduled for tomorrow 06/09/2019 at 4:30 PM. Nothing further needed at this time.

## 2019-06-09 ENCOUNTER — Ambulatory Visit: Payer: Managed Care, Other (non HMO) | Admitting: Adult Health

## 2019-07-28 NOTE — Progress Notes (Deleted)
Office Visit Note  Patient: Heather FlingRebekah Wagner             Date of Birth: December 25, 1975           MRN: [id number]             PCP: Maurice SmallGriffin, Elaine, MD Referring: Maurice SmallGriffin, Elaine, MD Visit Date: 08/11/2019 Occupation: @GUAROCC @  Subjective:  No chief complaint on file.   History of Present Illness: Heather FlingRebekah Wagner is a 43 y.o. female ***   Activities of Daily Living:  Patient reports morning stiffness for *** {minute/hour:19697}.   Patient {ACTIONS;DENIES/REPORTS:21021675::"Denies"} nocturnal pain.  Difficulty dressing/grooming: {ACTIONS;DENIES/REPORTS:21021675::"Denies"} Difficulty climbing stairs: {ACTIONS;DENIES/REPORTS:21021675::"Denies"} Difficulty getting out of chair: {ACTIONS;DENIES/REPORTS:21021675::"Denies"} Difficulty using hands for taps, buttons, cutlery, and/or writing: {ACTIONS;DENIES/REPORTS:21021675::"Denies"}  No Rheumatology ROS completed.   PMFS History:  Patient Active Problem List   Diagnosis Date Noted  . Intractable pain   . Acute on chronic pancreatitis (HCC) 10/28/2017  . DDD (degenerative disc disease), cervical 07/12/2017  . DDD (degenerative disc disease), lumbar 07/12/2017  . Primary insomnia 05/30/2017  . Other fatigue 05/30/2017  . ANA positive 04/19/2017  . History of chronic sinusitis 04/19/2017  . Fibromyalgia 10/24/2016  . Osteoarthritis of both knees 10/24/2016  . Sjogren's syndrome (HCC) 10/24/2016  . IBS (irritable bowel syndrome) 10/24/2016  . Migraines 10/24/2016  . Chronic sinusitis 10/24/2016  . Pelvic floor dysfunction 10/24/2016  . Vulvar vestibulitis 10/24/2016  . Dehydration 07/11/2016  . Fatty liver 07/11/2016  . Elevated LFTs 07/11/2016  . 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 Abdominal pain 01/05/2016  . Pancreatitis, acute   . CAP (community acquired pneumonia) 06/19/2014  . Pain management 06/18/2014  . Chronic pancreatitis (HCC) 06/18/2014  . Intractable migraine without aura 01/25/2014  . Acute  pancreatitis 09/25/2013  . Leucocytosis 09/25/2013  . Microcytic anemia 09/25/2013  . Asthma 09/25/2013  . Type 2 diabetes mellitus without complication, with long-term current use of insulin (HCC) 09/25/2013  . Hyponatremia 09/25/2013    Past Medical History:  Diagnosis Date  . Asthma   . Chronic sinusitis   . Diabetes mellitus    Type 1   . Fibromyalgia   . Hyperlipidemia   . IBS (irritable bowel syndrome)   . Migraine without aura, with intractable migraine, so stated, without mention of status migrainosus 01/25/2014  . Migraines   . Osteoarthritis of both knees 10/24/2016   moderate  . Pancreatitis chronic   . Pelvic floor dysfunction 10/24/2016  . Sjogren's syndrome (HCC)   . Vulvar vestibulitis     Family History  Problem Relation Age of Onset  . Asthma Mother   . High Cholesterol Father   . Macular degeneration Father   . Migraines Sister   . Endometriosis Sister   . Asthma Brother   . GER disease Brother   . Pancreatitis Paternal Grandfather   . Pancreatic cancer Other   . Pancreatitis Maternal Uncle    Past Surgical History:  Procedure Laterality Date  . BREAST REDUCTION SURGERY  2010  . DILATION AND CURETTAGE OF UTERUS  2007  . ERCP     Removed stone from Bile duct  . ERCP     with MRI  . PANCREAS SURGERY  1997  . whiple  1997  . whipple's     Social History   Social History Narrative  . Not on file   Immunization History  Administered Date(s) Administered  . Influenza Split 10/08/2018  . Influenza-Unspecified 09/02/2014     Objective: Vital Signs: There were  no vitals taken for this visit.   Physical Exam   Musculoskeletal Exam: ***  CDAI Exam: CDAI Score: - Patient Global: -; Provider Global: - Swollen: -; Tender: - Joint Exam   No joint exam has been documented for this visit   There is currently no information documented on the homunculus. Go to the Rheumatology activity and complete the homunculus joint exam.  Investigation:  No additional findings.  Imaging: No results found.  Recent Labs: Lab Results  Component Value Date   WBC 12.1 (H) 10/29/2017   HGB 8.1 (L) 10/29/2017   PLT 350 10/29/2017   NA 134 (L) 10/30/2017   K 4.2 10/30/2017   CL 104 10/30/2017   CO2 24 10/30/2017   GLUCOSE 165 (H) 10/30/2017   BUN <5 (L) 10/30/2017   CREATININE 0.50 10/30/2017   BILITOT 0.9 10/28/2017   ALKPHOS 77 10/28/2017   AST 21 10/28/2017   ALT 20 10/28/2017   PROT 7.1 10/28/2017   ALBUMIN 3.5 10/28/2017   CALCIUM 8.7 (L) 10/30/2017   GFRAA >60 10/30/2017    Speciality Comments: No specialty comments available.  Procedures:  No procedures performed Allergies: Ciprofloxacin hcl, Nortriptyline hcl, Amitriptyline, Compazine [prochlorperazine maleate], Cymbalta [duloxetine hcl], Dexilant [dexlansoprazole], Doxycycline, Flagyl [metronidazole hcl], Gabapentin, Penicillins, Penicillins cross reactors, Septra [sulfamethoxazole-trimethoprim], Tizanidine, Topamax [topiramate], Toradol [ketorolac tromethamine], Zofran [ondansetron hcl], Amoxicillin, Diflucan [fluconazole], Fluticasone furoate-vilanterol, Inapsine [droperidol], Ketamine, Levofloxacin, Lyrica [pregabalin], Phenergan [promethazine hcl], Phenothiazines, Primaxin [imipenem], Prozac [fluoxetine hcl], and Reglan [metoclopramide hcl]   Assessment / Plan:     Visit Diagnoses: No diagnosis found.  Orders: No orders of the defined types were placed in this encounter.  No orders of the defined types were placed in this encounter.   Face-to-face time spent with patient was *** minutes. Greater than 50% of time was spent in counseling and coordination of care.  Follow-Up Instructions: No follow-ups on file.   Ofilia Neas, PA-C  Note - This record has been created using Dragon software.  Chart creation errors have been sought, but may not always  have been located. Such creation errors do not reflect on  the standard of medical care.

## 2019-08-11 ENCOUNTER — Ambulatory Visit: Payer: Self-pay | Admitting: Physician Assistant

## 2019-09-16 ENCOUNTER — Other Ambulatory Visit: Payer: Self-pay | Admitting: Nurse Practitioner

## 2019-09-21 ENCOUNTER — Ambulatory Visit: Payer: Self-pay | Admitting: Adult Health

## 2019-09-28 ENCOUNTER — Ambulatory Visit: Payer: Self-pay | Admitting: Adult Health

## 2019-10-02 ENCOUNTER — Ambulatory Visit: Payer: Self-pay | Admitting: Adult Health

## 2019-10-14 ENCOUNTER — Encounter: Payer: Self-pay | Admitting: Adult Health

## 2019-10-14 ENCOUNTER — Ambulatory Visit: Payer: Managed Care, Other (non HMO) | Admitting: Adult Health

## 2019-10-14 ENCOUNTER — Other Ambulatory Visit: Payer: Self-pay

## 2019-10-14 DIAGNOSIS — J4540 Moderate persistent asthma, uncomplicated: Secondary | ICD-10-CM

## 2019-10-14 MED ORDER — FLOVENT HFA 110 MCG/ACT IN AERO: 2.0000 | INHALATION_SPRAY | Freq: Two times a day (BID) | RESPIRATORY_TRACT | 5 refills | Status: DC

## 2019-10-14 NOTE — Progress Notes (Signed)
@Patient  ID: Heather Wagner, female    DOB: Jul 14, 1976, 43 y.o.   MRN: [id number]  Chief Complaint  Patient presents with  . Follow-up    Asthma     Referring provider: Kelton Pillar, MD  HPI: 43 year old female never smoker followed for asthma and allergic rhinitis Medical history significant for chronic pancreatitis and Sjogren's disease, DM   TEST/EVENTS :  December 2017 ratio 77%, FEV1 2.81 L 90% predicted, 13% change post bronchodilator correlating to 330 mL change, FVC 3.62 L 95% predicted, total lung capacity 5.15 L 98% predicted, DLCO 18.56, 72% predicted  Exhaled NO: 01/2016 20 ppm  10/14/2019 Follow up : Asthma and AR Patient presents for a 1 year follow-up.  Patient is followed for mild persistent asthma and allergic rhinitis.  Patient says overall her breathing has not been doing as well over last year. Feels hot humid weather caused her more symptoms with tightness and dyspnea. Changed from QVAR to Flovent which has helped some . Has tried ICS/LABA in past but can not tolerate . Has multiple drug allergies and intolerances. No increased cough or wheezing . Does use Albuterol but not recently  No chest pain, orthopnea, edema or fever. No sinus draiinage. GERD controlled on rx.  Suffers with chronic pancreatitis and frequent flares.  Denies pets . No basement. Removed carpet in home. Has air purifier.   Allergies  Allergen Reactions  . Ciprofloxacin Hcl Other (See Comments)    Flare up of pancreatitis, severe yeast infection   . Nortriptyline Hcl Shortness Of Breath    Swelling anxiety  . Amitriptyline Other (See Comments)    sedation  . Compazine [Prochlorperazine Maleate] Other (See Comments)    Neurological - uncontrollable eye movements  . Cymbalta [Duloxetine Hcl] Nausea And Vomiting    insomnia  . Dexilant [Dexlansoprazole] Nausea Only  . Doxycycline Nausea And Vomiting  . Flagyl [Metronidazole Hcl] Nausea And Vomiting    Skin rash  . Gabapentin  Nausea And Vomiting and Other (See Comments)    Agitation  . Penicillins   . Penicillins Cross Reactors Nausea And Vomiting    Skin rash/hives  . Septra [Sulfamethoxazole-Trimethoprim] Other (See Comments)    Severe upset stomach, nausea, stomach pain, over heating.   . Tizanidine Nausea And Vomiting and Other (See Comments)    Agitation  . Topamax [Topiramate] Other (See Comments)    Mood change, anxiety, headaches  . Toradol [Ketorolac Tromethamine] Other (See Comments)    Anxiety.  [name]     Headaches   . Amoxicillin Nausea And Vomiting and Anxiety    Has patient had a PCN reaction causing immediate rash, facial/tongue/throat swelling, SOB or lightheadedness with hypotension:  Has patient had a PCN reaction causing severe rash involving mucus membranes or skin necrosis:  Has patient had a PCN reaction that required hospitalization  Has patient had a PCN reaction occurring within the last 10 years:  If all of the above answers are "NO", then may proceed with Cephalosporin use.   [name] Anxiety, Other (See Comments) and Palpitations  . Diflucan [Fluconazole] Diarrhea and Palpitations    Stomach pain, pancreatitis flare up, severe headache   . Inapsine [Droperidol] Nausea And Vomiting  . Ketamine Anxiety    Agitation, insomnia   . Levofloxacin Palpitations and Other (See Comments)    Light headed, anxiety, blood rushing, shaking, sweating.l   . Lyrica [Pregabalin] Other (See Comments)    Mood changes- become listless, disinterested  .  Phenergan [Promethazine Hcl] Anxiety  . Phenothiazines Hives, Nausea And Vomiting and Rash  . Primaxin [Imipenem] Nausea And Vomiting  . Prozac [Fluoxetine Hcl] Anxiety  . Reglan [Metoclopramide Hcl] Anxiety    Immunization History  Administered Date(s) Administered  . Influenza Split 10/08/2018  . Influenza-Unspecified 09/02/2014    Past Medical History:  Diagnosis Date  .  Asthma   . Chronic sinusitis   . Diabetes mellitus    Type 1   . Fibromyalgia   . Hyperlipidemia   . IBS (irritable bowel syndrome)   . Migraine without aura, with intractable migraine, so stated, without mention of status migrainosus 01/25/2014  . Migraines   . Osteoarthritis of both knees 10/24/2016   moderate  . Pancreatitis chronic   . Pelvic floor dysfunction 10/24/2016  . Sjogren's syndrome (HCC)   . Vulvar vestibulitis     Tobacco History: Social History   Tobacco Use  Smoking Status Never Smoker  Smokeless Tobacco Never Used   Counseling given: Not Answered   Outpatient Medications Prior to Visit  Medication Sig Dispense Refill  . albuterol (PROVENTIL HFA;VENTOLIN HFA) 108 (90 BASE) MCG/ACT inhaler Inhale 1-2 puffs into the lungs every 6 (six) hours as needed for wheezing. Asthma    . calcium carbonate (TUMS EX) 750 MG chewable tablet Chew 2-4 tablets by mouth 2 (two) times daily as needed for heartburn. No more than 10 a day    . carboxymethylcellulose (REFRESH PLUS) 0.5 % SOLN 1-2 drops every 2 (two) hours as needed for Dry eyes    . diclofenac sodium (VOLTAREN) 1 % GEL APPLY 3 GRAMS TO 3 LARGE JOINTS UP TO 3 TIMES DAILY AS NEEDED 300 g 3  . HYDROcodone-acetaminophen (NORCO/VICODIN) 5-325 MG per tablet Take 1-2 tablets by mouth every 6 (six) hours as needed for moderate pain.     Marland Kitchen. ibuprofen (ADVIL,MOTRIN) 200 MG tablet Take 400-800 mg by mouth every 8 (eight) hours as needed for moderate pain.     . Insulin Glargine (BASAGLAR KWIKPEN) 100 UNIT/ML SOPN Inject 22 Units into the skin 2 (two) times daily.     . INSULIN LISPRO 100 UNIT/ML KwikPen Junior 1-5 Units. 5-6 times daily with meals and extra if needed, injecting 1 unit for every 15 carbs, 1 unit every 50 over 150.    . Magnesium 300 MG CAPS Take 300 mg by mouth every evening.     . Melatonin 5 MG TABS Take 1 tablet by mouth at bedtime.    Marland Kitchen. MICROLET LANCETS MISC AS DIRECTED FOUR TIMES A DAY 30 DAYS  2  . Multiple  Vitamins-Minerals (ALIVE WOMENS GUMMY PO) Take 2 tablets by mouth daily after breakfast.     . omeprazole (PRILOSEC) 20 MG capsule Take 20 mg by mouth every morning.     Marland Kitchen. OVER THE COUNTER MEDICATION daily.    . polyethylene glycol (MIRALAX / GLYCOLAX) packet Take 17 g by mouth daily as needed for mild constipation.    . Probiotic Product (PROBIOTIC DAILY PO) Take 1 capsule by mouth daily. Reported on 01/16/2016    . rizatriptan (MAXALT) 10 MG tablet Take 10 mg by mouth as needed for migraine. May repeat in 2 hours if needed for headaches    . ZENPEP 25000 units CPEP Take 1-3 capsules by mouth 3 (three) times daily with meals as needed. 3-4 caps per meal and 1-2 caps per snack  11  . fluticasone (FLOVENT HFA) 110 MCG/ACT inhaler Inhale 2 puffs into the lungs 2 (two) times  daily. 1 Inhaler 3  . BAYER CONTOUR NEXT TEST test strip USE TO CHECK BLOOD SUGAR 4X A DAY (B08.9)  5  . OVER THE COUNTER MEDICATION Take 1 Dose by mouth at bedtime as needed (insomnia). Valarian/liqourice root tea    . Beclomethasone Diprop HFA (QVAR REDIHALER) 80 MCG/ACT AERB Inhale 2 puffs into the lungs daily. 1 Inhaler 0   No facility-administered medications prior to visit.      Review of Systems:   Constitutional:   No  weight loss, night sweats,  Fevers, chills, fatigue, or  lassitude.  HEENT:   No headaches,  Difficulty swallowing,  Tooth/dental problems, or  Sore throat,                No sneezing, itching, ear ache, nasal congestion, post nasal drip,   CV:  No chest pain,  Orthopnea, PND, swelling in lower extremities, anasarca, dizziness, palpitations, syncope.   GI  No heartburn, indigestion,  diarrhea, change in bowel habits, loss of appetite, bloody stools. +abd pain with pancreatitis   Resp:   No chest wall deformity  Skin: no rash or lesions.  GU: no dysuria, change in color of urine, no urgency or frequency.  No flank pain, no hematuria   MS:  No joint pain or swelling.  No decreased range of  motion.  No back pain.    Physical Exam  BP 124/78 (BP Location: Left Arm, Cuff Size: Normal)   Pulse 84   Temp 98 F (36.7 C) (Temporal)   Ht 5' 4.76" (1.645 m)   Wt 169 lb (76.7 kg)   SpO2 98%   BMI 28.33 kg/m   GEN: A/Ox3; pleasant , NAD, well nourished    HEENT:  [place]/AT,    NOSE-clear, THROAT-clear, no lesions, no postnasal drip or exudate noted.   NECK:  Supple w/ fair ROM; no JVD; normal carotid impulses w/o bruits; no thyromegaly or nodules palpated; no lymphadenopathy.    RESP  Clear  P & A; w/o, wheezes/ rales/ or rhonchi. no accessory muscle use, no dullness to percussion  CARD:  RRR, no m/r/g, no peripheral edema, pulses intact, no cyanosis or clubbing.  GI:   Soft & nt; nml bowel sounds; no organomegaly or masses detected.   Musco: Warm bil, no deformities or joint swelling noted.   Neuro: alert, no focal deficits noted.    Skin: Warm, no lesions or rashes    Lab Results:   BMET   BNP No results found for: BNP  ProBNP No results found for: PROBNP  Imaging: No results found.    PFT Results Latest Ref Rng & Units 11/19/2016  FVC-Pre L 3.45  FVC-Predicted Pre % 90  FVC-Post L 3.62  FVC-Predicted Post % 95  Pre FEV1/FVC % % 71  Post FEV1/FCV % % 77  FEV1-Pre L 2.47  FEV1-Predicted Pre % 79  FEV1-Post L 2.81  DLCO UNC% % 72  DLCO COR %Predicted % 79  TLC L 5.15  TLC % Predicted % 98  RV % Predicted % 80    Lab Results  Component Value Date   NITRICOXIDE 20 01/16/2016        Assessment & Plan:   No problem-specific Assessment & Plan notes found for this encounter.     Rubye Oaks, NP 10/14/2019

## 2019-10-14 NOTE — Assessment & Plan Note (Signed)
Moderate persistent Asthma -increased symptoms over last year.  Check spiromertry and FENO  Discussed adding additional meds such as antihistamine /LABA or increased ICS dose , she says she can not tolerate.   Plan  Patient Instructions  Continue on Flovent 2 puffs Twice daily  , rinse after use.  Albuterol inhaler 2 puffs every 6hrs as needed for wheezing , this is your rescue inhaler.  Set up for Spirometry and FENO clinic in next couple of weeks.  Activity as tolerated .  Follow up with Dr. Lamonte Sakai  In 6 months and As needed

## 2019-10-14 NOTE — Patient Instructions (Addendum)
Continue on Flovent 2 puffs Twice daily  , rinse after use.  Albuterol inhaler 2 puffs every 6hrs as needed for wheezing , this is your rescue inhaler.  Set up for Spirometry and FENO clinic in next couple of weeks.  Activity as tolerated .  Follow up with Dr. Lamonte Sakai  In 6 months and As needed

## 2019-12-15 ENCOUNTER — Ambulatory Visit
Admission: RE | Admit: 2019-12-15 | Discharge: 2019-12-15 | Disposition: A | Discharge status: Home / Self Care | Payer: Managed Care, Other (non HMO) | Source: Ambulatory Visit | Attending: Family Medicine | Admitting: Family Medicine

## 2019-12-15 ENCOUNTER — Other Ambulatory Visit: Payer: Self-pay | Admitting: Family Medicine

## 2019-12-15 DIAGNOSIS — R079 Chest pain, unspecified: Secondary | ICD-10-CM

## 2020-01-08 ENCOUNTER — Telehealth: Payer: Self-pay | Admitting: *Deleted

## 2020-01-08 NOTE — Telephone Encounter (Signed)
Received lab results from [facility] on 01/06/20  Reviewed by Dr. Corliss Skains   Glucose 196  Creat. 0.59 Sodium 134 Hgb A1C 8.1 Cholesterol 209 Calc LDL 209 Non HDL 146 MA/CR Ratio <36.8 C- Peptide 0.9

## 2020-01-11 ENCOUNTER — Ambulatory Visit: Payer: Managed Care, Other (non HMO) | Admitting: Rheumatology

## 2020-01-27 NOTE — Progress Notes (Signed)
Office Visit Note  Patient: Heather Wagner             Date of Birth: 1976-06-08           MRN: [id number]             PCP: Maurice Small, MD Referring: Maurice Small, MD Visit Date: 02/02/2020 Occupation: @GUAROCC @  Subjective:  Left hip pain   History of Present Illness: Heather Wagner is a 44 y.o. female with history of fibromyalgia, Sjogren's, and DDD.  Patient presents today with increased left hip pain.  She states the pain has been severe at times and she is having difficulty with range of motion.  She has been going to physical therapy once a week but said persistent discomfort.  She states that the pain feels different than her typical left trochanter bursitis.  She has tried ice, heat, Voltaren gel, and CBD oil without much relief.  She has also been performing stretching exercises but has difficulty due to discomfort she is experiencing.  She states that over the past 1 month she has been having frequent and severe fibromyalgia flares.  She states she is been under tremendous amount of stress due to her mother and mother-in-law being diagnosed with cancer in November 2020.  She states that since October she has had recurrent bouts of costochondritis.  She has tried applying heat and massage without much relief.  She cannot tolerate taking NSAIDs due to her recurrent gastritis and pancreatitis.  She takes Norco as needed for pain relief  Activities of Daily Living:  Patient reports morning stiffness for 2 hours.   Patient Reports nocturnal pain.  Difficulty dressing/grooming: Reports Difficulty climbing stairs: Reports Difficulty getting out of chair: Reports Difficulty using hands for taps, buttons, cutlery, and/or writing: Denies  Review of Systems  Constitutional: Positive for fatigue.  HENT: Positive for mouth dryness. Negative for mouth sores and nose dryness.   Eyes: Positive for dryness. Negative for pain and visual disturbance.  Respiratory: Negative for  cough, hemoptysis and difficulty breathing.   Cardiovascular: Negative for chest pain, palpitations, hypertension and swelling in legs/feet.  Gastrointestinal: Positive for constipation and diarrhea. Negative for blood in stool.  Endocrine: Negative for increased urination.  Genitourinary: Negative for difficulty urinating and painful urination.  Musculoskeletal: Positive for arthralgias, joint pain, joint swelling, morning stiffness and muscle tenderness. Negative for myalgias, muscle weakness and myalgias.  Skin: Negative for color change, pallor, rash, hair loss, nodules/bumps, skin tightness, ulcers and sensitivity to sunlight.  Neurological: Negative for dizziness, numbness, headaches and weakness.  Hematological: Negative for swollen glands.  Psychiatric/Behavioral: Positive for sleep disturbance. Negative for depressed mood. The patient is not nervous/anxious.     PMFS History:  Patient Active Problem List   Diagnosis Date Noted  . Intractable pain   . Acute on chronic pancreatitis (HCC) 10/28/2017  . DDD (degenerative disc disease), cervical 07/12/2017  . DDD (degenerative disc disease), lumbar 07/12/2017  . Primary insomnia 05/30/2017  . Other fatigue 05/30/2017  . ANA positive 04/19/2017  . History of chronic sinusitis 04/19/2017  . Fibromyalgia 10/24/2016  . Osteoarthritis of both knees 10/24/2016  . Sjogren's syndrome (HCC) 10/24/2016  . IBS (irritable bowel syndrome) 10/24/2016  . Migraines 10/24/2016  . Chronic sinusitis 10/24/2016  . Pelvic floor dysfunction 10/24/2016  . Vulvar vestibulitis 10/24/2016  . Dehydration 07/11/2016  . Fatty liver 07/11/2016  . Elevated LFTs 07/11/2016  . 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 Abdominal pain 01/05/2016  . Pancreatitis, acute   .  CAP (community acquired pneumonia) 06/19/2014  . Pain management 06/18/2014  . Chronic pancreatitis (HCC) 06/18/2014  . Intractable migraine without aura 01/25/2014  . Acute  pancreatitis 09/25/2013  . Leucocytosis 09/25/2013  . Microcytic anemia 09/25/2013  . Asthma 09/25/2013  . Type 2 diabetes mellitus without complication, with long-term current use of insulin (HCC) 09/25/2013  . Hyponatremia 09/25/2013    Past Medical History:  Diagnosis Date  . Asthma   . Bursitis   . Chronic sinusitis   . DDD (degenerative disc disease), cervical   . DDD (degenerative disc disease), lumbar   . Diabetes mellitus    Type 1   . Fibromyalgia   . Hyperlipidemia   . IBS (irritable bowel syndrome)   . Migraine without aura, with intractable migraine, so stated, without mention of status migrainosus 01/25/2014  . Migraines   . Osteoarthritis of both knees 10/24/2016   moderate  . Pancreatitis chronic   . Pelvic floor dysfunction 10/24/2016  . Sjogren's syndrome (HCC)   . Vulvar vestibulitis     Family History  Problem Relation Age of Onset  . Asthma Mother   . High Cholesterol Father   . Macular degeneration Father   . Migraines Sister   . Endometriosis Sister   . Asthma Brother   . GER disease Brother   . Pancreatitis Paternal Grandfather   . Pancreatic cancer Other   . Pancreatitis Maternal Uncle    Past Surgical History:  Procedure Laterality Date  . BREAST REDUCTION SURGERY  2010  . DILATION AND CURETTAGE OF UTERUS  2007  . ERCP     Removed stone from Bile duct  . ERCP     with MRI  . PANCREAS SURGERY  1997  . whiple  1997  . whipple's     Social History   Social History Narrative  . Not on file   Immunization History  Administered Date(s) Administered  . Influenza Split 10/08/2018  . Influenza-Unspecified 09/02/2014     Objective: Vital Signs: BP 137/75 (BP Location: Left Arm, Patient Position: Sitting, Cuff Size: Normal)   Pulse 89   Resp 16   Ht 5\' 4"  (1.626 m)   Wt 172 lb 3.2 oz (78.1 kg)   BMI 29.56 kg/m    Physical Exam Vitals and nursing note reviewed.  Constitutional:      Appearance: She is well-developed.  HENT:      Head: Normocephalic and atraumatic.  Eyes:     Conjunctiva/sclera: Conjunctivae normal.  Pulmonary:     Effort: Pulmonary effort is normal.  Abdominal:     General: Bowel sounds are normal.     Palpations: Abdomen is soft.  Musculoskeletal:     Cervical back: Normal range of motion.  Lymphadenopathy:     Cervical: No cervical adenopathy.  Skin:    General: Skin is warm and dry.     Capillary Refill: Capillary refill takes less than 2 seconds.  Neurological:     Mental Status: She is alert and oriented to person, place, and time.  Psychiatric:        Behavior: Behavior normal.      Musculoskeletal Exam: Generalized hyperalgesia and positive tender points on exam.  C-spine, thoracic spine, lumbar spine good range of motion.  No midline spinal tenderness.  Shoulder joints, elbow joints, wrist joints, MCPs, PIPs, DIPs good range of motion with no synovitis.  She has complete fist formation bilaterally.  Left hip has limited range of motion with discomfort.  Tenderness over the  left trochanter bursa.  Right hip has good range of motion with no discomfort at this time.  Knee joints have good range of motion no warmth or effusion.  She has bilateral knee crepitus.  Ankle joints have good range of motion with no tenderness or synovitis.  CDAI Exam: CDAI Score: -- Patient Global: --; Provider Global: -- Swollen: --; Tender: -- Joint Exam 02/02/2020   No joint exam has been documented for this visit   There is currently no information documented on the homunculus. Go to the Rheumatology activity and complete the homunculus joint exam.  Investigation: No additional findings.  Imaging: XR HIP UNILAT W OR W/O PELVIS 2-3 VIEWS LEFT  Result Date: 02/02/2020 No hip joint narrowing was noted.  No SI joint changes were noted. Impression: Unremarkable x-ray of the hip joint.   Recent Labs: Lab Results  Component Value Date   WBC 12.1 (H) 10/29/2017   HGB 8.1 (L) 10/29/2017   PLT 350  10/29/2017   NA 134 (L) 10/30/2017   K 4.2 10/30/2017   CL 104 10/30/2017   CO2 24 10/30/2017   GLUCOSE 165 (H) 10/30/2017   BUN <5 (L) 10/30/2017   CREATININE 0.50 10/30/2017   BILITOT 0.9 10/28/2017   ALKPHOS 77 10/28/2017   AST 21 10/28/2017   ALT 20 10/28/2017   PROT 7.1 10/28/2017   ALBUMIN 3.5 10/28/2017   CALCIUM 8.7 (L) 10/30/2017   GFRAA >60 10/30/2017    Speciality Comments: No specialty comments available.  Procedures:  No procedures performed Allergies: Ciprofloxacin hcl, Nortriptyline hcl, Amitriptyline, Compazine [prochlorperazine maleate], Cymbalta [duloxetine hcl], Dexilant [dexlansoprazole], Doxycycline, Flagyl [metronidazole hcl], Gabapentin, Penicillins, Penicillins cross reactors, Septra [sulfamethoxazole-trimethoprim], Tizanidine, Topamax [topiramate], Toradol [ketorolac tromethamine], Zofran [ondansetron hcl], Amoxicillin, Breo ellipta [fluticasone furoate-vilanterol], Diflucan [fluconazole], Inapsine [droperidol], Ketamine, Levofloxacin, Lyrica [pregabalin], Phenergan [promethazine hcl], Phenothiazines, Primaxin [imipenem], Prozac [fluoxetine hcl], and Reglan [metoclopramide hcl]   Assessment / Plan:     Visit Diagnoses: Fibromyalgia: She continues to have generalized hyperalgesia and positive tender points on exam.  She has generalized muscle aches muscle tenderness due to fibromyalgia.  She has been experiencing costochondritis for the past 5 months.  She has tried alternating ice and heat and performing stretching exercises.  She cannot tolerate taking NSAIDs or steroids due to GI side effects.  She has been going to physical therapy once a week.  She continues to have trochanter bursitis bilaterally.  She is encouraged to perform stretching exercises daily.  She is going to work with her physical therapy and stretching exercises and the use of ultrasound for symptomatic relief.  She continues to take Norco for pain relief.  Discussed the importance of regular  exercise and good sleep hygiene.  She will follow-up in the office in 6 months.  Other fatigue: She has chronic fatigue related to insomnia.  Primary insomnia: She has difficulty sleeping at night due to discomfort she experiences.  Primary osteoarthritis of both knees: She has good range of motion of both knee joints.  No warmth or effusion was noted.  She has bilateral knee crepitus.  Sjogren's syndrome with other organ involvement ([place]) - Negative ANA, positive SSB: She has chronic sicca symptoms.  Over-the-counter products discussed.  DDD (degenerative disc disease), cervical: She is good range of motion of the C-spine on exam.  She has trapezius muscle tension and muscle tenderness bilaterally.  She has been using warm moist heat on a daily basis as well as massage which has been managing her symptoms.  DDD (degenerative disc disease), lumbar: She is not experiencing any lower back pain at this time.  She has good range of motion with no discomfort.  No midline spinal tenderness.  No symptoms of radiculopathy.  Greater trochanteric bursitis of both hips: She has tenderness over bilateral trochanter bursa.  She has been experiencing increased discomfort on the left side.  She has tenderness and muscle tension in the hip flexors of the left hip.  She has no groin pain at this time.  X-rays of the left hip were obtained today which were unremarkable.  She was encouraged to perform stretching exercises on a daily basis.  She has been going to physical therapy once a week.   Pain in left hip - She presents today with left hip joint pain which started 2 months ago.  She is not experiencing any groin pain at this time.  She has not had any recent injuries or falls.  She denies any overuse activities.  She has tenderness over the left trochanteric bursa and IT band.  She is not experiencing any mechanical symptoms at this time.  She has limited range of motion of the left hip due to discomfort.  X-rays  of the left hip were obtained today which were unremarkable.  We discussed the importance of performing exercises on a daily basis and continuing to go to physical therapy once a week.  She was encouraged to continue to use Voltaren gel, ice, and heat as needed.  She takes hydrocodone for pain relief.  She was advised to notify us if her symptoms persist or worsen and we will proceed with scheduling an MRI of the left hip due to the severity and duration of her pain.  Plan: XR HIP UNILAT W OR W/O PELVIS 2-3 VIEWS LEFT  Other medical conditions are listed as follows:   Fatty liver  Pelvic floor dysfunction  History of migraine  History of diabetes mellitus  History of pancreatitis - Hereditary, followed up at [facility]  History of IBS    Orders: Orders Placed This Encounter  Procedures  . XR HIP UNILAT W OR W/O PELVIS 2-3 VIEWS LEFT   No orders of the defined types were placed in this encounter.   Face-to-face time spent with patient was 30 minutes. Greater than 50% of time was spent in counseling and coordination of care.  Follow-Up Instructions: Return in about 6 months (around 08/04/2020) for Fibromyalgia, DDD.   Gearldine Bienenstock, PA-C  Note - This record has been created using Dragon software.  Chart creation errors have been sought, but may not always  have been located. Such creation errors do not reflect on  the standard of medical care.

## 2020-02-02 ENCOUNTER — Ambulatory Visit (INDEPENDENT_AMBULATORY_CARE_PROVIDER_SITE_OTHER): Payer: Managed Care, Other (non HMO)

## 2020-02-02 ENCOUNTER — Other Ambulatory Visit: Payer: Self-pay

## 2020-02-02 ENCOUNTER — Ambulatory Visit (INDEPENDENT_AMBULATORY_CARE_PROVIDER_SITE_OTHER): Payer: Managed Care, Other (non HMO) | Admitting: Physician Assistant

## 2020-02-02 ENCOUNTER — Encounter: Payer: Self-pay | Admitting: Physician Assistant

## 2020-02-02 VITALS — BP 137/75 | HR 89 | Resp 16 | Ht 64.0 in | Wt 172.2 lb

## 2020-02-02 DIAGNOSIS — Z8669 Personal history of other diseases of the nervous system and sense organs: Secondary | ICD-10-CM

## 2020-02-02 DIAGNOSIS — M170 Bilateral primary osteoarthritis of knee: Secondary | ICD-10-CM

## 2020-02-02 DIAGNOSIS — M25552 Pain in left hip: Secondary | ICD-10-CM

## 2020-02-02 DIAGNOSIS — K760 Fatty (change of) liver, not elsewhere classified: Secondary | ICD-10-CM

## 2020-02-02 DIAGNOSIS — M5136 Other intervertebral disc degeneration, lumbar region: Secondary | ICD-10-CM

## 2020-02-02 DIAGNOSIS — M6289 Other specified disorders of muscle: Secondary | ICD-10-CM

## 2020-02-02 DIAGNOSIS — M5030 Other cervical disc degeneration, unspecified cervical region: Secondary | ICD-10-CM

## 2020-02-02 DIAGNOSIS — F5101 Primary insomnia: Secondary | ICD-10-CM | POA: Diagnosis not present

## 2020-02-02 DIAGNOSIS — M7061 Trochanteric bursitis, right hip: Secondary | ICD-10-CM

## 2020-02-02 DIAGNOSIS — R5383 Other fatigue: Secondary | ICD-10-CM | POA: Diagnosis not present

## 2020-02-02 DIAGNOSIS — Z8639 Personal history of other endocrine, nutritional and metabolic disease: Secondary | ICD-10-CM

## 2020-02-02 DIAGNOSIS — M797 Fibromyalgia: Secondary | ICD-10-CM

## 2020-02-02 DIAGNOSIS — M7062 Trochanteric bursitis, left hip: Secondary | ICD-10-CM

## 2020-02-02 DIAGNOSIS — M3509 Sicca syndrome with other organ involvement: Secondary | ICD-10-CM

## 2020-02-02 DIAGNOSIS — Z8719 Personal history of other diseases of the digestive system: Secondary | ICD-10-CM

## 2020-04-04 ENCOUNTER — Other Ambulatory Visit: Payer: Self-pay | Admitting: Adult Health

## 2020-04-07 ENCOUNTER — Observation Stay (HOSPITAL_BASED_OUTPATIENT_CLINIC_OR_DEPARTMENT_OTHER)
Admission: EM | Admit: 2020-04-07 | Discharge: 2020-04-08 | Disposition: A | Discharge status: Home / Self Care | Payer: Managed Care, Other (non HMO) | Attending: Internal Medicine | Admitting: Internal Medicine

## 2020-04-07 ENCOUNTER — Other Ambulatory Visit: Payer: Self-pay

## 2020-04-07 ENCOUNTER — Encounter (HOSPITAL_BASED_OUTPATIENT_CLINIC_OR_DEPARTMENT_OTHER): Payer: Self-pay | Admitting: Emergency Medicine

## 2020-04-07 DIAGNOSIS — M3500 Sicca syndrome, unspecified: Secondary | ICD-10-CM | POA: Insufficient documentation

## 2020-04-07 DIAGNOSIS — Z794 Long term (current) use of insulin: Secondary | ICD-10-CM | POA: Diagnosis not present

## 2020-04-07 DIAGNOSIS — E119 Type 2 diabetes mellitus without complications: Secondary | ICD-10-CM | POA: Diagnosis not present

## 2020-04-07 DIAGNOSIS — E785 Hyperlipidemia, unspecified: Secondary | ICD-10-CM | POA: Diagnosis not present

## 2020-04-07 DIAGNOSIS — R109 Unspecified abdominal pain: Secondary | ICD-10-CM | POA: Diagnosis not present

## 2020-04-07 DIAGNOSIS — Z79899 Other long term (current) drug therapy: Secondary | ICD-10-CM | POA: Insufficient documentation

## 2020-04-07 DIAGNOSIS — K861 Other chronic pancreatitis: Secondary | ICD-10-CM

## 2020-04-07 DIAGNOSIS — Z20822 Contact with and (suspected) exposure to covid-19: Secondary | ICD-10-CM | POA: Diagnosis not present

## 2020-04-07 DIAGNOSIS — Z8719 Personal history of other diseases of the digestive system: Secondary | ICD-10-CM | POA: Diagnosis not present

## 2020-04-07 DIAGNOSIS — J45909 Unspecified asthma, uncomplicated: Secondary | ICD-10-CM | POA: Diagnosis not present

## 2020-04-07 NOTE — ED Triage Notes (Signed)
States Chronic Pancreatitis. Onset 1500 today, and Vicodan at home not helping. Pt nauseated, no emesis in triage.

## 2020-04-08 ENCOUNTER — Observation Stay (HOSPITAL_BASED_OUTPATIENT_CLINIC_OR_DEPARTMENT_OTHER): Payer: Managed Care, Other (non HMO)

## 2020-04-08 ENCOUNTER — Encounter (HOSPITAL_BASED_OUTPATIENT_CLINIC_OR_DEPARTMENT_OTHER): Payer: Self-pay | Admitting: Emergency Medicine

## 2020-04-08 LAB — COMPREHENSIVE METABOLIC PANEL
ALT: 17 U/L (ref 0–44)
AST: 17 U/L (ref 15–41)
Albumin: 3.9 g/dL (ref 3.5–5.0)
Alkaline Phosphatase: 74 U/L (ref 38–126)
Anion gap: 11 (ref 5–15)
BUN: 13 mg/dL (ref 6–20)
CO2: 25 mmol/L (ref 22–32)
Calcium: 8.9 mg/dL (ref 8.9–10.3)
Chloride: 98 mmol/L (ref 98–111)
Creatinine, Ser: 0.61 mg/dL (ref 0.44–1.00)
GFR calc Af Amer: >60 mL/min (ref >60)
GFR calc non Af Amer: >60 mL/min (ref >60)
Glucose, Bld: 130 mg/dL — ABNORMAL HIGH (ref 70–99)
Potassium: 3.2 mmol/L — ABNORMAL LOW (ref 3.5–5.1)
Sodium: 134 mmol/L — ABNORMAL LOW (ref 135–145)
Total Bilirubin: 0.3 mg/dL (ref 0.3–1.2)
Total Protein: 7.8 g/dL (ref 6.5–8.1)

## 2020-04-08 LAB — CBC WITH DIFFERENTIAL/PLATELET
Abs Immature Granulocytes: 0.07 10*3/uL (ref 0.00–0.07)
Basophils Absolute: 0.1 10*3/uL (ref 0.0–0.1)
Basophils Relative: 0 %
Eosinophils Absolute: 0.3 10*3/uL (ref 0.0–0.5)
Eosinophils Relative: 2 %
HCT: 33.4 % — ABNORMAL LOW (ref 36.0–46.0)
Hemoglobin: 10 g/dL — ABNORMAL LOW (ref 12.0–15.0)
Immature Granulocytes: 1 %
Lymphocytes Relative: 18 %
Lymphs Abs: 2.7 10*3/uL (ref 0.7–4.0)
MCH: 21.1 pg — ABNORMAL LOW (ref 26.0–34.0)
MCHC: 29.9 g/dL — ABNORMAL LOW (ref 30.0–36.0)
MCV: 70.6 fL — ABNORMAL LOW (ref 80.0–100.0)
Monocytes Absolute: 1.2 10*3/uL — ABNORMAL HIGH (ref 0.1–1.0)
Monocytes Relative: 8 %
Neutro Abs: 10.4 10*3/uL — ABNORMAL HIGH (ref 1.7–7.7)
Neutrophils Relative %: 71 %
Platelets: 513 10*3/uL — ABNORMAL HIGH (ref 150–400)
RBC: 4.73 MIL/uL (ref 3.87–5.11)
RDW: 17.1 % — ABNORMAL HIGH (ref 11.5–15.5)
WBC: 14.6 10*3/uL — ABNORMAL HIGH (ref 4.0–10.5)
nRBC: 0 % (ref 0.0–0.2)

## 2020-04-08 LAB — RESPIRATORY PANEL BY RT PCR (FLU A&B, COVID)
Influenza A by PCR: NEGATIVE
Influenza B by PCR: NEGATIVE
SARS Coronavirus 2 by RT PCR: NEGATIVE

## 2020-04-08 LAB — LIPASE, BLOOD: Lipase: 40 U/L (ref 11–51)

## 2020-04-08 LAB — PREGNANCY, URINE: Preg Test, Ur: NEGATIVE

## 2020-04-08 MED ORDER — HYDROMORPHONE HCL 1 MG/ML IJ SOLN
1.0000 mg | Freq: Once | INTRAMUSCULAR | Status: AC
  Administered 2020-04-08: 1 mg via INTRAVENOUS
  Filled 2020-04-08: qty 1

## 2020-04-08 MED ORDER — SODIUM CHLORIDE 0.9 % IV SOLN: INTRAVENOUS | Status: DC

## 2020-04-08 MED ORDER — SODIUM CHLORIDE 0.9 % IV BOLUS
500.0000 mL | Freq: Once | INTRAVENOUS | Status: AC
  Administered 2020-04-08: 500 mL via INTRAVENOUS

## 2020-04-08 MED ORDER — IOHEXOL 300 MG/ML  SOLN
100.0000 mL | Freq: Once | INTRAMUSCULAR | Status: AC | PRN
  Administered 2020-04-08: 100 mL via INTRAVENOUS

## 2020-04-08 MED ORDER — FENTANYL CITRATE (PF) 100 MCG/2ML IJ SOLN
100.0000 ug | Freq: Once | INTRAMUSCULAR | Status: DC
  Filled 2020-04-08: qty 2

## 2020-04-08 MED ORDER — HYDROMORPHONE HCL 1 MG/ML IJ SOLN
0.5000 mg | Freq: Once | INTRAMUSCULAR | Status: AC
  Administered 2020-04-08: 0.5 mg via INTRAVENOUS
  Filled 2020-04-08: qty 1

## 2020-04-08 MED ORDER — POTASSIUM CHLORIDE 10 MEQ/100ML IV SOLN
10.0000 meq | INTRAVENOUS | Status: AC
  Administered 2020-04-08 (×4): 10 meq via INTRAVENOUS
  Filled 2020-04-08 (×4): qty 100

## 2020-04-08 NOTE — ED Notes (Signed)
PT given PO challenge.  

## 2020-04-08 NOTE — Discharge Instructions (Signed)
You were seen in the emergency department today with pancreatitis type pain.  Please follow closely with your primary care doctor for recheck of your potassium which was slightly low today.  We did replace your potassium while in the emergency department but if your potassium remains low you may need additional supplementation.  Please take your home pain and nausea medications and return to the emergency department any new or suddenly worsening symptoms.

## 2020-04-08 NOTE — ED Notes (Signed)
MD said to evaluate pain and po challenge in 15 minutes before giving next pain medicine dose.

## 2020-04-08 NOTE — ED Provider Notes (Signed)
[facility] EMERGENCY DEPARTMENT Provider Note   CSN: [id number] Arrival date & time: 04/07/20  2310     History Chief Complaint  Patient presents with  . Abdominal Pain    Heather Wagner is a 44 y.o. female.  The history is provided by the patient.  Abdominal Pain Pain location:  Generalized Pain radiates to:  Does not radiate Pain severity:  Severe Onset quality:  Gradual Duration:  9 hours Timing:  Constant Progression:  Worsening Chronicity:  Recurrent Context: not alcohol use and not eating   Relieved by:  Nothing Worsened by:  Nothing Ineffective treatments: home vicodin. 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  States that her pain started at 3 pm and was unrelieved by home vicodin.  Patient states she has nausea but has not had vomiting.  She states that secondary to allergies she cannot take any antiemetics nor morphine.  No f/c/r.  No diarrhea. No urinary symptoms.       Past Medical History:  Diagnosis Date  . Asthma   . Bursitis   . Chronic sinusitis   . DDD (degenerative disc disease), cervical   . DDD (degenerative disc disease), lumbar   . Diabetes mellitus    Type 1   . Fibromyalgia   . Hyperlipidemia   . IBS (irritable bowel syndrome)   . Migraine without aura, with intractable migraine, so stated, without mention of status migrainosus 01/25/2014  . Migraines   . Osteoarthritis of both knees 10/24/2016   moderate  . Pancreatitis chronic   . Pelvic floor dysfunction 10/24/2016  . Sjogren's syndrome ([place])   . Vulvar vestibulitis     Patient Active Problem List   Diagnosis Date Noted  . Intractable pain   . Acute on chronic pancreatitis ([place]) 10/28/2017   . DDD (degenerative disc disease), cervical 07/12/2017  . DDD (degenerative disc disease), lumbar 07/12/2017  . Primary insomnia 05/30/2017  . Other fatigue 05/30/2017  . ANA positive 04/19/2017  . History of chronic sinusitis 04/19/2017  . Fibromyalgia 10/24/2016  . Osteoarthritis of both knees 10/24/2016  . Sjogren's syndrome ([place]) 10/24/2016  . IBS (irritable bowel syndrome) 10/24/2016  . Migraines 10/24/2016  . Chronic sinusitis 10/24/2016  . Pelvic floor dysfunction 10/24/2016  . Vulvar vestibulitis 10/24/2016  . Dehydration 07/11/2016  . Fatty liver 07/11/2016  . Elevated LFTs 07/11/2016  . 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 Abdominal pain 01/05/2016  . Pancreatitis, acute   . CAP (community acquired pneumonia) 06/19/2014  . Pain management 06/18/2014  . Chronic pancreatitis ([place]) 06/18/2014  . Intractable migraine without aura 01/25/2014  . Acute pancreatitis 09/25/2013  . Leucocytosis 09/25/2013  . Microcytic anemia 09/25/2013  . Asthma 09/25/2013  . Type 2 diabetes mellitus without complication, with long-term current use of insulin ([place]) 09/25/2013  . Hyponatremia 09/25/2013    Past Surgical History:  Procedure Laterality Date  . BREAST REDUCTION SURGERY  2010  . DILATION AND CURETTAGE OF UTERUS  2007  . ERCP     Removed stone from Bile duct  . ERCP     with MRI  . PANCREAS SURGERY  1997  . whiple  1997  . whipple's       OB History  No obstetric history on file.     Family History  Problem Relation Age of Onset  . Asthma Mother   . High Cholesterol Father   . Macular degeneration Father   . Migraines Sister   . Endometriosis Sister   . Asthma Brother   . GER disease Brother   . Pancreatitis Paternal Grandfather   . Pancreatic cancer Other   . Pancreatitis Maternal Uncle     Social History   Tobacco Use  . Smoking status: Never Smoker  . Smokeless tobacco: Never Used  Substance Use Topics  . Alcohol use: No     Alcohol/week: 0.0 standard drinks  . Drug use: No    Home Medications Prior to Admission medications   Medication Sig Start Date End Date Taking? Authorizing Provider  albuterol (PROVENTIL HFA;VENTOLIN HFA) 108 (90 BASE) MCG/ACT inhaler Inhale 1-2 puffs into the lungs every 6 (six) hours as needed for wheezing. Asthma    [provider]  BAYER CONTOUR NEXT TEST test strip USE TO CHECK BLOOD SUGAR 4X A DAY (B08.9) 09/27/16   [provider]  Blood Glucose Calibration (ONETOUCH VERIO) SOLN LEVEL 3 (MID) AS DIRECTED USE TO CHECK GLUCOSE METER IN VITRO 12/11/19   [provider]  Blood Glucose Monitoring Suppl ([place]) w/Device KIT USE TO CHECK BLOOD SUGARS USE TO CHECK BLOOD SUGARS DX  E08.9 FINGERSTICK 12/10/19   [provider]  calcium carbonate (TUMS EX) 750 MG chewable tablet Chew 2-4 tablets by mouth 2 (two) times daily as needed for heartburn. No more than 10 a day    [provider]  carboxymethylcellulose (REFRESH PLUS) 0.5 % SOLN 1-2 drops every 2 (two) hours as needed for Dry eyes    [provider]  diclofenac sodium (VOLTAREN) 1 % GEL APPLY 3 GRAMS TO 3 LARGE JOINTS UP TO 3 TIMES DAILY AS NEEDED 11/03/18   Bo Merino, MD  FLOVENT HFA 110 MCG/ACT inhaler TAKE 2 PUFFS BY MOUTH TWICE A DAY 04/04/20   Parrett, Tammy S, NP  HYDROcodone-acetaminophen (NORCO/VICODIN) 5-325 MG per tablet Take 1-2 tablets by mouth every 6 (six) hours as needed for moderate pain.     [provider]  ibuprofen (ADVIL,MOTRIN) 200 MG tablet Take 400-800 mg by mouth every 8 (eight) hours as needed for moderate pain.     [provider]  Insulin Glargine (BASAGLAR KWIKPEN) 100 UNIT/ML SOPN Inject 22 Units into the skin 2 (two) times daily. 26 units am, 22 units pm    [provider]  insulin lispro (INSULIN LISPRO) 100 UNIT/ML KwikPen Junior Inject into the skin.    [provider]  Magnesium 300 MG CAPS Take  300 mg by mouth every evening.     [provider]  MICROLET LANCETS MISC AS DIRECTED FOUR TIMES A DAY 30 DAYS 08/05/16   [provider]  Multiple Vitamins-Minerals (ALIVE WOMENS GUMMY PO) Take 2 tablets by mouth daily after breakfast.     [provider]  omeprazole (PRILOSEC) 20 MG capsule Take 20 mg by mouth every morning.     [provider]  OVER THE COUNTER MEDICATION daily.    [provider]  polyethylene glycol (MIRALAX / GLYCOLAX) packet Take 17 g by mouth daily as needed for mild constipation.    [provider]  Probiotic Product (PROBIOTIC DAILY PO) Take 1 capsule by mouth daily. Reported on 01/16/2016    [provider]  rizatriptan (MAXALT) 10 MG tablet Take 10  mg by mouth as needed for migraine. May repeat in 2 hours if needed for headaches    [provider]  ZENPEP 25000 units CPEP Take 1-3 capsules by mouth 3 (three) times daily with meals as needed. 3-4 caps per meal and 1-2 caps per snack 12/03/15   [provider]    Allergies    Ciprofloxacin hcl, Nortriptyline hcl, Amitriptyline, Compazine [prochlorperazine maleate], Cymbalta [duloxetine hcl], Dexilant [dexlansoprazole], Doxycycline, Flagyl [metronidazole hcl], Gabapentin, Penicillins, Penicillins cross reactors, Septra [sulfamethoxazole-trimethoprim], Tizanidine, Topamax [topiramate], Toradol [ketorolac tromethamine], Zofran [ondansetron hcl], Amoxicillin, Breo ellipta [fluticasone furoate-vilanterol], Diflucan [fluconazole], Inapsine [droperidol], Ketamine, Levofloxacin, Lyrica [pregabalin], Phenergan [promethazine hcl], Phenothiazines, Primaxin [imipenem], Prozac [fluoxetine hcl], and Reglan [metoclopramide hcl]  Review of Systems   Review of Systems  Constitutional: Negative for fever.  HENT: Negative for congestion.   Eyes: Negative for visual disturbance.  Respiratory: Negative for shortness of breath.   Cardiovascular: Negative for chest  pain.  Gastrointestinal: Positive for abdominal pain and nausea. Negative for constipation, diarrhea and vomiting.  Genitourinary: Negative for dysuria.  Skin: Negative for rash.  Neurological: Negative for dizziness.  Psychiatric/Behavioral: Negative for agitation.  All other systems reviewed and are negative.   Physical Exam Updated Vital Signs BP 124/85   Pulse 84   Temp 98.8 F (37.1 C) (Oral)   Resp 13   Ht 5' 4"  (1.626 m)   Wt 76.2 kg   LMP 04/03/2020   SpO2 97%   BMI 28.84 kg/m   Physical Exam Vitals and nursing note reviewed.  Constitutional:      General: She is not in acute distress.    Appearance: Normal appearance. She is not diaphoretic.  HENT:     Head: Normocephalic and atraumatic.     Nose: Nose normal.  Eyes:     Conjunctiva/sclera: Conjunctivae normal.     Pupils: Pupils are equal, round, and reactive to light.  Cardiovascular:     Rate and Rhythm: Normal rate and regular rhythm.     Pulses: Normal pulses.     Heart sounds: Normal heart sounds.  Pulmonary:     Effort: Pulmonary effort is normal.     Breath sounds: Normal breath sounds.  Abdominal:     General: Abdomen is flat. Bowel sounds are normal.     Tenderness: There is no abdominal tenderness. There is no guarding or rebound.     Hernia: No hernia is present.  Musculoskeletal:        General: Normal range of motion.     Cervical back: Normal range of motion and neck supple.  Skin:    General: Skin is warm and dry.     Capillary Refill: Capillary refill takes less than 2 seconds.  Neurological:     General: No focal deficit present.     Mental Status: She is alert and oriented to person, place, and time.     Deep Tendon Reflexes: Reflexes normal.  Psychiatric:        Mood and Affect: Mood normal.        Behavior: Behavior normal.     ED Results / Procedures / Treatments   Labs (all labs ordered are listed, but only abnormal results are displayed) Results for orders placed or  performed during the hospital encounter of 04/07/20  CBC with Differential/Platelet  Result Value Ref Range   WBC 14.6 (H) 4.0 - 10.5 K/uL   RBC 4.73 3.87 - 5.11 MIL/uL   Hemoglobin 10.0 (L) 12.0 - 15.0 g/dL  HCT 33.4 (L) 36.0 - 46.0 %   MCV 70.6 (L) 80.0 - 100.0 fL   MCH 21.1 (L) 26.0 - 34.0 pg   MCHC 29.9 (L) 30.0 - 36.0 g/dL   RDW 17.1 (H) 11.5 - 15.5 %   Platelets 513 (H) 150 - 400 K/uL   nRBC 0.0 0.0 - 0.2 %   Neutrophils Relative % 71 %   Neutro Abs 10.4 (H) 1.7 - 7.7 K/uL   Lymphocytes Relative 18 %   Lymphs Abs 2.7 0.7 - 4.0 K/uL   Monocytes Relative 8 %   Monocytes Absolute 1.2 (H) 0.1 - 1.0 K/uL   Eosinophils Relative 2 %   Eosinophils Absolute 0.3 0.0 - 0.5 K/uL   Basophils Relative 0 %   Basophils Absolute 0.1 0.0 - 0.1 K/uL   Immature Granulocytes 1 %   Abs Immature Granulocytes 0.07 0.00 - 0.07 K/uL  Comprehensive metabolic panel  Result Value Ref Range   Sodium 134 (L) 135 - 145 mmol/L   Potassium 3.2 (L) 3.5 - 5.1 mmol/L   Chloride 98 98 - 111 mmol/L   CO2 25 22 - 32 mmol/L   Glucose, Bld 130 (H) 70 - 99 mg/dL   BUN 13 6 - 20 mg/dL   Creatinine, Ser 0.61 0.44 - 1.00 mg/dL   Calcium 8.9 8.9 - 10.3 mg/dL   Total Protein 7.8 6.5 - 8.1 g/dL   Albumin 3.9 3.5 - 5.0 g/dL   AST 17 15 - 41 U/L   ALT 17 0 - 44 U/L   Alkaline Phosphatase 74 38 - 126 U/L   Total Bilirubin 0.3 0.3 - 1.2 mg/dL   GFR calc non Af Amer >60 >60 mL/min   GFR calc Af Amer >60 >60 mL/min   Anion gap 11 5 - 15  Lipase, blood  Result Value Ref Range   Lipase 40 11 - 51 U/L  Pregnancy, urine  Result Value Ref Range   Preg Test, Ur NEGATIVE NEGATIVE   No results found.  EKG None  Radiology No results found.  Procedures Procedures (including critical care time)  Medications Ordered in ED Medications  fentaNYL (SUBLIMAZE) injection 100 mcg (100 mcg Intravenous Refused 04/08/20 0100)  0.9 %  sodium chloride infusion (has no administration in time range)  potassium chloride 10 mEq  in 100 mL IVPB (has no administration in time range)  sodium chloride 0.9 % bolus 500 mL (0 mLs Intravenous Stopped 04/08/20 0147)  HYDROmorphone (DILAUDID) injection 0.5 mg (0.5 mg Intravenous Given 04/08/20 0113)  HYDROmorphone (DILAUDID) injection 0.5 mg (0.5 mg Intravenous Given 04/08/20 0217)  HYDROmorphone (DILAUDID) injection 1 mg (1 mg Intravenous Given 04/08/20 0329)    ED Course  I have reviewed the triage vital signs and the nursing notes.  Pertinent labs & imaging results that were available during my care of the patient were reviewed by me and considered in my medical decision making (see chart for details).    EDP ordered Fentanyl, as this was not listed as an allergy but was informed when it was brought to her that she then did advise nurse of a reaction to this medication.  Patient is not improving in pain medication and I will admit the patient to medicine for ongoing care and treatment of her pain.    Heather Wagner was evaluated in Emergency Department on 04/08/2020 for the symptoms described in the history of present illness. She was evaluated in the context of the global COVID-19 pandemic, which necessitated consideration  that the patient might be at risk for infection with the SARS-CoV-2 virus that causes COVID-19. Institutional protocols and algorithms that pertain to the evaluation of patients at risk for COVID-19 are in a state of rapid change based on information released by regulatory bodies including the CDC and federal and state organizations. These policies and algorithms were followed during the patient's care in the ED.  Final Clinical Impression(s) / ED Diagnoses Final diagnoses:  Chronic pancreatitis, unspecified pancreatitis type [facility])   Admit to medicine    Serinity Ware, MD 04/08/20 0340

## 2020-04-08 NOTE — ED Notes (Signed)
[facility] notified (Cassie) - hospitalist @ [facility] to change bed assignment 

## 2020-04-08 NOTE — ED Notes (Signed)
  Patient refused IV fentanyl and requested dilaudid.  Patient states she has had a reaction to the fentanyl before but it is not listed within her 69 allergies.  Patient states that the only thing that works is dilaudid.  Dr Nicanor Alcon notified.

## 2020-04-08 NOTE — ED Provider Notes (Signed)
Blood pressure 122/89, pulse 75, temperature 98.8 F (37.1 C), temperature source Oral, resp. rate 15, height 5\' 4"  (1.626 m), weight 76.2 kg, last menstrual period 04/03/2020, SpO2 99 %.  Assuming care from Dr. 06/03/2020.  In short, Heather Wagner is a 44 y.o. female with a chief complaint of Abdominal Pain .  Refer to the original H&P for additional details.  08:00 AM  Patient continuing to board in the emergency department.  Contacted bed placement and they are full at the moment with no timeline for patient getting a bed.  Call both [name] and [name] with similar situation. Anticipate longer than 10 hour wait for bed. Spoke with Dr. Borgarnes at [facility] as they appear to have fewer ED holds and he accepts the patient to the ED.   08:23 AM  Called to the patient's room.  She tells me that she is feeling better and is asking about going home.  She tells me this is happened to her many times and feels very typical of her prior symptoms.  She has been afebrile here and is finishing her last run of potassium.  She has pain and nausea medications at home.  I reviewed the patient's blood work showing leukocytosis and mild hypokalemia replaced here in the ED. COVID negative. Primary reason for admit was pain control so with improved symptoms discharge is reasonable. Discussed ED return precautions. Patient has messaged her PCP. D/C once K is completed.   09:24 AM  Potassium complete.  Patient wishing to be discharged.  I made Dr. [facility] aware that the patient is being discharged and will be taken off the [facility] list.     [name], [facility], MD 04/08/20 [phone]

## 2020-04-08 NOTE — ED Notes (Signed)
Patient stated that she wanted to go home.  Informed her that she has an hour to finish the potassium and I have to inform MD.  Dr. Jacqulyn Bath was informed of patient's request.   MD went to patient's room and spoke with her.

## 2020-05-08 ENCOUNTER — Other Ambulatory Visit: Payer: Self-pay | Admitting: Adult Health

## 2020-05-09 ENCOUNTER — Telehealth: Payer: Self-pay | Admitting: Adult Health

## 2020-05-09 NOTE — Telephone Encounter (Signed)
She is due for follow up with Dr. Delton Coombes  , did she make follow up visit  She has 28 allergies .  Said she could not tolerate a lot of the inhaler in the past  Will need ov for evaluation to discuss if need to change inhaler

## 2020-05-09 NOTE — Telephone Encounter (Signed)
Called and spoke with pt letting her know the info stated by TP and she verbalized understanding. Pt has been scheduled for an appt with RB tomorrow 6/8 at 3:15. Nothing further needed.

## 2020-05-09 NOTE — Telephone Encounter (Signed)
Patient is returning phone call. Patient phone number is [phone].

## 2020-05-09 NOTE — Telephone Encounter (Signed)
ATC patient LMTCB  Tammy please advise:  Patient said TP mentioned changing inhaler for summer due to extreme humidity. Patient is on QVAR, which works in winter. Has been on [facility], gave patient palpitations. Please advise [phone]

## 2020-05-10 ENCOUNTER — Ambulatory Visit: Payer: Managed Care, Other (non HMO) | Admitting: Emergency Medicine

## 2020-05-12 ENCOUNTER — Encounter: Payer: Self-pay | Admitting: Adult Health

## 2020-05-12 ENCOUNTER — Ambulatory Visit (INDEPENDENT_AMBULATORY_CARE_PROVIDER_SITE_OTHER): Payer: Managed Care, Other (non HMO) | Admitting: Adult Health

## 2020-05-12 ENCOUNTER — Other Ambulatory Visit: Payer: Self-pay

## 2020-05-12 DIAGNOSIS — J4530 Mild persistent asthma, uncomplicated: Secondary | ICD-10-CM

## 2020-05-12 MED ORDER — BUDESONIDE-FORMOTEROL FUMARATE 160-4.5 MCG/ACT IN AERO: 2.0000 | INHALATION_SPRAY | Freq: Two times a day (BID) | RESPIRATORY_TRACT | 12 refills | Status: DC

## 2020-05-12 NOTE — Patient Instructions (Addendum)
Change from Flovent to Symbicort 1 puff Twice daily  , rinse after use.  Albuterol inhaler 2 puffs every 6hrs as needed for wheezing , this is your rescue inhaler.  Activity as tolerated .  Follow up with Dr. Delton Coombes  Or Gaylan Fauver in 6 -8 weeks  and As needed

## 2020-05-12 NOTE — Progress Notes (Signed)
Virtual Visit via Telephone Note  I connected with Heather Wagner on 05/12/20 at  2:00 PM EDT by telephone and verified that I am speaking with the correct person using two identifiers.  Location: Patient: Home  Provider: Home Office    I discussed the limitations, risks, security and privacy concerns of performing an evaluation and management service by telephone and the availability of in person appointments. I also discussed with the patient that there may be a patient responsible charge related to this service. The patient expressed understanding and agreed to proceed.   History of Present Illness: 44 year old female never smoker followed for asthma and allergic rhinitis Medical history significant for chronic pancreatitis and Sjogren's disease, diabetes  Today's televisit is a follow-up for asthma.  Patient says she has been doing well with her asthma up until the last 2 months.  Since the weather has warmed up and is now getting more humid and hot outside she has noticed that her breathing has not been doing as well.  She has more tightness and shortness of breath.  She has had to start using her albuterol more.  She has some mild cough.  No significant wheezing as of yet.  She denies any chest pain orthopnea PND leg swelling or hemoptysis.  She has not received the Covid vaccine.  She does have an upcoming surgery with for a hysterectomy in the next couple weeks and wants to get her breathing improved.  She has previously tried [place] 2 puffs twice daily however felt that it caused too much nervousness.  Had tried some dry powder inhalers in the past and was intolerant.  Patient Active Problem List   Diagnosis Date Noted  . Intractable pain   . Acute on chronic pancreatitis ([place]) 10/28/2017  . DDD (degenerative disc disease), cervical 07/12/2017  . DDD (degenerative disc disease), lumbar 07/12/2017  . Primary insomnia 05/30/2017  . Other fatigue 05/30/2017  . ANA positive  04/19/2017  . History of chronic sinusitis 04/19/2017  . Fibromyalgia 10/24/2016  . Osteoarthritis of both knees 10/24/2016  . Sjogren's syndrome ([place]) 10/24/2016  . IBS (irritable bowel syndrome) 10/24/2016  . Migraines 10/24/2016  . Chronic sinusitis 10/24/2016  . Pelvic floor dysfunction 10/24/2016  . Vulvar vestibulitis 10/24/2016  . Dehydration 07/11/2016  . Fatty liver 07/11/2016  . Elevated LFTs 07/11/2016  . 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 Abdominal pain 01/05/2016  . Pancreatitis, acute   . CAP (community acquired pneumonia) 06/19/2014  . Pain management 06/18/2014  . Chronic pancreatitis ([place]) 06/18/2014  . Intractable migraine without aura 01/25/2014  . Acute pancreatitis 09/25/2013  . Leucocytosis 09/25/2013  . Microcytic anemia 09/25/2013  . Asthma 09/25/2013  . Type 2 diabetes mellitus without complication, with long-term current use of insulin ([place]) 09/25/2013  . Hyponatremia 09/25/2013   Current Outpatient Medications on File Prior to Visit  Medication Sig Dispense Refill  . albuterol (PROVENTIL HFA;VENTOLIN HFA) 108 (90 BASE) MCG/ACT inhaler Inhale 1-2 puffs into the lungs every 6 (six) hours as needed for wheezing. Asthma    . BAYER CONTOUR NEXT TEST test strip USE TO CHECK BLOOD SUGAR 4X A DAY (B08.9)  5  . Blood Glucose Calibration (ONETOUCH VERIO) SOLN LEVEL 3 (MID) AS DIRECTED USE TO CHECK GLUCOSE METER IN VITRO    . Blood Glucose Monitoring Suppl (ONETOUCH VERIO FLEX SYSTEM) w/Device KIT USE TO CHECK BLOOD SUGARS USE TO CHECK BLOOD SUGARS DX  E08.9 FINGERSTICK    . calcium carbonate (TUMS EX) 750 MG  chewable tablet Chew 2-4 tablets by mouth 2 (two) times daily as needed for heartburn. No more than 10 a day    . carboxymethylcellulose (REFRESH PLUS) 0.5 % SOLN 1-2 drops every 2 (two) hours as needed for Dry eyes    . diclofenac sodium (VOLTAREN) 1 % GEL APPLY 3 GRAMS TO 3 LARGE JOINTS UP TO 3 TIMES DAILY AS NEEDED 300 g 3  . FLOVENT HFA 110  MCG/ACT inhaler TAKE 2 PUFFS BY MOUTH TWICE A DAY 12 Inhaler 0  . HYDROcodone-acetaminophen (NORCO/VICODIN) 5-325 MG per tablet Take 1-2 tablets by mouth every 6 (six) hours as needed for moderate pain.     Marland Kitchen ibuprofen (ADVIL,MOTRIN) 200 MG tablet Take 400-800 mg by mouth every 8 (eight) hours as needed for moderate pain.     . Insulin Glargine (BASAGLAR KWIKPEN) 100 UNIT/ML SOPN Inject 22 Units into the skin 2 (two) times daily. 26 units am, 22 units pm    . insulin lispro (INSULIN LISPRO) 100 UNIT/ML KwikPen Junior Inject into the skin.    . Magnesium 300 MG CAPS Take 300 mg by mouth every evening.     Marland Kitchen MICROLET LANCETS MISC AS DIRECTED FOUR TIMES A DAY 30 DAYS  2  . Multiple Vitamins-Minerals (ALIVE WOMENS GUMMY PO) Take 2 tablets by mouth daily after breakfast.     . omeprazole (PRILOSEC) 20 MG capsule Take 20 mg by mouth every morning.     Marland Kitchen OVER THE COUNTER MEDICATION daily.    . polyethylene glycol (MIRALAX / GLYCOLAX) packet Take 17 g by mouth daily as needed for mild constipation.    . Probiotic Product (PROBIOTIC DAILY PO) Take 1 capsule by mouth daily. Reported on 01/16/2016    . rizatriptan (MAXALT) 10 MG tablet Take 10 mg by mouth as needed for migraine. May repeat in 2 hours if needed for headaches    . ZENPEP 25000 units CPEP Take 1-3 capsules by mouth 3 (three) times daily with meals as needed. 3-4 caps per meal and 1-2 caps per snack  11   No current facility-administered medications on file prior to visit.      Observations/Objective: Speaks in full sentences with no audible distress.  Assessment and Plan: Mild persistent asthma with increased symptom burden with the increased humidity outside.  We will try Symbicort but at a lower dose only 1 puff twice daily. Close follow-up in 6 weeks in person visit   Plan  Patient Instructions  Change from Flovent to Symbicort 1 puff Twice daily  , rinse after use.  Albuterol inhaler 2 puffs every 6hrs as needed for wheezing , this  is your rescue inhaler.  Activity as tolerated .  Follow up with Dr. Lamonte Sakai  Or Anique Beckley in 6 -8 weeks  and As needed        Follow Up Instructions: Follow-up in 6 to 8 weeks and As needed     I discussed the assessment and treatment plan with the patient. The patient was provided an opportunity to ask questions and all were answered. The patient agreed with the plan and demonstrated an understanding of the instructions.   The patient was advised to call back or seek an in-person evaluation if the symptoms worsen or if the condition fails to improve as anticipated.  I provided 22  minutes of non-face-to-face time during this encounter.   Rexene Edison, NP

## 2020-05-12 NOTE — Telephone Encounter (Signed)
Patient is requesting Dulera. See message below.  Good evening, Ms. Heather Wagner.   I got a message from [facility]- they cannot fill the prescription for Budesonide-Formoterol 160-4.5 HFA Aerosol Inhaler because it requires insurance approval. I checked with Rosann Auerbach and it is not covered under my particular plan. Fortunately, the Elwin Sleight is covered and should cost $35.00 for a 30-day supply. I wanted to let you know, so we can switch to the [facility] instead. Hopefully [facility] will be able to [facility] it tomorrow. I will let you know if there are any more issues with the prescription.   Thanks for your help and have a good night, [name]

## 2020-05-13 MED ORDER — MOMETASONE FURO-FORMOTEROL FUM 100-5 MCG/ACT IN AERO
2.0000 | INHALATION_SPRAY | Freq: Two times a day (BID) | RESPIRATORY_TRACT | 5 refills | Status: DC
Start: 2020-05-13 — End: 2020-12-23

## 2020-05-13 NOTE — Telephone Encounter (Signed)
Okay great thanks for letting us know you can do Dulera 101 puff twice daily Please let us know how this is working for you  Please contact office for sooner follow up if symptoms do not improve or worsen or seek emergency care

## 2020-05-24 ENCOUNTER — Telehealth: Payer: Self-pay | Admitting: Adult Health

## 2020-05-24 NOTE — Telephone Encounter (Signed)
Patient is taking Dulera and is having trouble sleeping. She wants to push back her PM dose so she can sleep. Patient is going to try her PM dose at 6 pm and call us if she continues to not sleep. This plan was previously discussed at there last visit when she restarted the [facility]. Patient will call back if this plan is not working.

## 2020-05-24 NOTE — Telephone Encounter (Signed)
Medication name and strength: budesonide-formoterol fumarate 160-4.84mcg Provider: parrett [facility] Patient insurance ID: [id number] Phone: [phone] Fax: [phone]  Was the PA started on CMM?  yes If yes, please enter the Key: [id number] Timeframe for approval/denial: Your information has been submitted to [facility] and is being reviewed. You may close this dialog, return to your dashboard, and perform other tasks. You will receive an electronic determination in CoverMyMeds within 72-120 hours; you'll also receive a faxed copy. You can see the latest determination by locating this request on your dashboard or reopening this request. If Rosann Auerbach has not responded in 120 hours, contact [employer] at [phone].  After initiating the PA, saw that pt had sent a mychart message stating since Elwin Sleight was covered, she was requesting that to be sent to pharmacy and stated if [facility] got to where it wasn't working for her, she would call back to change inhalers. Will go ahead and route encounter to myself so I can follow up on as pt is switching to a different inhaler.

## 2020-05-27 HISTORY — PX: ABDOMINAL HYSTERECTOMY: SHX81

## 2020-05-27 HISTORY — PX: LAPAROSCOPIC HYSTERECTOMY: SHX1926

## 2020-06-01 NOTE — Telephone Encounter (Signed)
Checked CMM and saw that the PA has been denied for her budesonide-formoterol fumarate 160. Pt had stated previously that she will [facility] which [facility]. Nothing further needed.

## 2020-06-13 ENCOUNTER — Other Ambulatory Visit: Payer: Self-pay | Admitting: Adult Health

## 2020-07-22 NOTE — Progress Notes (Deleted)
Office Visit Note  Patient: Heather Wagner             Date of Birth: 1976-07-02           MRN: [id number]             PCP: Maurice Small, MD Referring: Maurice Small, MD Visit Date: 08/04/2020 Occupation: @GUAROCC @  Subjective:  No chief complaint on file.   History of Present Illness: Heather Wagner is a 44 y.o. female ***   Activities of Daily Living:  Patient reports morning stiffness for *** {minute/hour:19697}.   Patient {ACTIONS;DENIES/REPORTS:21021675::"Denies"} nocturnal pain.  Difficulty dressing/grooming: {ACTIONS;DENIES/REPORTS:21021675::"Denies"} Difficulty climbing stairs: {ACTIONS;DENIES/REPORTS:21021675::"Denies"} Difficulty getting out of chair: {ACTIONS;DENIES/REPORTS:21021675::"Denies"} Difficulty using hands for taps, buttons, cutlery, and/or writing: {ACTIONS;DENIES/REPORTS:21021675::"Denies"}  No Rheumatology ROS completed.   PMFS History:  Patient Active Problem List   Diagnosis Date Noted  . Intractable pain   . Acute on chronic pancreatitis (HCC) 10/28/2017  . DDD (degenerative disc disease), cervical 07/12/2017  . DDD (degenerative disc disease), lumbar 07/12/2017  . Primary insomnia 05/30/2017  . Other fatigue 05/30/2017  . ANA positive 04/19/2017  . History of chronic sinusitis 04/19/2017  . Fibromyalgia 10/24/2016  . Osteoarthritis of both knees 10/24/2016  . Sjogren's syndrome (HCC) 10/24/2016  . IBS (irritable bowel syndrome) 10/24/2016  . Migraines 10/24/2016  . Chronic sinusitis 10/24/2016  . Pelvic floor dysfunction 10/24/2016  . Vulvar vestibulitis 10/24/2016  . Dehydration 07/11/2016  . Fatty liver 07/11/2016  . Elevated LFTs 07/11/2016  . 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 Abdominal pain 01/05/2016  . Pancreatitis, acute   . CAP (community acquired pneumonia) 06/19/2014  . Pain management 06/18/2014  . Chronic pancreatitis (HCC) 06/18/2014  . Intractable migraine without aura 01/25/2014  . Acute  pancreatitis 09/25/2013  . Leucocytosis 09/25/2013  . Microcytic anemia 09/25/2013  . Asthma 09/25/2013  . Type 2 diabetes mellitus without complication, with long-term current use of insulin (HCC) 09/25/2013  . Hyponatremia 09/25/2013    Past Medical History:  Diagnosis Date  . Asthma   . Bursitis   . Chronic sinusitis   . DDD (degenerative disc disease), cervical   . DDD (degenerative disc disease), lumbar   . Diabetes mellitus    Type 1   . Fibromyalgia   . Hyperlipidemia   . IBS (irritable bowel syndrome)   . Migraine without aura, with intractable migraine, so stated, without mention of status migrainosus 01/25/2014  . Migraines   . Osteoarthritis of both knees 10/24/2016   moderate  . Pancreatitis chronic   . Pelvic floor dysfunction 10/24/2016  . Sjogren's syndrome (HCC)   . Vulvar vestibulitis     Family History  Problem Relation Age of Onset  . Asthma Mother   . High Cholesterol Father   . Macular degeneration Father   . Migraines Sister   . Endometriosis Sister   . Asthma Brother   . GER disease Brother   . Pancreatitis Paternal Grandfather   . Pancreatic cancer Other   . Pancreatitis Maternal Uncle    Past Surgical History:  Procedure Laterality Date  . BREAST REDUCTION SURGERY  2010  . DILATION AND CURETTAGE OF UTERUS  2007  . ERCP     Removed stone from Bile duct  . ERCP     with MRI  . PANCREAS SURGERY  1997  . whiple  1997  . whipple's     Social History   Social History Narrative  . Not on file   Immunization History  Administered  Date(s) Administered  . Influenza Split 10/08/2018  . Influenza-Unspecified 09/02/2014     Objective: Vital Signs: There were no vitals taken for this visit.   Physical Exam   Musculoskeletal Exam: ***  CDAI Exam: CDAI Score: -- Patient Global: --; Provider Global: -- Swollen: --; Tender: -- Joint Exam 08/04/2020   No joint exam has been documented for this visit   There is currently no  information documented on the homunculus. Go to the Rheumatology activity and complete the homunculus joint exam.  Investigation: No additional findings.  Imaging: No results found.  Recent Labs: Lab Results  Component Value Date   WBC 14.6 (H) 04/08/2020   HGB 10.0 (L) 04/08/2020   PLT 513 (H) 04/08/2020   NA 134 (L) 04/08/2020   K 3.2 (L) 04/08/2020   CL 98 04/08/2020   CO2 25 04/08/2020   GLUCOSE 130 (H) 04/08/2020   BUN 13 04/08/2020   CREATININE 0.61 04/08/2020   BILITOT 0.3 04/08/2020   ALKPHOS 74 04/08/2020   AST 17 04/08/2020   ALT 17 04/08/2020   PROT 7.8 04/08/2020   ALBUMIN 3.9 04/08/2020   CALCIUM 8.9 04/08/2020   GFRAA >60 04/08/2020    Speciality Comments: No specialty comments available.  Procedures:  No procedures performed Allergies: Ciprofloxacin hcl, Nortriptyline hcl, Amitriptyline, Compazine [prochlorperazine maleate], Cymbalta [duloxetine hcl], Dexilant [dexlansoprazole], Doxycycline, Flagyl [metronidazole hcl], Gabapentin, Penicillins, Penicillins cross reactors, Septra [sulfamethoxazole-trimethoprim], Tizanidine, Topamax [topiramate], Toradol [ketorolac tromethamine], Zofran [ondansetron hcl], Amoxicillin, Breo ellipta [fluticasone furoate-vilanterol], Diflucan [fluconazole], Inapsine [droperidol], Ketamine, Levofloxacin, Lyrica [pregabalin], Phenergan [promethazine hcl], Phenothiazines, Primaxin [imipenem], Prozac [fluoxetine hcl], and Reglan [metoclopramide hcl]   Assessment / Plan:     Visit Diagnoses: No diagnosis found.  Orders: No orders of the defined types were placed in this encounter.  No orders of the defined types were placed in this encounter.   Face-to-face time spent with patient was *** minutes. Greater than 50% of time was spent in counseling and coordination of care.  Follow-Up Instructions: No follow-ups on file.   Ellen Henri, CMA  Note - This record has been created using Animal nutritionist.  Chart creation errors  have been sought, but may not always  have been located. Such creation errors do not reflect on  the standard of medical care.

## 2020-08-04 ENCOUNTER — Ambulatory Visit: Payer: Managed Care, Other (non HMO) | Admitting: Physician Assistant

## 2020-08-04 DIAGNOSIS — Z8639 Personal history of other endocrine, nutritional and metabolic disease: Secondary | ICD-10-CM

## 2020-08-04 DIAGNOSIS — M797 Fibromyalgia: Secondary | ICD-10-CM

## 2020-08-04 DIAGNOSIS — F5101 Primary insomnia: Secondary | ICD-10-CM

## 2020-08-04 DIAGNOSIS — M170 Bilateral primary osteoarthritis of knee: Secondary | ICD-10-CM

## 2020-08-04 DIAGNOSIS — M5136 Other intervertebral disc degeneration, lumbar region: Secondary | ICD-10-CM

## 2020-08-04 DIAGNOSIS — Z8669 Personal history of other diseases of the nervous system and sense organs: Secondary | ICD-10-CM

## 2020-08-04 DIAGNOSIS — M5030 Other cervical disc degeneration, unspecified cervical region: Secondary | ICD-10-CM

## 2020-08-04 DIAGNOSIS — M3509 Sicca syndrome with other organ involvement: Secondary | ICD-10-CM

## 2020-08-04 DIAGNOSIS — M7061 Trochanteric bursitis, right hip: Secondary | ICD-10-CM

## 2020-08-04 DIAGNOSIS — Z8719 Personal history of other diseases of the digestive system: Secondary | ICD-10-CM

## 2020-08-04 DIAGNOSIS — R5383 Other fatigue: Secondary | ICD-10-CM

## 2020-08-04 DIAGNOSIS — K760 Fatty (change of) liver, not elsewhere classified: Secondary | ICD-10-CM

## 2020-08-04 DIAGNOSIS — M6289 Other specified disorders of muscle: Secondary | ICD-10-CM

## 2020-08-17 ENCOUNTER — Ambulatory Visit: Payer: Managed Care, Other (non HMO) | Admitting: Emergency Medicine

## 2020-08-30 ENCOUNTER — Encounter: Payer: Self-pay | Admitting: Adult Health

## 2020-08-30 ENCOUNTER — Ambulatory Visit (INDEPENDENT_AMBULATORY_CARE_PROVIDER_SITE_OTHER): Payer: Managed Care, Other (non HMO) | Admitting: Adult Health

## 2020-08-30 ENCOUNTER — Ambulatory Visit (INDEPENDENT_AMBULATORY_CARE_PROVIDER_SITE_OTHER): Payer: Managed Care, Other (non HMO)

## 2020-08-30 ENCOUNTER — Other Ambulatory Visit: Payer: Self-pay

## 2020-08-30 DIAGNOSIS — J4530 Mild persistent asthma, uncomplicated: Secondary | ICD-10-CM | POA: Diagnosis not present

## 2020-08-30 DIAGNOSIS — M797 Fibromyalgia: Secondary | ICD-10-CM | POA: Diagnosis not present

## 2020-08-30 MED ORDER — ALBUTEROL SULFATE HFA 108 (90 BASE) MCG/ACT IN AERS: 1.0000 | INHALATION_SPRAY | Freq: Four times a day (QID) | RESPIRATORY_TRACT | 6 refills | Status: DC | PRN

## 2020-08-30 NOTE — Addendum Note (Signed)
Addended by: Vianne Bulls R on: 08/30/2020 05:08 PM   Modules accepted: Orders

## 2020-08-30 NOTE — Patient Instructions (Addendum)
Continue on Dulera 2 puffs Twice daily  , rinse after use.  Albuterol inhaler 2 puffs every 6hrs as needed for wheezing , this is your rescue inhaler.  Activity as tolerated .  Chest xray today .  Follow up with Dr. Delton Coombes 4 to 6 months  and As needed

## 2020-08-30 NOTE — Progress Notes (Signed)
_0  ID: Heather Wagner, female    DOB: Jul 13, 1976, 44 y.o.   MRN: [id number]  Chief Complaint  Patient presents with  . Follow-up    Asthma     Referring provider: Kelton Pillar, MD  HPI: 44 year old female never smoker followed for asthma and allergic rhinitis Medical history significant for chronic pancreatitis and Sjogren's disease, DM , Fibromyalgia   TEST/EVENTS :   08/30/2020 Follow up : Asthma  Patient presents for a 49-monthfollow-up.  Patient says since last visit she is doing okay with her breathing with no flare of cough or wheezing . [facility]  . No increased albuterol use.  Feels DRuthe Mannanis working much better.  Walking every day, with increased activity tolerance.   Diagnosed with costochondritis 10 months ago, has been referred to physical therapy , and massage therapy . Has pain in back and chest , sore to touch and worse with movement .  Does feel it helps but still going on.  Has Fibromyaliga as well. Follows with intergrative therapy for chronic pain.  Chest xray 12/2019 was clear .  No rash. No fever.      Allergies  Allergen Reactions  . Ciprofloxacin Hcl Other (See Comments)    Flare up of pancreatitis, severe yeast infection   . Nortriptyline Hcl Shortness Of Breath    Swelling anxiety  . Amitriptyline Other (See Comments)    sedation  . Compazine [Prochlorperazine Maleate] Other (See Comments)    Neurological - uncontrollable eye movements  . Cymbalta [Duloxetine Hcl] Nausea And Vomiting    insomnia  . Dexilant [Dexlansoprazole] Nausea Only  . Doxycycline Nausea And Vomiting  . Flagyl [Metronidazole Hcl] Nausea And Vomiting    Skin rash  . Gabapentin Nausea And Vomiting and Other (See Comments)    Agitation  . Penicillins   . Penicillins Cross Reactors Nausea And Vomiting    Skin rash/hives  . Septra [Sulfamethoxazole-Trimethoprim] Other (See Comments)    Severe upset stomach, nausea, stomach pain, over  heating.   . Tizanidine Nausea And Vomiting and Other (See Comments)    Agitation  . Topamax [Topiramate] Other (See Comments)    Mood change, anxiety, headaches  . Toradol [Ketorolac Tromethamine] Other (See Comments)    Anxiety.  .Tresa MooreHcl]     Headaches   . Amoxicillin Nausea And Vomiting and Anxiety    Has patient had a PCN reaction causing immediate rash, facial/tongue/throat swelling, SOB or lightheadedness with hypotension:  Has patient had a PCN reaction causing severe rash involving mucus membranes or skin necrosis:  Has patient had a PCN reaction that required hospitalization  Has patient had a PCN reaction occurring within the last 10 years:  If all of the above answers are "NO", then may proceed with Cephalosporin use.   .Adair Patter[Fluticasone Furoate-Vilanterol] Anxiety, Other (See Comments) and Palpitations  . Diflucan [Fluconazole] Diarrhea and Palpitations    Stomach pain, pancreatitis flare up, severe headache   . Inapsine [Droperidol] Nausea And Vomiting  . Ketamine Anxiety    Agitation, insomnia   . Levofloxacin Palpitations and Other (See Comments)    Light headed, anxiety, blood rushing, shaking, sweating.l   . Lyrica [Pregabalin] Other (See Comments)    Mood changes- become listless, disinterested  . Phenergan [Promethazine Hcl] Anxiety  . Phenothiazines Hives, Nausea And Vomiting and Rash  . Primaxin [Imipenem] Nausea And Vomiting  . Prozac [Fluoxetine Hcl] Anxiety  . Reglan [Metoclopramide Hcl] Anxiety  Immunization History  Administered Date(s) Administered  . Influenza Split 10/08/2018  . Influenza-Unspecified 09/02/2014    Past Medical History:  Diagnosis Date  . Asthma   . Bursitis   . Chronic sinusitis   . DDD (degenerative disc disease), cervical   . DDD (degenerative disc disease), lumbar   . Diabetes mellitus    Type 1   . Fibromyalgia   . Hyperlipidemia   . IBS (irritable bowel syndrome)   . Migraine without  aura, with intractable migraine, so stated, without mention of status migrainosus 01/25/2014  . Migraines   . Osteoarthritis of both knees 10/24/2016   moderate  . Pancreatitis chronic   . Pelvic floor dysfunction 10/24/2016  . Sjogren's syndrome ([place])   . Vulvar vestibulitis     Tobacco History: Social History   Tobacco Use  Smoking Status Never Smoker  Smokeless Tobacco Never Used   Counseling given: Not Answered   Outpatient Medications Prior to Visit  Medication Sig Dispense Refill  . albuterol (PROVENTIL HFA;VENTOLIN HFA) 108 (90 BASE) MCG/ACT inhaler Inhale 1-2 puffs into the lungs every 6 (six) hours as needed for wheezing. Asthma    . BAYER CONTOUR NEXT TEST test strip USE TO CHECK BLOOD SUGAR 4X A DAY (B08.9)  5  . Blood Glucose Calibration (ONETOUCH VERIO) SOLN LEVEL 3 (MID) AS DIRECTED USE TO CHECK GLUCOSE METER IN VITRO    . Blood Glucose Monitoring Suppl (ONETOUCH VERIO FLEX SYSTEM) w/Device KIT USE TO CHECK BLOOD SUGARS USE TO CHECK BLOOD SUGARS DX  E08.9 FINGERSTICK    . calcium carbonate (TUMS EX) 750 MG chewable tablet Chew 2-4 tablets by mouth 2 (two) times daily as needed for heartburn. No more than 10 a day    . carboxymethylcellulose (REFRESH PLUS) 0.5 % SOLN 1-2 drops every 2 (two) hours as needed for Dry eyes    . diclofenac sodium (VOLTAREN) 1 % GEL APPLY 3 GRAMS TO 3 LARGE JOINTS UP TO 3 TIMES DAILY AS NEEDED 300 g 3  . HYDROcodone-acetaminophen (NORCO/VICODIN) 5-325 MG per tablet Take 1-2 tablets by mouth every 6 (six) hours as needed for moderate pain.     Marland Kitchen ibuprofen (ADVIL,MOTRIN) 200 MG tablet Take 400-800 mg by mouth every 8 (eight) hours as needed for moderate pain.     . Insulin Glargine (BASAGLAR KWIKPEN) 100 UNIT/ML SOPN Inject 22 Units into the skin 2 (two) times daily. 26 units am, 22 units pm    . insulin lispro (INSULIN LISPRO) 100 UNIT/ML KwikPen Junior Inject into the skin.    . Magnesium 300 MG CAPS Take 300 mg by mouth every evening.     Marland Kitchen  MICROLET LANCETS MISC AS DIRECTED FOUR TIMES A DAY 30 DAYS  2  . mometasone-formoterol (DULERA) 100-5 MCG/ACT AERO Inhale 2 puffs into the lungs in the morning and at bedtime. 13 g 5  . Multiple Vitamins-Minerals (ALIVE WOMENS GUMMY PO) Take 2 tablets by mouth daily after breakfast.     . omeprazole (PRILOSEC) 20 MG capsule Take 20 mg by mouth every morning.     Marland Kitchen OVER THE COUNTER MEDICATION daily.    . polyethylene glycol (MIRALAX / GLYCOLAX) packet Take 17 g by mouth daily as needed for mild constipation.    . Probiotic Product (PROBIOTIC DAILY PO) Take 1 capsule by mouth daily. Reported on 01/16/2016    . rizatriptan (MAXALT) 10 MG tablet Take 10 mg by mouth as needed for migraine. May repeat in 2 hours if needed for headaches    .  ZENPEP 25000 units CPEP Take 1-3 capsules by mouth 3 (three) times daily with meals as needed. 3-4 caps per meal and 1-2 caps per snack  11  . budesonide-formoterol (SYMBICORT) 160-4.5 MCG/ACT inhaler Inhale 2 puffs into the lungs in the morning and at bedtime. 1 Inhaler 12  . FLOVENT HFA 110 MCG/ACT inhaler TAKE 2 PUFFS BY MOUTH TWICE A DAY 12 Inhaler 5   No facility-administered medications prior to visit.     Review of Systems:   Constitutional:   No  weight loss, night sweats,  Fevers, chills, fatigue, or  lassitude.  HEENT:   No headaches,  Difficulty swallowing,  Tooth/dental problems, or  Sore throat,                No sneezing, itching, ear ache, nasal congestion, post nasal drip,   CV:  No chest pain,  Orthopnea, PND, swelling in lower extremities, anasarca, dizziness, palpitations, syncope.   GI  No heartburn, indigestion, abdominal pain, nausea, vomiting, diarrhea, change in bowel habits, loss of appetite, bloody stools.   Resp:    No chest wall deformity  Skin: no rash or lesions.  GU: no dysuria, change in color of urine, no urgency or frequency.  No flank pain, no hematuria   MS:  No joint pain or swelling.  No decreased range of motion.   No back pain.    Physical Exam  BP 134/78 (BP Location: Left Arm, Cuff Size: Normal)   Pulse 79   Temp (!) 96.3 F (35.7 C) (Temporal)   Ht _0  (1.626 m)   Wt 165 lb 9.6 oz (75.1 kg)   SpO2 99% Comment: RA  BMI 28.43 kg/m   GEN: A/Ox3; pleasant , NAD, well nourished    HEENT:  [place]/AT,  NOSE-clear, THROAT-clear, no lesions, no postnasal drip or exudate noted.   NECK:  Supple w/ fair ROM; no JVD; normal carotid impulses w/o bruits; no thyromegaly or nodules palpated; no lymphadenopathy.    RESP  Clear  P & A; w/o, wheezes/ rales/ or rhonchi. no accessory muscle use, no dullness to percussion  CARD:  RRR, no m/r/g,  No  peripheral edema, pulses intact, no cyanosis or clubbing.  GI:   Soft & nt; nml bowel sounds; no organomegaly or masses detected.   Musco: Warm bil, no deformities or joint swelling noted.   Neuro: alert, no focal deficits noted.    Skin: Warm, no lesions or rashes     BMET  BNP No results found for: BNP  ProBNP No results found for: PROBNP  Imaging: No results found.    PFT Results Latest Ref Rng & Units 11/19/2016  FVC-Pre L 3.45  FVC-Predicted Pre % 90  FVC-Post L 3.62  FVC-Predicted Post % 95  Pre FEV1/FVC % % 71  Post FEV1/FCV % % 77  FEV1-Pre L 2.47  FEV1-Predicted Pre % 79  FEV1-Post L 2.81  DLCO uncorrected ml/min/mmHg 18.56  DLCO UNC% % 72  DLCO corrected ml/min/mmHg 19.31  DLCO COR %Predicted % 75  DLVA Predicted % 79  TLC L 5.15  TLC % Predicted % 98  RV % Predicted % 80    Lab Results  Component Value Date   NITRICOXIDE 20 01/16/2016        Assessment & Plan:   Asthma Improved control on Dulera   Plan  Patient Instructions  Continue on Dulera 2 puffs Twice daily  , rinse after use.  Albuterol inhaler 2 puffs every 6hrs as needed  for wheezing , this is your rescue inhaler.  Activity as tolerated .  Chest xray today .  Follow up with Dr. Lamonte Sakai 4 to 6 months  and As needed         Fibromyalgia FM  and Costochondritis - cont f/up with PCP  Chest xray today per request .  Please contact office for sooner follow up if symptoms do not improve or worsen or seek emergency care       Rexene Edison, NP 08/30/2020

## 2020-08-30 NOTE — Assessment & Plan Note (Signed)
FM and Costochondritis - cont f/up with PCP  Chest xray today per request .  Please contact office for sooner follow up if symptoms do not improve or worsen or seek emergency care

## 2020-08-30 NOTE — Assessment & Plan Note (Signed)
Improved control on [facility]   Plan  Patient Instructions  Continue on Dulera 2 puffs Twice daily  , rinse after use.  Albuterol inhaler 2 puffs every 6hrs as needed for wheezing , this is your rescue inhaler.  Activity as tolerated .  Chest xray today .  Follow up with Dr. Delton Coombes 4 to 6 months  and As needed

## 2020-08-31 NOTE — Progress Notes (Signed)
Patient returned call, provided patient with results per Rubye Oaks NP.  She verified understanding.  Nothing further needed.

## 2020-09-02 NOTE — Progress Notes (Signed)
Office Visit Note  Patient: Heather FlingRebekah Wagner             Date of Birth: 1976/11/10           MRN: [id number]             PCP: Heather Wagner, Elaine, MD Referring: Heather Wagner, Elaine, MD Visit Date: 09/15/2020 Occupation: @GUAROCC @  Subjective:  Costochondritis   History of Present Illness: Heather Wagner is a 44 y.o. female with history of Sjogren's syndrome, osteoarthritis, and fibromyalgia.  She continues to have generalized myalgias and muscle tenderness due to underlying fibromyalgia.  She has been going to integrative therapy several days a week which has improved some of her generalized pain.  She continues to have persistent discomfort due to left trochanteric bursitis.  She has been performing stretching exercises as well as using the ultrasound at PT which has been helpful.  She has been experiencing constant pain due to costochondritis.  She was evaluated by her pulmonologist and is scheduled for a chest CT on 09/23/2020.  She has been trying to perform stretching exercises on a daily basis.  She has also been applying CBD as well as magnesium cream topically as needed.  She has started walking on a daily basis for exercise.  She is also been working on weight loss by starting to eat clean.    Activities of Daily Living:  Patient reports morning stiffness for 1-2 hours.   Patient Reports nocturnal pain.  Difficulty dressing/grooming: Denies Difficulty climbing stairs: Reports Difficulty getting out of chair: Reports Difficulty using hands for taps, buttons, cutlery, and/or writing: Denies  Review of Systems  Constitutional: Positive for fatigue.  HENT: Positive for mouth dryness and nose dryness. Negative for mouth sores.   Eyes: Positive for dryness. Negative for pain and visual disturbance.  Respiratory: Positive for shortness of breath (Hx of asthma). Negative for cough, hemoptysis and difficulty breathing.   Cardiovascular: Positive for chest pain (Costochondral pain) and  palpitations. Negative for hypertension, irregular heartbeat and swelling in legs/feet.  Gastrointestinal: Positive for blood in stool, constipation and diarrhea.  Endocrine: Negative for increased urination.  Genitourinary: Negative for painful urination.  Musculoskeletal: Positive for arthralgias, joint pain, joint swelling, morning stiffness and muscle tenderness. Negative for myalgias, muscle weakness and myalgias.  Skin: Positive for sensitivity to sunlight. Negative for color change, pallor, rash, hair loss, nodules/bumps, skin tightness and ulcers.  Allergic/Immunologic: Negative for susceptible to infections.  Neurological: Negative for dizziness, numbness, headaches and weakness.  Hematological: Negative for swollen glands.  Psychiatric/Behavioral: Positive for sleep disturbance. Negative for depressed mood. The patient is not nervous/anxious.     PMFS History:  Patient Active Problem List   Diagnosis Date Noted  . Intractable pain   . Acute on chronic pancreatitis (HCC) 10/28/2017  . DDD (degenerative disc disease), cervical 07/12/2017  . DDD (degenerative disc disease), lumbar 07/12/2017  . Primary insomnia 05/30/2017  . Other fatigue 05/30/2017  . ANA positive 04/19/2017  . History of chronic sinusitis 04/19/2017  . Fibromyalgia 10/24/2016  . Osteoarthritis of both knees 10/24/2016  . Sjogren's syndrome (HCC) 10/24/2016  . IBS (irritable bowel syndrome) 10/24/2016  . Migraines 10/24/2016  . Chronic sinusitis 10/24/2016  . Pelvic floor dysfunction 10/24/2016  . Vulvar vestibulitis 10/24/2016  . Dehydration 07/11/2016  . Fatty liver 07/11/2016  . Elevated LFTs 07/11/2016  . 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 Abdominal pain 01/05/2016  . Pancreatitis, acute   . CAP (community acquired pneumonia) 06/19/2014  . Pain management  06/18/2014  . Chronic pancreatitis (HCC) 06/18/2014  . Intractable migraine without aura 01/25/2014  . Acute pancreatitis  09/25/2013  . Leucocytosis 09/25/2013  . Microcytic anemia 09/25/2013  . Asthma 09/25/2013  . Type 2 diabetes mellitus without complication, with long-term current use of insulin (HCC) 09/25/2013  . Hyponatremia 09/25/2013    Past Medical History:  Diagnosis Date  . Asthma   . Bursitis   . Chronic sinusitis   . DDD (degenerative disc disease), cervical   . DDD (degenerative disc disease), lumbar   . Diabetes mellitus    Type 1   . Fibromyalgia   . Hyperlipidemia   . IBS (irritable bowel syndrome)   . Migraine without aura, with intractable migraine, so stated, without mention of status migrainosus 01/25/2014  . Migraines   . Osteoarthritis of both knees 10/24/2016   moderate  . Pancreatitis chronic   . Pelvic floor dysfunction 10/24/2016  . Sjogren's syndrome (HCC)   . Vulvar vestibulitis     Family History  Problem Relation Age of Onset  . Asthma Mother   . Pancreatic cancer Mother   . High Cholesterol Father   . Macular degeneration Father   . Migraines Sister   . Endometriosis Sister   . Asthma Brother   . GER disease Brother   . Pancreatitis Paternal Grandfather   . Pancreatic cancer Other   . Pancreatitis Maternal Uncle    Past Surgical History:  Procedure Laterality Date  . ABDOMINAL HYSTERECTOMY  05/27/2020   [facility] - per patient  . BREAST REDUCTION SURGERY  2010  . DILATION AND CURETTAGE OF UTERUS  2007  . ERCP     Removed stone from Bile duct  . ERCP     with MRI  . PANCREAS SURGERY  1997  . whiple  1997  . whipple's     Social History   Social History Narrative  . Not on file   Immunization History  Administered Date(s) Administered  . Influenza Split 10/08/2018  . Influenza-Unspecified 09/02/2014     Objective: Vital Signs: BP 115/77 (BP Location: Left Arm, Patient Position: Sitting, Cuff Size: Small)   Pulse 90   Ht 5\' 4"  (1.626 m)   Wt 164 lb 9.6 oz (74.7 kg)   LMP 04/03/2020   BMI 28.25 kg/m    Physical Exam Vitals and nursing  note reviewed.  Constitutional:      Appearance: She is well-developed.  HENT:     Head: Normocephalic and atraumatic.  Eyes:     Conjunctiva/sclera: Conjunctivae normal.  Pulmonary:     Effort: Pulmonary effort is normal.  Abdominal:     Palpations: Abdomen is soft.  Musculoskeletal:     Cervical back: Normal range of motion.  Skin:    General: Skin is warm and dry.     Capillary Refill: Capillary refill takes less than 2 seconds.  Neurological:     Mental Status: She is alert and oriented to person, place, and time.  Psychiatric:        Behavior: Behavior normal.      Musculoskeletal Exam: Generalized hyperalgesia and positive tender points on exam.  C-spine good ROM.  Shoulder joints, ligaments, wrist joints, MCPs, PIPs, and DIPs have good range of motion with no synovitis.  She is able to make a complete fist bilaterally.  Right hip has good range of motion with no discomfort.  Limited range of motion of the left hip.  She has tenderness palpation over the left IT band and  trochanteric bursa.  Knee joints have good range of motion with no warmth or effusion.  She has tenderness along the medial joint line of the left knee joint.  Ankle joints have good range of motion with no tenderness or inflammation.  Mild thickening of first MTP joints noted.  No tenderness of MTP joints noted on exam.  CDAI Exam: CDAI Score: -- Patient Global: --; Provider Global: -- Swollen: --; Tender: -- Joint Exam 09/15/2020   No joint exam has been documented for this visit   There is currently no information documented on the homunculus. Go to the Rheumatology activity and complete the homunculus joint exam.  Investigation: No additional findings.  Imaging: DG Chest 2 View  Result Date: 08/30/2020 CLINICAL DATA:  Asthma. EXAM: CHEST - 2 VIEW COMPARISON:  12/14/2018 FINDINGS: The cardiomediastinal contours are normal. No evidence of pneumomediastinum. Peribronchial thickening. Pulmonary  vasculature is normal. No consolidation, pleural effusion, or pneumothorax. No acute osseous abnormalities are seen. IMPRESSION: Peribronchial thickening suggesting asthma or bronchitis. Electronically Signed   By: Narda Rutherford M.D.   On: 08/30/2020 17:51    Recent Labs: Lab Results  Component Value Date   WBC 14.6 (H) 04/08/2020   HGB 10.0 (L) 04/08/2020   PLT 513 (H) 04/08/2020   NA 134 (L) 04/08/2020   K 3.2 (L) 04/08/2020   CL 98 04/08/2020   CO2 25 04/08/2020   GLUCOSE 130 (H) 04/08/2020   BUN 13 04/08/2020   CREATININE 0.61 04/08/2020   BILITOT 0.3 04/08/2020   ALKPHOS 74 04/08/2020   AST 17 04/08/2020   ALT 17 04/08/2020   PROT 7.8 04/08/2020   ALBUMIN 3.9 04/08/2020   CALCIUM 8.9 04/08/2020   GFRAA >60 04/08/2020    Speciality Comments: No specialty comments available.  Procedures:  No procedures performed Allergies: Ciprofloxacin hcl, Nortriptyline hcl, Amitriptyline, Compazine [prochlorperazine maleate], Cymbalta [duloxetine hcl], Dexilant [dexlansoprazole], Doxycycline, Flagyl [metronidazole hcl], Gabapentin, Penicillins, Penicillins cross reactors, Septra [sulfamethoxazole-trimethoprim], Tizanidine, Topamax [topiramate], Toradol [ketorolac tromethamine], Zofran [ondansetron hcl], Amoxicillin, Breo ellipta [fluticasone furoate-vilanterol], Diflucan [fluconazole], Inapsine [droperidol], Ketamine, Levofloxacin, Lyrica [pregabalin], Phenergan [promethazine hcl], Phenothiazines, Primaxin [imipenem], Prozac [fluoxetine hcl], and Reglan [metoclopramide hcl]   Assessment / Plan:     Visit Diagnoses: Fibromyalgia: She has generalized hyperalgesia and positive tender points on exam.  She has been experiencing progressively worsening muscle tenderness and myalgias in the intercostal muscles for the past 1 year.  Her tenderness and discomfort have become constant and severe.  At times her pain is an 8 out of 10.  She has been going to integrative therapies and has been working on  stretching exercises without significant relief.  She has also tried topical agents which only provide temporary relief. She is scheduled for a chest CT ordered by her pulmonologist on 09/23/2020 for further evaluation.  She was advised to have these results forwarded to our office.  Overall she has noticed a significant improvement in her generalized pain since starting to go to integrative therapies.  She continues to have discomfort due to left trochanteric bursitis but the stretching exercises as well as ultrasound have alleviated some of her discomfort.  She purchased a new mattress and plans on using a mattress topper, which will likely be helpful.  She has started walking on a daily basis.  We discussed the importance of regular exercise and good sleep hygiene.  She will follow-up in the office in 6 months.  Primary insomnia: She has interrupted sleep at night due to nocturnal pain.  She  recently purchased a new mattress but has not tried it yet.  She plans on using a mattress Topper for extra support and cushion.  Other fatigue: She has chronic fatigue secondary to insomnia.  She is started walking on a daily basis. We discussed the importance of regular exercise.  Primary osteoarthritis of both knees: She has intermittent discomfort in both knee joints.  She has tenderness along the joint line of the left knee joint.  She uses Voltaren gel topically as needed for pain relief.  She has good range of motion of both knee joints on exam with no warmth or effusion.  Sjogren's syndrome with other organ involvement (HCC) - Negative ANA, positive SSB. She has chronic sicca symptoms.  She has increased her fluid intake which has been making her mouth dryness tolerable.  She has been using refresh eyedrops as needed for symptomatic relief.  We discussed the use of a humidifier during the winter. She has not developed any other new or worsening symptoms.  DDD (degenerative disc disease), cervical: She has  good ROM of the c-spine on exam today.  No symptoms of radiculopathy.   DDD (degenerative disc disease), lumbar: She has intermittent discomfort in her lower back but most of her discomfort has been in the thoracic region recently.  She has been using topical magnesium and CBD cream.  Trochanteric bursitis of left hip: She has tenderness palpation over the left trochanteric bursa and IT band. Her discomfort is exacerbated by lying on her left side at night. She has been going to integrative therapies and has been performing stretching exercises as well as using the ultrasound to alleviate some of her discomfort. She has purchased a new rafters but has not tried it yet but plans on using a mattress topper as well. She was encouraged to continue to perform stretching exercises on a daily basis.  Other medical conditions are listed as follows:   History of migraine  Fatty liver  Pelvic floor dysfunction  History of pancreatitis - Hereditary, followed up at [facility]  History of diabetes mellitus  History of IBS  Orders: No orders of the defined types were placed in this encounter.  No orders of the defined types were placed in this encounter.    Follow-Up Instructions: Return in about 6 months (around 03/16/2021) for Fibromyalgia, Sjogren's syndrome.   Gearldine Bienenstock, PA-C  Note - This record has been created using Dragon software.  Chart creation errors have been sought, but may not always  have been located. Such creation errors do not reflect on  the standard of medical care.

## 2020-09-07 ENCOUNTER — Other Ambulatory Visit: Payer: Self-pay | Admitting: Family Medicine

## 2020-09-07 DIAGNOSIS — R079 Chest pain, unspecified: Secondary | ICD-10-CM

## 2020-09-15 ENCOUNTER — Other Ambulatory Visit: Payer: Self-pay

## 2020-09-15 ENCOUNTER — Encounter: Payer: Self-pay | Admitting: Physician Assistant

## 2020-09-15 ENCOUNTER — Ambulatory Visit (INDEPENDENT_AMBULATORY_CARE_PROVIDER_SITE_OTHER): Payer: Managed Care, Other (non HMO) | Admitting: Physician Assistant

## 2020-09-15 VITALS — BP 115/77 | HR 90 | Ht 64.0 in | Wt 164.6 lb

## 2020-09-15 DIAGNOSIS — F5101 Primary insomnia: Secondary | ICD-10-CM

## 2020-09-15 DIAGNOSIS — M797 Fibromyalgia: Secondary | ICD-10-CM | POA: Diagnosis not present

## 2020-09-15 DIAGNOSIS — Z8639 Personal history of other endocrine, nutritional and metabolic disease: Secondary | ICD-10-CM

## 2020-09-15 DIAGNOSIS — R5383 Other fatigue: Secondary | ICD-10-CM | POA: Diagnosis not present

## 2020-09-15 DIAGNOSIS — M5136 Other intervertebral disc degeneration, lumbar region: Secondary | ICD-10-CM

## 2020-09-15 DIAGNOSIS — M6289 Other specified disorders of muscle: Secondary | ICD-10-CM

## 2020-09-15 DIAGNOSIS — M170 Bilateral primary osteoarthritis of knee: Secondary | ICD-10-CM | POA: Diagnosis not present

## 2020-09-15 DIAGNOSIS — K760 Fatty (change of) liver, not elsewhere classified: Secondary | ICD-10-CM

## 2020-09-15 DIAGNOSIS — M5030 Other cervical disc degeneration, unspecified cervical region: Secondary | ICD-10-CM

## 2020-09-15 DIAGNOSIS — M3509 Sicca syndrome with other organ involvement: Secondary | ICD-10-CM

## 2020-09-15 DIAGNOSIS — M51369 Other intervertebral disc degeneration, lumbar region without mention of lumbar back pain or lower extremity pain: Secondary | ICD-10-CM

## 2020-09-15 DIAGNOSIS — Z8669 Personal history of other diseases of the nervous system and sense organs: Secondary | ICD-10-CM

## 2020-09-15 DIAGNOSIS — Z8719 Personal history of other diseases of the digestive system: Secondary | ICD-10-CM

## 2020-09-15 DIAGNOSIS — M7062 Trochanteric bursitis, left hip: Secondary | ICD-10-CM

## 2020-09-23 ENCOUNTER — Other Ambulatory Visit: Payer: Managed Care, Other (non HMO)

## 2020-10-07 ENCOUNTER — Ambulatory Visit
Admission: RE | Admit: 2020-10-07 | Discharge: 2020-10-07 | Disposition: A | Discharge status: Home / Self Care | Payer: Managed Care, Other (non HMO) | Source: Ambulatory Visit | Attending: Family Medicine | Admitting: Family Medicine

## 2020-10-07 ENCOUNTER — Other Ambulatory Visit: Payer: Self-pay

## 2020-10-07 DIAGNOSIS — R079 Chest pain, unspecified: Secondary | ICD-10-CM

## 2020-10-07 MED ORDER — IOPAMIDOL (ISOVUE-300) INJECTION 61%
75.0000 mL | Freq: Once | INTRAVENOUS | Status: AC | PRN
  Administered 2020-10-07: 75 mL via INTRAVENOUS

## 2020-10-17 ENCOUNTER — Encounter: Payer: Self-pay | Admitting: Rheumatology

## 2020-10-26 MED ORDER — BUDESONIDE-FORMOTEROL FUMARATE 80-4.5 MCG/ACT IN AERO
2.0000 | INHALATION_SPRAY | Freq: Two times a day (BID) | RESPIRATORY_TRACT | 12 refills | Status: DC
Start: 2020-10-26 — End: 2021-11-01

## 2020-10-26 NOTE — Telephone Encounter (Signed)
Tammy, please see mychart message sent by pt and advise. 

## 2020-10-26 NOTE — Telephone Encounter (Signed)
Sorry to hear this , we can try Symbicort 80 2 puffs Twice daily

## 2020-10-26 NOTE — Telephone Encounter (Signed)
Rx has been sent to pharmacy for pt. Pt has been notified. Nothing further needed.

## 2020-11-03 ENCOUNTER — Telehealth (HOSPITAL_COMMUNITY): Payer: Self-pay | Admitting: Family

## 2020-11-03 DIAGNOSIS — R69 Illness, unspecified: Secondary | ICD-10-CM

## 2020-11-03 NOTE — Telephone Encounter (Signed)
Sorry to hear that you are positive for COVID-19 You are definitely at risk for complications of COVID-19 because of your medical history if you are interested there is a treatment that we recommend called monoclonal antibody infusion I can refer you to that group to see if you qualify you have to receive it within 10 days of symptoms so we would need to refer you today to see if they could possibly work you in the next day or 2  Otherwise I recommend treating the symptoms lots of fluids rest treat the fever with Tylenol if you are able to take this.  Robitussin or Mucinex if able to take as needed for cough and congestion. If symptoms worsen you become more short of breath have low oxygen levels, cough up blood high fevers or unable to eat or drink that he would need to seek emergency room care.  Continue to quarantine for your full 10 days.  Please contact office for sooner follow up if symptoms do not improve or worsen or seek emergency care

## 2020-11-03 NOTE — Telephone Encounter (Signed)
Spoke with the patient as she called the office to talk about the infusion. States that she is really nervous about it and has a rather long list of allergies. She is concerned about possibly having a reaction to it and wants to know if it would like to know if it will affect her at all with her asthma, autoimmune disorder, diabetes, fibromyalgia, etc. I have reached out to the [facility] team to get her on the list and they have responded to get her set up but patient would like any other suggestions we have as she is very nervous/anxious.  Tammy and Dr. Delton Coombes please advise

## 2020-11-03 NOTE — Telephone Encounter (Signed)
I agree that she would qualify for monoclonal Ab treatment, and that we should refer her if she wants to receive inside the 10 day window.

## 2020-11-03 NOTE — Telephone Encounter (Signed)
Suggestions as previously stated, supportvie care for symptoms  Cont w/ pulmonary maintenance meds Please contact office for sooner follow up if symptoms do not improve or worsen or seek emergency care

## 2020-11-03 NOTE — Telephone Encounter (Signed)
Notified pt via MyChart of Tammy's recommendation. Will leave encounter open to see if pt would like referral.

## 2020-11-03 NOTE — Telephone Encounter (Signed)
Called to discuss with [name] about Covid symptoms and potential candidacy for the use of sotrovimab, a combination monoclonal antibody infusion for those with mild to moderate Covid symptoms and at a high risk of hospitalization.     Pt is qualified for this infusion at the infusion center due to co-morbid conditions and/or a member of an at-risk group, however unable to reach patient as there is no VM box available.   Airam Runions,NP

## 2020-11-03 NOTE — Telephone Encounter (Signed)
Tammy could you please advise on following MyChart message: Hello, Ms. Heather Wagner. I hope you had a good Thanksgiving. I wanted to contact  you because I was diagnosed with Covid 19. I got sick last Wednesday, November 24th- developed a fever and cold/flu symptoms. I had a fever for five days and then it subsided. I am having a lot of issues with coughing and trouble breathing the last few days. Trying to stay calm and just relax. I lost my sense of smell and have very limited taste. My main concern is lung function and breathing. Any advice you have would be greatly appreciated. I am home resting and trying to drink lots of fluids. I was able to get the refill of Dulera which is definitely working better than the [employer]. Is there anything else you think would be helpful for me? Heather Wagner Thank you

## 2020-11-04 ENCOUNTER — Telehealth: Payer: Self-pay | Admitting: Unknown Physician Specialty

## 2020-11-04 ENCOUNTER — Other Ambulatory Visit: Payer: Self-pay | Admitting: Unknown Physician Specialty

## 2020-11-04 ENCOUNTER — Encounter (HOSPITAL_COMMUNITY): Payer: Self-pay

## 2020-11-04 ENCOUNTER — Emergency Department (HOSPITAL_COMMUNITY): Payer: Managed Care, Other (non HMO)

## 2020-11-04 ENCOUNTER — Encounter: Payer: Self-pay | Admitting: Unknown Physician Specialty

## 2020-11-04 ENCOUNTER — Other Ambulatory Visit: Payer: Self-pay

## 2020-11-04 ENCOUNTER — Telehealth: Payer: Self-pay | Admitting: Adult Health

## 2020-11-04 ENCOUNTER — Emergency Department (HOSPITAL_COMMUNITY)
Admission: EM | Admit: 2020-11-04 | Discharge: 2020-11-04 | Disposition: A | Discharge status: Home / Self Care | Payer: Managed Care, Other (non HMO) | Attending: Emergency Medicine | Admitting: Emergency Medicine

## 2020-11-04 DIAGNOSIS — Z7951 Long term (current) use of inhaled steroids: Secondary | ICD-10-CM | POA: Diagnosis not present

## 2020-11-04 DIAGNOSIS — E119 Type 2 diabetes mellitus without complications: Secondary | ICD-10-CM | POA: Diagnosis not present

## 2020-11-04 DIAGNOSIS — Z794 Long term (current) use of insulin: Secondary | ICD-10-CM

## 2020-11-04 DIAGNOSIS — J4541 Moderate persistent asthma with (acute) exacerbation: Secondary | ICD-10-CM | POA: Diagnosis not present

## 2020-11-04 DIAGNOSIS — U071 COVID-19: Secondary | ICD-10-CM

## 2020-11-04 DIAGNOSIS — R000 Tachycardia, unspecified: Secondary | ICD-10-CM | POA: Insufficient documentation

## 2020-11-04 DIAGNOSIS — J4530 Mild persistent asthma, uncomplicated: Secondary | ICD-10-CM

## 2020-11-04 DIAGNOSIS — R059 Cough, unspecified: Secondary | ICD-10-CM | POA: Diagnosis present

## 2020-11-04 DIAGNOSIS — M3500 Sicca syndrome, unspecified: Secondary | ICD-10-CM

## 2020-11-04 MED ORDER — BENZONATATE 100 MG PO CAPS: 100.0000 mg | ORAL_CAPSULE | Freq: Three times a day (TID) | ORAL | 0 refills | Status: DC

## 2020-11-04 MED ORDER — ALBUTEROL SULFATE HFA 108 (90 BASE) MCG/ACT IN AERS
2.0000 | INHALATION_SPRAY | Freq: Once | RESPIRATORY_TRACT | Status: AC
  Administered 2020-11-04: 2 via RESPIRATORY_TRACT
  Filled 2020-11-04: qty 6.7

## 2020-11-04 MED ORDER — DEXAMETHASONE 6 MG PO TABS: 6.0000 mg | ORAL_TABLET | Freq: Two times a day (BID) | ORAL | 0 refills | Status: DC

## 2020-11-04 MED ORDER — DEXAMETHASONE 4 MG PO TABS
6.0000 mg | ORAL_TABLET | Freq: Once | ORAL | Status: AC
  Administered 2020-11-04: 6 mg via ORAL
  Filled 2020-11-04: qty 1

## 2020-11-04 NOTE — ED Triage Notes (Signed)
Pt reports testing positive for COVID on Monday. Pt reports hx of asthma and states that she's been having trouble with her breathing. Pt reports using home medications with minimal relief. Pts pulmonologist suggested coming to ER today.

## 2020-11-04 NOTE — Telephone Encounter (Signed)
Called patient patient on the way to the emergency room with worsening asthma symptoms. Advised her to keep [place] updated

## 2020-11-04 NOTE — Telephone Encounter (Signed)
I connected by phone with Heather Wagner on 11/04/2020 at 12:08 PM to discuss the potential use of a new treatment for mild to moderate COVID-19 viral infection in non-hospitalized patients.  This patient is a 44 y.o. female that meets the FDA criteria for Emergency Use Authorization of COVID monoclonal antibody casirivimab/imdevimab, bamlanivimab/eteseviamb, or sotrovimab.  Has a (+) direct SARS-CoV-2 viral test result  Has mild or moderate COVID-19   Is NOT hospitalized due to COVID-19  Is within 10 days of symptom onset  Has at least one of the high risk factor(s) for progression to severe COVID-19 and/or hospitalization as defined in EUA.  Specific high risk criteria : Chronic Lung Disease   I have spoken and communicated the following to the patient or parent/caregiver regarding COVID monoclonal antibody treatment:  1. FDA has authorized the emergency use for the treatment of mild to moderate COVID-19 in adults and pediatric patients with positive results of direct SARS-CoV-2 viral testing who are 76 years of age and older weighing at least 40 kg, and who are at high risk for progressing to severe COVID-19 and/or hospitalization.  2. The significant known and potential risks and benefits of COVID monoclonal antibody, and the extent to which such potential risks and benefits are unknown.  3. Information on available alternative treatments and the risks and benefits of those alternatives, including clinical trials.  4. Patients treated with COVID monoclonal antibody should continue to self-isolate and use infection control measures (e.g., wear mask, isolate, social distance, avoid sharing personal items, clean and disinfect "high touch" surfaces, and frequent handwashing) according to CDC guidelines.   5. The patient or parent/caregiver has the option to accept or refuse COVID monoclonal antibody treatment.  After reviewing this information with the patient, the patient has agreed  to receive one of the available covid 19 monoclonal antibodies and will be provided an appropriate fact sheet prior to infusion. Gabriel Cirri, NP 11/04/2020 12:08 PM  Sx onset Nov 24

## 2020-11-04 NOTE — ED Provider Notes (Signed)
[facility] DEPT Provider Note   CSN: [id number] Arrival date & time: 11/04/20  1557    History Chief Complaint  Patient presents with  . Covid Positive  . Asthma    Armando Noy is a 44 y.o. female with past medical history significant for DDD, migraine, asthma, anemia who presents for evaluation of shortness of breath.  Tested positive for Covid on Monday.  Symptoms started last Wednesday.  States she has been using her albuterol inhaler every 4 hours that she feels short of breath.  Is also on Dulera.  Not been on recent steroids.  Denies prior intubations for asthma.  Was admitted "many years ago for asthma.  Contacted pulmonologist who referred her here for evaluation.  No recent steroids.  She is not vaccinated against Covid.  Denies headache, lightheadedness, dizziness, chest pain, hemoptysis abdominal pain, diarrhea, dysuria unilateral leg swelling, redness or warmth.  No recent surgery, ambulation, malignancy.  No prior history of PE or DVT.  Denies additional aggravating or alleviating factors. States she feel anxious here in the ED however feels like her breathing has improved on arrival to the ED.  History obtained from patient and past medical records. No interpreter used.  HPI     Past Medical History:  Diagnosis Date  . Asthma   . Bursitis   . Chronic sinusitis   . DDD (degenerative disc disease), cervical   . DDD (degenerative disc disease), lumbar   . Diabetes mellitus    Type 1   . Fibromyalgia   . Hyperlipidemia   . IBS (irritable bowel syndrome)   . Migraine without aura, with intractable migraine, so stated, without mention of status migrainosus 01/25/2014  . Migraines   . Osteoarthritis of both knees 10/24/2016   moderate  . Pancreatitis chronic   . Pelvic floor dysfunction 10/24/2016  . Sjogren's syndrome ([place])   . Vulvar vestibulitis     Patient Active Problem List   Diagnosis Date Noted  . Intractable pain   .  Acute on chronic pancreatitis ([place]) 10/28/2017  . DDD (degenerative disc disease), cervical 07/12/2017  . DDD (degenerative disc disease), lumbar 07/12/2017  . Primary insomnia 05/30/2017  . Other fatigue 05/30/2017  . ANA positive 04/19/2017  . History of chronic sinusitis 04/19/2017  . Fibromyalgia 10/24/2016  . Osteoarthritis of both knees 10/24/2016  . Sjogren's syndrome ([place]) 10/24/2016  . IBS (irritable bowel syndrome) 10/24/2016  . Migraines 10/24/2016  . Chronic sinusitis 10/24/2016  . Pelvic floor dysfunction 10/24/2016  . Vulvar vestibulitis 10/24/2016  . Dehydration 07/11/2016  . Fatty liver 07/11/2016  . Elevated LFTs 07/11/2016  . 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 Abdominal pain 01/05/2016  . Pancreatitis, acute   . CAP (community acquired pneumonia) 06/19/2014  . Pain management 06/18/2014  . Chronic pancreatitis ([place]) 06/18/2014  . Intractable migraine without aura 01/25/2014  . Acute pancreatitis 09/25/2013  . Leucocytosis 09/25/2013  . Microcytic anemia 09/25/2013  . Asthma 09/25/2013  . Type 2 diabetes mellitus without complication, with long-term current use of insulin ([place]) 09/25/2013  . Hyponatremia 09/25/2013    Past Surgical History:  Procedure Laterality Date  . ABDOMINAL HYSTERECTOMY  05/27/2020   [facility] - per patient  . BREAST REDUCTION SURGERY  2010  . DILATION AND CURETTAGE OF UTERUS  2007  . ERCP     Removed stone from Bile duct  . ERCP     with MRI  . PANCREAS SURGERY  1997  . whiple  1997  .  whipple's       OB History   No obstetric history on file.     Family History  Problem Relation Age of Onset  . Asthma Mother   . Pancreatic cancer Mother   . High Cholesterol Father   . Macular degeneration Father   . Migraines Sister   . Endometriosis Sister   . Asthma Brother   . GER disease Brother   . Pancreatitis Paternal Grandfather   . Pancreatic cancer Other   . Pancreatitis Maternal Uncle     Social History     Tobacco Use  . Smoking status: Never Smoker  . Smokeless tobacco: Never Used  Vaping Use  . Vaping Use: Never used  Substance Use Topics  . Alcohol use: No    Alcohol/week: 0.0 standard drinks  . Drug use: No    Home Medications Prior to Admission medications   Medication Sig Start Date End Date Taking? Authorizing Provider  albuterol (VENTOLIN HFA) 108 (90 Base) MCG/ACT inhaler Inhale 1-2 puffs into the lungs every 6 (six) hours as needed for wheezing. Asthma 08/30/20   Parrett, Fonnie Mu, NP  BAYER CONTOUR NEXT TEST test strip USE TO CHECK BLOOD SUGAR 4X A DAY (B08.9) 09/27/16   [provider]  benzonatate (TESSALON) 100 MG capsule Take 1 capsule (100 mg total) by mouth every 8 (eight) hours. 11/04/20   Lakiyah Arntson A, PA-C  Blood Glucose Calibration (ONETOUCH VERIO) SOLN LEVEL 3 (MID) AS DIRECTED USE TO CHECK GLUCOSE METER IN VITRO 12/11/19   [provider]  Blood Glucose Monitoring Suppl ([place]) w/Device KIT USE TO CHECK BLOOD SUGARS USE TO CHECK BLOOD SUGARS DX  E08.9 FINGERSTICK 12/10/19   [provider]  budesonide-formoterol (SYMBICORT) 80-4.5 MCG/ACT inhaler Inhale 2 puffs into the lungs in the morning and at bedtime. 10/26/20   Parrett, Fonnie Mu, NP  calcium carbonate (TUMS EX) 750 MG chewable tablet Chew 2-4 tablets by mouth 2 (two) times daily as needed for heartburn. No more than 10 a day    [provider]  carboxymethylcellulose (REFRESH PLUS) 0.5 % SOLN 1-2 drops every 2 (two) hours as needed for Dry eyes    [provider]  dexamethasone (DECADRON) 6 MG tablet Take 1 tablet (6 mg total) by mouth 2 (two) times daily with a meal. 11/04/20   Caelin Rayl A, PA-C  diclofenac sodium (VOLTAREN) 1 % GEL APPLY 3 GRAMS TO 3 LARGE JOINTS UP TO 3 TIMES DAILY AS NEEDED 11/03/18   Bo Merino, MD  Docusate Sodium (DSS) 100 MG CAPS Take 1 capsule by mouth as needed.    [provider]   HYDROcodone-acetaminophen (NORCO/VICODIN) 5-325 MG per tablet Take 1-2 tablets by mouth every 6 (six) hours as needed for moderate pain.     [provider]  ibuprofen (ADVIL,MOTRIN) 200 MG tablet Take 400-800 mg by mouth every 8 (eight) hours as needed for moderate pain.     [provider]  Insulin Glargine (BASAGLAR KWIKPEN) 100 UNIT/ML SOPN Inject 22 Units into the skin 2 (two) times daily. 26 units am, 22 units pm    [provider]  insulin lispro (INSULIN LISPRO) 100 UNIT/ML KwikPen Junior Inject into the skin.    [provider]  Magnesium 300 MG CAPS Take 300 mg by mouth every evening.     [provider]  MICROLET LANCETS MISC AS DIRECTED FOUR TIMES A DAY 30 DAYS 08/05/16   [provider]  mometasone-formoterol [facility])  100-5 MCG/ACT AERO Inhale 2 puffs into the lungs in the morning and at bedtime. 05/13/20   Parrett, Fonnie Mu, NP  Multiple Vitamins-Minerals (ALIVE WOMENS GUMMY PO) Take 2 tablets by mouth daily after breakfast.     [provider]  omeprazole (PRILOSEC) 20 MG capsule Take 20 mg by mouth every morning.     [provider]  OVER THE COUNTER MEDICATION daily.    [provider]  polyethylene glycol (MIRALAX / GLYCOLAX) packet Take 17 g by mouth daily as needed for mild constipation.    [provider]  Probiotic Product (PROBIOTIC DAILY PO) Take 1 capsule by mouth daily. Reported on 01/16/2016    [provider]  rizatriptan (MAXALT) 10 MG tablet Take 10 mg by mouth as needed for migraine. May repeat in 2 hours if needed for headaches    [provider]  ZENPEP 25000 units CPEP Take 1-3 capsules by mouth 3 (three) times daily with meals as needed. 3-4 caps per meal and 1-2 caps per snack 12/03/15   [provider]    Allergies    Ciprofloxacin hcl, Nortriptyline hcl, Amitriptyline, Compazine [prochlorperazine maleate], Cymbalta [duloxetine hcl], Dexilant  [dexlansoprazole], Doxycycline, Flagyl [metronidazole hcl], Gabapentin, Penicillins, Penicillins cross reactors, Septra [sulfamethoxazole-trimethoprim], Tizanidine, Topamax [topiramate], Toradol [ketorolac tromethamine], Zofran [ondansetron hcl], Amoxicillin, Breo ellipta [fluticasone furoate-vilanterol], Diflucan [fluconazole], Inapsine [droperidol], Ketamine, Levofloxacin, Lyrica [pregabalin], Phenergan [promethazine hcl], Phenothiazines, Primaxin [imipenem], Prozac [fluoxetine hcl], and Reglan [metoclopramide hcl]  Review of Systems   Review of Systems  Constitutional: Negative.   HENT: Negative.   Respiratory: Positive for cough, shortness of breath and wheezing. Negative for apnea, choking, chest tightness and stridor.   Cardiovascular: Negative.   Gastrointestinal: Negative.   Genitourinary: Negative.   Musculoskeletal: Negative.   Skin: Negative.   Neurological: Negative.   All other systems reviewed and are negative.   Physical Exam Updated Vital Signs BP (!) 125/96   Pulse 98   Temp 98.8 F (37.1 C) (Oral)   Resp 19   LMP 04/03/2020   SpO2 99%   Physical Exam Vitals and nursing note reviewed.  Constitutional:      General: She is not in acute distress.    Appearance: She is well-developed. She is not ill-appearing, toxic-appearing or diaphoretic.  HENT:     Head: Normocephalic and atraumatic.     Nose: Nose normal.     Mouth/Throat:     Mouth: Mucous membranes are moist.  Eyes:     Pupils: Pupils are equal, round, and reactive to light.  Cardiovascular:     Rate and Rhythm: Tachycardia present.     Pulses: Normal pulses.     Comments: HR 101 in room Pulmonary:     Effort: No respiratory distress.     Breath sounds: Wheezing present.     Comments: Mild expiratory wheeze through out. Speaks in full sentences without difficulty Chest:     Chest wall: No tenderness.  Abdominal:     General: Bowel sounds are normal. There is no distension.     Tenderness: There  is no abdominal tenderness. There is no right CVA tenderness, left CVA tenderness, guarding or rebound.  Musculoskeletal:        General: Normal range of motion.     Cervical back: Normal range of motion.     Comments: Moves all 4 extremities without difficulty. Compartments soft. Homans sign negative  Skin:    General: Skin is warm and dry.     Capillary Refill:  Capillary refill takes less than 2 seconds.  Neurological:     General: No focal deficit present.     Mental Status: She is alert and oriented to person, place, and time.     ED Results / Procedures / Treatments   Labs (all labs ordered are listed, but only abnormal results are displayed) Labs Reviewed - No data to display  EKG EKG Interpretation  Date/Time:  Friday November 04 2020 17:39:11 EST Ventricular Rate:  98 PR Interval:    QRS Duration: 99 QT Interval:  362 QTC Calculation: 463 R Axis:   73 Text Interpretation: Sinus rhythm Low voltage, precordial leads 12 Lead; [place] Confirmed by Quintella Reichert [phone]) on 11/04/2020 5:49:42 PM   Radiology DG Chest Portable 1 View  Result Date: 11/04/2020 CLINICAL DATA:  COVID-19 positive 5 days ago, asthma, the heart approving EXAM: PORTABLE CHEST 1 VIEW COMPARISON:  08/30/2020 FINDINGS: Single frontal view of the chest demonstrates an unremarkable cardiac silhouette. No airspace disease, effusion, or pneumothorax. No acute bony abnormalities. IMPRESSION: 1. No acute intrathoracic process. Electronically Signed   By: Randa Ngo M.D.   On: 11/04/2020 17:41    Procedures Procedures (including critical care time)  Medications Ordered in ED Medications  dexamethasone (DECADRON) tablet 6 mg (6 mg Oral Given 11/04/20 1733)  albuterol (VENTOLIN HFA) 108 (90 Base) MCG/ACT inhaler 2 puff (2 puffs Inhalation Given 11/04/20 1733)    ED Course  I have reviewed the triage vital signs and the nursing notes.  Pertinent labs & imaging results that were available during my  care of the patient were reviewed by me and considered in my medical decision making (see chart for details).  44 year old presents for evaluation of shortness of breath.  She is afebrile, nonseptic, non-ill-appearing.  Recent Covid diagnosis, unvaccinated.  She is mildly tachycardic to 101 in room however does not appear in acute respiratory distress.  Does states she feels very anxious being here in the ED.  Has been using her home albuterol and Dulera without difficulty.  No recent steroids.  Does have mild expiratory wheeze throughout.  Speaks in full sentences.  Her heart is clear.  Abdomen soft, nontender.  She is nonfocal neuro exam without deficits.  Unilateral leg swelling, redness or warmth.  Compartments soft.  Bevelyn Buckles' sign negative.  No clinical evidence of DVT on exam.  Will treat with steroids, albuterol, obtain chest x-ray, EKG and reassess.  DG chest without acute infiltrates, cardiomegaly, pulmonary edema, pneumothorax EKG without ischemia  Patient reassessed.  Her lungs are clear to auscultation bilaterally.  Tachycardia has improved.  She is ambulatory that any hypoxia, dyspnea on exertion.  DC home with outpatient steroids, Tessalon Perles.  She continues to deny wanting MAB for her Covid infection.  I have low suspicion for acute ACS, PE, dissection, acute bacterial infectious process. Discussed strict return precautions.  The patient has been appropriately medically screened and/or stabilized in the ED. I have low suspicion for any other emergent medical condition which would require further screening, evaluation or treatment in the ED or require inpatient management.  Patient is hemodynamically stable and in no acute distress.  Patient able to ambulate in department prior to ED.  Evaluation does not show acute pathology that would require ongoing or additional emergent interventions while in the emergency department or further inpatient treatment.  I have discussed the diagnosis  with the patient and answered all questions.  Pain is been managed while in the emergency department and patient has no  further complaints prior to discharge.  Patient is comfortable with plan discussed in room and is stable for discharge at this time.  I have discussed strict return precautions for returning to the emergency department.  Patient was encouraged to follow-up with PCP/specialist refer to at discharge.    MDM Rules/Calculators/A&P                          [place] Searles was evaluated in Emergency Department on 11/04/2020 for the symptoms described in the history of present illness. She was evaluated in the context of the global COVID-19 pandemic, which necessitated consideration that the patient might be at risk for infection with the SARS-CoV-2 virus that causes COVID-19. Institutional protocols and algorithms that pertain to the evaluation of patients at risk for COVID-19 are in a state of rapid change based on information released by regulatory bodies including the CDC and federal and state organizations. These policies and algorithms were followed during the patient's care in the ED. Final Clinical Impression(s) / ED Diagnoses Final diagnoses:  COVID  Moderate persistent asthma with exacerbation    Rx / DC Orders ED Discharge Orders         Ordered    benzonatate (TESSALON) 100 MG capsule  Every 8 hours        11/04/20 1802    dexamethasone (DECADRON) 6 MG tablet  2 times daily with meals        11/04/20 1802           Ladrea Holladay A, PA-C 11/04/20 1807    Quintella Reichert, MD 11/05/20 [phone]

## 2020-11-04 NOTE — Telephone Encounter (Signed)
Will close encounter

## 2020-11-04 NOTE — Telephone Encounter (Signed)
Called patient.  She is wondering if she can take her albuterol inhaler more than every 4 hours.    Tammy please advise  She thinks infusion therapy is trying ot call her while on phone with me

## 2020-11-04 NOTE — Discharge Instructions (Signed)
May take your home medications for your anxiety  I have written you for Jerilynn Som to help with your cough  May use your albuterol inhaler every 2 hours  Take the steroids, Decadron as prescribed, once daily  Return for new or worsening symptoms

## 2020-11-05 ENCOUNTER — Ambulatory Visit (HOSPITAL_COMMUNITY): Payer: Managed Care, Other (non HMO) | Attending: Pulmonary Disease

## 2020-11-05 ENCOUNTER — Telehealth (HOSPITAL_COMMUNITY): Payer: Self-pay

## 2020-11-05 NOTE — Telephone Encounter (Signed)
Patient called in stating she would like to cancel today's appointment for monoclonal antibody infusion at 1430.   Gave patient number and address to [facility].

## 2020-11-07 NOTE — Telephone Encounter (Signed)
Tammy could you please advise on following My Chart message: Hello, Ms. Heather Wagner. I wanted to give you an update on my symptoms and trip to the Emergency Room Friday. I was there several hours and given a four day prescription of 6 mg Dexamethasone (she told me to take once a day) to help open up my lungs. Also given Benzonatate 100 mg to help reduce coughing for seven days. I am also taking Delsym and my inhalers. I am still very short of breath and winded. The ER told me to reach out to you if I needed more steroids. Could you please send over a refill to get me through the next few days? The prescription says to take twice a day. It does seem to be helping. Probably twice a day will help me more. Please let me know what you think, Heather Wagner [phone] hm Thank you

## 2020-11-07 NOTE — Telephone Encounter (Signed)
That is fine , finish current taper -do twice daily for 3 days then Begin Decadron 4mg  Twice daily  For 3 days then 1 daily for 3 days and stop .   Needs ov /televisit in 1 week for evaluation   Please contact office for sooner follow up if symptoms do not improve or worsen or seek emergency care

## 2020-11-08 MED ORDER — DEXAMETHASONE 6 MG PO TABS
6.0000 mg | ORAL_TABLET | Freq: Two times a day (BID) | ORAL | 0 refills | Status: DC
Start: 2020-11-08 — End: 2020-12-13

## 2020-11-08 MED ORDER — DEXAMETHASONE 4 MG PO TABS: ORAL_TABLET | ORAL | 0 refills | Status: AC

## 2020-11-08 NOTE — Telephone Encounter (Signed)
Tammy, please see new mychart message sent by pt and advise on refill of medication for pt.  I have scheduled her a televisit with you 12/14.

## 2020-11-08 NOTE — Telephone Encounter (Signed)
Yes I approved it yesterday , see previous message.

## 2020-11-09 ENCOUNTER — Telehealth: Payer: Self-pay | Admitting: Adult Health

## 2020-11-09 NOTE — Telephone Encounter (Signed)
11/09/20  Contacted patient over the phone.  She reports that she has been taking 6 mg of Decadron twice daily.  The second p.m. dose is causing her have symptoms of dizziness as well as lightheadedness and irritability.  Patient is wondering if she could decrease down to 4 mg of Decadron twice daily due to the symptoms.  Plan: Yes decrease down to 4 mg dosing twice daily Also try to take p.m. dose in mid afternoon at latest to avoid insomnia  Patient to contact our office back if she is continuing to have worsened symptoms  Keep upcoming appointment with TP NP on 11/15/2020 is a televisit We will route to TP NP as FYI  Nothing further needed at this time  Elisha Headland FNP

## 2020-11-11 IMAGING — CT CT ABD-PELV W/ CM
2 of 5 series · 15 of 46 positions shown, 17 images · IV contrast (Omnipaque)
Comparison: CT 10/29/2017

CLINICAL DATA: Chronic pancreatitis with epigastric pain, nausea
and vomiting. History of Whipple with partial pancreatectomy.

EXAM:
CT ABDOMEN AND PELVIS WITH CONTRAST
TECHNIQUE: Multidetector CT imaging of the abdomen and pelvis was performed
using the standard protocol following bolus administration of
intravenous contrast.
CONTRAST:  100mL OMNIPAQUE IOHEXOL 300 MG/ML  SOLN

[Series 2: axial st · axial · 0.90mm/px · z∈[-470,-30]mm · 12 of 100 slices shown, 14 images]
[im 6/100  soft-tissue]
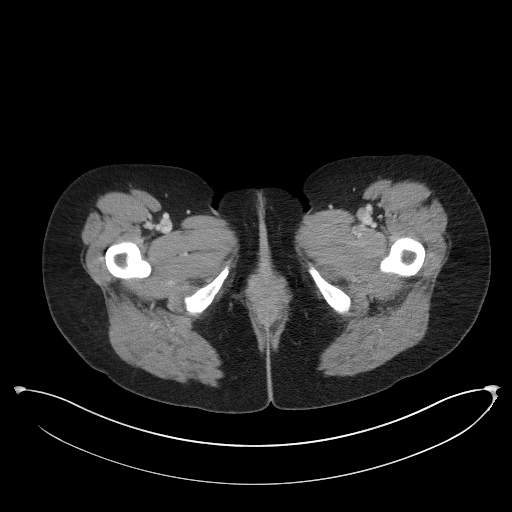
[im 6/100  bone]
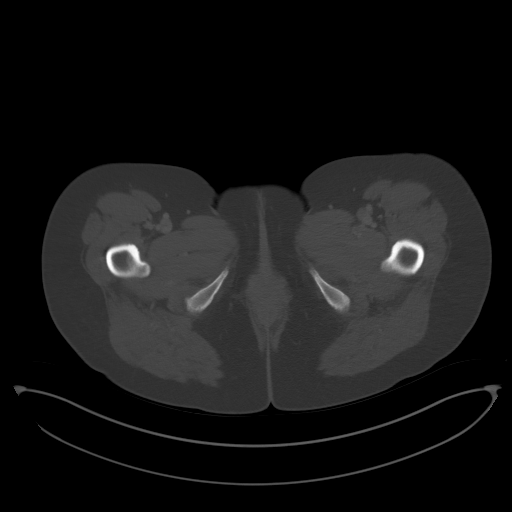
[im 16/100  soft-tissue]
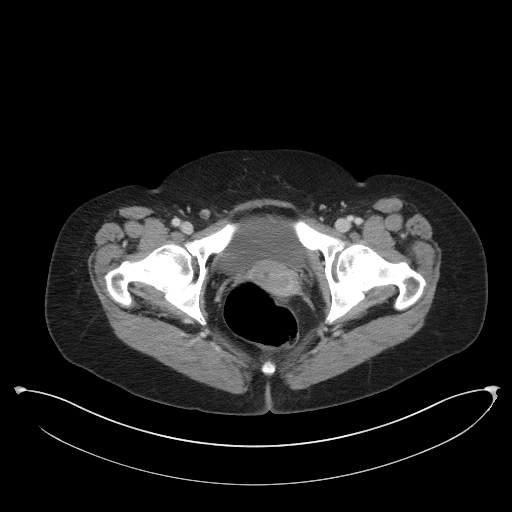
[im 21/100  soft-tissue]
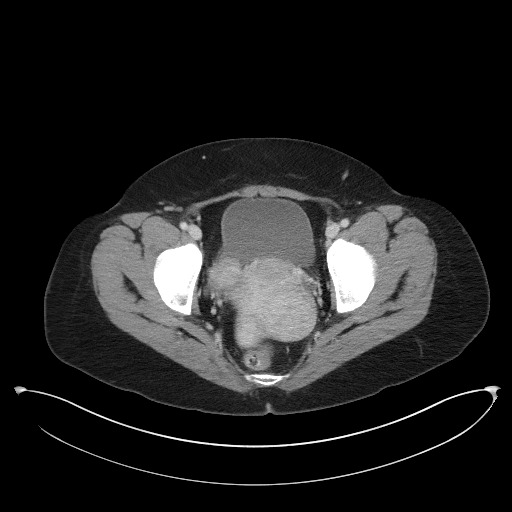
[im 32/100  soft-tissue]
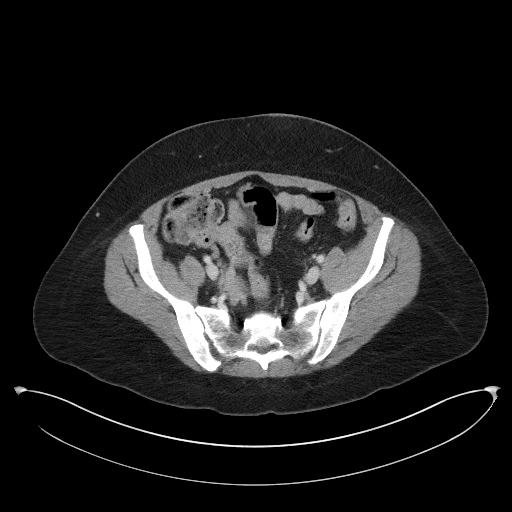
[im 37/100  soft-tissue]
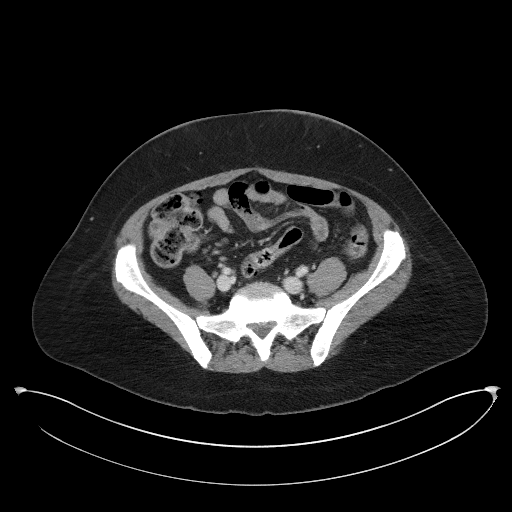
[im 47/100  soft-tissue]
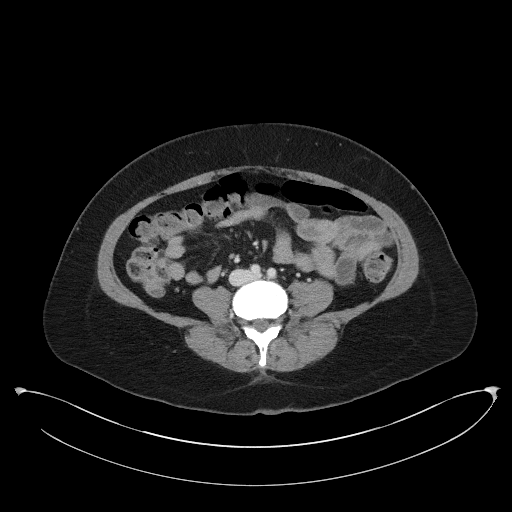
[im 53/100  soft-tissue]
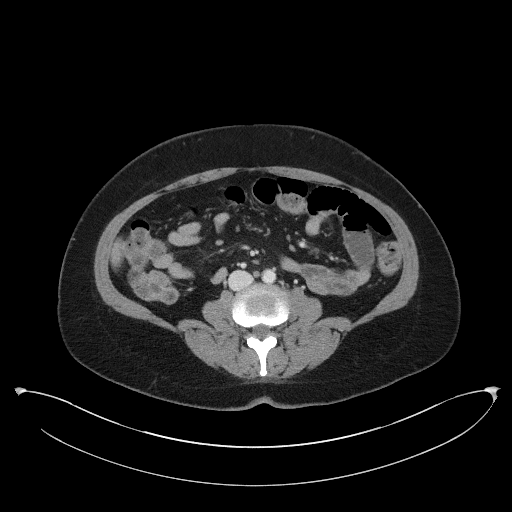
[im 63/100  soft-tissue]
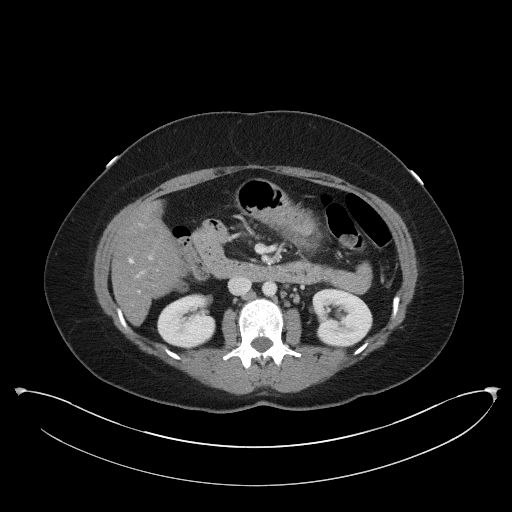
[im 68/100  soft-tissue]
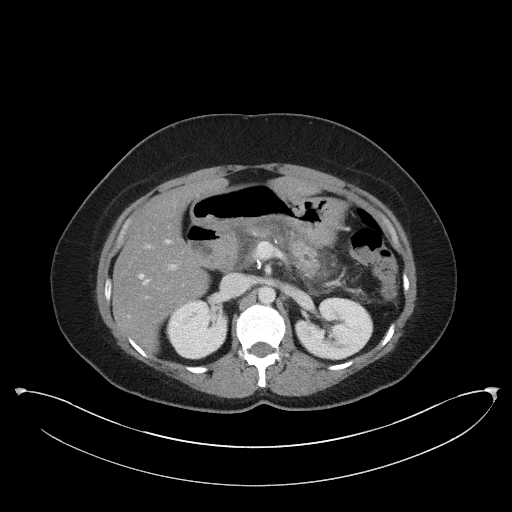
[im 68/100  bone]
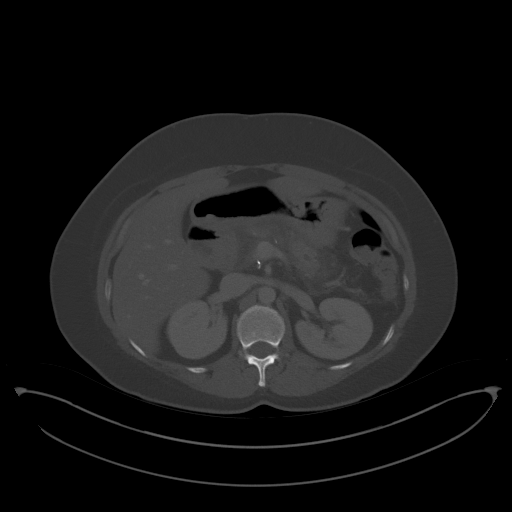
[im 79/100  soft-tissue]
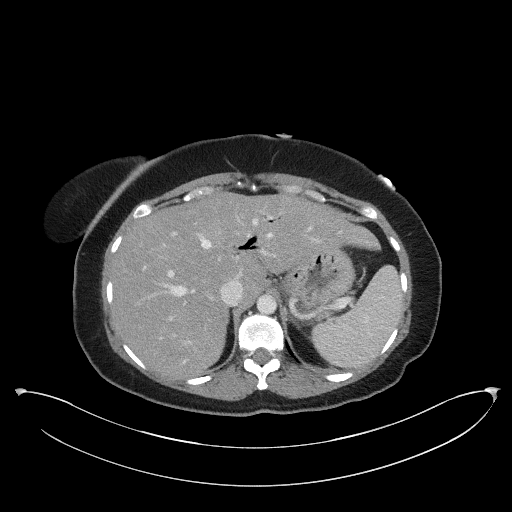
[im 84/100  soft-tissue]
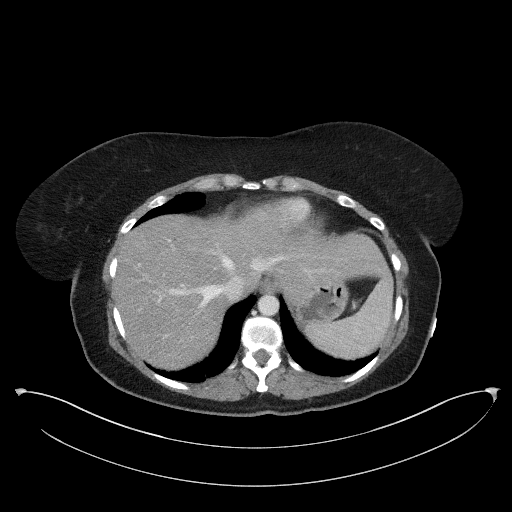
[im 94/100  soft-tissue]
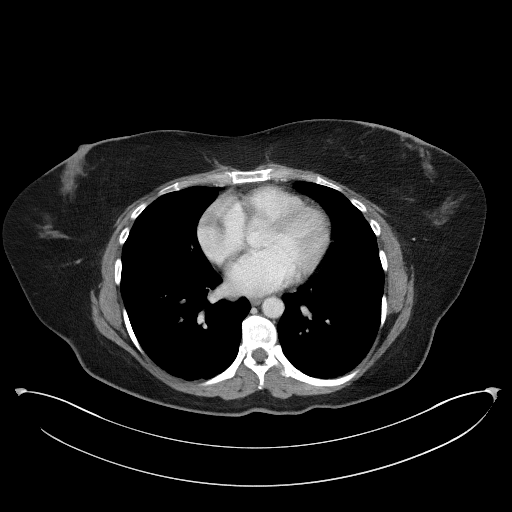

[Series 5: coronal st · coronal · 0.83mm/px · 3 of 82 slices shown]
[im 28/82  soft-tissue]
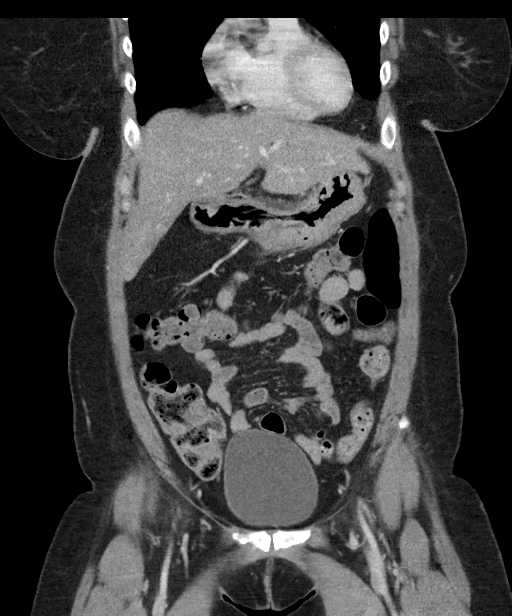
[im 37/82  soft-tissue]
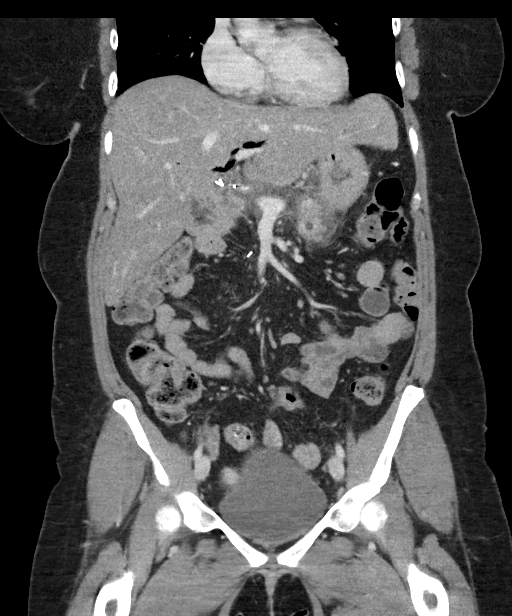
[im 46/82  soft-tissue]
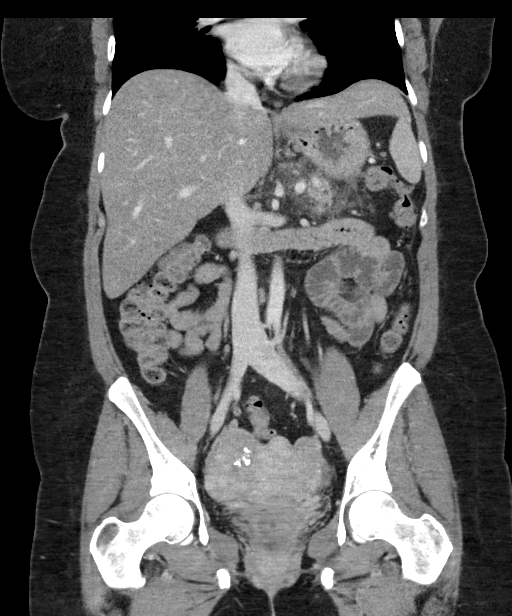

[15 of 46 positions shown; findings below may reference images not displayed]

FINDINGS: Lower chest: Lung bases are clear. Normal heart size. No pericardial
effusion.

Hepatobiliary: Diffuse hepatic hypoattenuation compatible with
hepatic steatosis. There is pneumobilia within the anti dependent
portions of the left lobe liver. Patient is post Whipple's procedure
with removal of the gallbladder and reimplantation of the common
bile duct.

Pancreas: Pancreas is anastomose directly to the gastric body. There
is extensive surrounding peripancreatic inflammation without
organized collection or abscess. Mild pancreatic ductal dilatation
is present. No visible necrosis of the pancreatic remnant. Some
associated thickening of the greater curvature of the stomach is
present as well. Inflammatory changes within the retroperitoneum
with some likely reactive fluid in the lesser sac.

Spleen: Normal in size without focal abnormality.

Adrenals/Urinary Tract: Normal adrenals. Kidneys enhance and excrete
symmetrically. No suspicious renal lesion, urolithiasis or
hydronephrosis. Bladder is unremarkable.

Stomach/Bowel: Postsurgical changes from prior Whipple with evidence
of prior proximal pancreatico duodenectomy and reimplantation with
gastroduodenostomy and direct pancreaticogastrostomy. There is
edematous mural thickening along the greater curvature at the
pancreatic implantation site. Anastomoses are otherwise
unremarkable. No distal small bowel thickening or dilatation. A
normal appendix is visualized. No colonic dilatation or wall
thickening.

Vascular/Lymphatic: The aorta is normal caliber. No acute vascular
abnormality. Reactive adenopathy present in the upper abdomen. No
pathologically enlarged abdominopelvic nodes.

Reproductive: Multiple fibroids within the uterus, several of which
appear enlarged and more heterogeneous than on prior with several
demonstrating coarse calcification. No concerning adnexal lesions.

Other: Inflammatory changes of the upper abdomen as above with
stranding about the pancreas and small amount of reactive fluid in
the lesser sac. No abdominopelvic free air or fluid elsewhere. Soft
tissue and skin thickening of the anterior abdominal wall likely
related to injectable use.

Musculoskeletal: No acute osseous abnormality or suspicious osseous
lesion.
IMPRESSION: 1. Postsurgical changes from prior Whipple's procedure with evidence
of prior proximal pancreaticoduodenectomy.
2. There is evidence of acute interstitial edematous pancreatitis of
the pancreatic remnant which is anastomose directly to the gastric
body (pancreatico duodenostomy). No evidence of necrosis or
organized peripancreatic. Edematous thickening of the stomach is
likely reactive with small amount of fluid in the retroperitoneum
and lesser sac.
3. Pneumobilia likely related to biliary reimplantation.
4. Hepatic steatosis.
5. Fibroid uterus.

## 2020-11-15 ENCOUNTER — Ambulatory Visit (INDEPENDENT_AMBULATORY_CARE_PROVIDER_SITE_OTHER): Payer: Managed Care, Other (non HMO) | Admitting: Adult Health

## 2020-11-15 ENCOUNTER — Other Ambulatory Visit: Payer: Self-pay

## 2020-11-15 ENCOUNTER — Encounter: Payer: Self-pay | Admitting: Adult Health

## 2020-11-15 DIAGNOSIS — J4540 Moderate persistent asthma, uncomplicated: Secondary | ICD-10-CM

## 2020-11-15 DIAGNOSIS — U071 COVID-19: Secondary | ICD-10-CM | POA: Diagnosis not present

## 2020-11-15 NOTE — Progress Notes (Addendum)
Virtual Visit via Telephone Note  I connected with Heather Wagner on 11/15/20 at  3:00 PM EST by telephone and verified that I am speaking with the correct person using two identifiers.  Location: Patient: Home  Provider: Office    I discussed the limitations, risks, security and privacy concerns of performing an evaluation and management service by telephone and the availability of in person appointments. I also discussed with the patient that there may be a patient responsible charge related to this service. The patient expressed understanding and agreed to proceed.   History of Present Illness: 44 year old female never smoker followed for asthma and allergic rhinitis .  Medical history significant for chronic pancreatitis and Sjogren's disease , DM , Fibromyalgia   Patient's televisit today is a follow-up for COVID-19.  Patient has underlying asthma is maintained on Dulera twice daily.  Says she had been doing well up until 2 to 3 weeks ago.  She started having upper respiratory infections.  She tested positive for COVID-19 on December 2.  She was referred to the monoclonal antibody infusion team however declined infusion.  Patient has had nasal congestion, fatigue, dizziness, palpitations, general malaise cough and congestion. She was seen in the emergency room on November 04, 2020.  She was given a prednisone taper.  Patient says she is starting to feel better but she continues to have some ongoing symptoms.  Especially with dizziness and lightheadedness.  Her prednisone dose was decreased and she is currently on Decadron 4 mg daily.  She has 2 additional days left.  O2 saturations have been good on room air anywhere between 95 to 99% on room air.  Chest x-ray in the emergency room was reviewed and showed no acute findings.  Appetite is good with no nausea vomiting diarrhea.  She does have underlying diabetes blood sugars have been averaging anywhere between low 100s to 200.  She denies any  chest pain or calf pain.    Current Outpatient Medications on File Prior to Visit  Medication Sig Dispense Refill  . albuterol (VENTOLIN HFA) 108 (90 Base) MCG/ACT inhaler Inhale 1-2 puffs into the lungs every 6 (six) hours as needed for wheezing. Asthma 18 g 6  . BAYER CONTOUR NEXT TEST test strip USE TO CHECK BLOOD SUGAR 4X A DAY (B08.9)  5  . benzonatate (TESSALON) 100 MG capsule Take 1 capsule (100 mg total) by mouth every 8 (eight) hours. 21 capsule 0  . Blood Glucose Calibration (ONETOUCH VERIO) SOLN LEVEL 3 (MID) AS DIRECTED USE TO CHECK GLUCOSE METER IN VITRO    . Blood Glucose Monitoring Suppl (ONETOUCH VERIO FLEX SYSTEM) w/Device KIT USE TO CHECK BLOOD SUGARS USE TO CHECK BLOOD SUGARS DX  E08.9 FINGERSTICK    . budesonide-formoterol (SYMBICORT) 80-4.5 MCG/ACT inhaler Inhale 2 puffs into the lungs in the morning and at bedtime. 10.2 g 12  . calcium carbonate (TUMS EX) 750 MG chewable tablet Chew 2-4 tablets by mouth 2 (two) times daily as needed for heartburn. No more than 10 a day    . carboxymethylcellulose (REFRESH PLUS) 0.5 % SOLN 1-2 drops every 2 (two) hours as needed for Dry eyes    . dexamethasone (DECADRON) 6 MG tablet Take 1 tablet (6 mg total) by mouth 2 (two) times daily with a meal. 6 tablet 0  . diclofenac sodium (VOLTAREN) 1 % GEL APPLY 3 GRAMS TO 3 LARGE JOINTS UP TO 3 TIMES DAILY AS NEEDED 300 g 3  . Docusate Sodium (DSS) 100   MG CAPS Take 1 capsule by mouth as needed.    Marland Kitchen HYDROcodone-acetaminophen (NORCO/VICODIN) 5-325 MG per tablet Take 1-2 tablets by mouth every 6 (six) hours as needed for moderate pain.     Marland Kitchen ibuprofen (ADVIL,MOTRIN) 200 MG tablet Take 400-800 mg by mouth every 8 (eight) hours as needed for moderate pain.    . Insulin Glargine (BASAGLAR KWIKPEN) 100 UNIT/ML SOPN Inject 22 Units into the skin 2 (two) times daily. 26 units am, 22 units pm    . insulin lispro (INSULIN LISPRO) 100 UNIT/ML KwikPen Junior Inject into the skin.    . Magnesium 300 MG CAPS  Take 300 mg by mouth every evening.     Marland Kitchen MICROLET LANCETS MISC AS DIRECTED FOUR TIMES A DAY 30 DAYS  2  . mometasone-formoterol (DULERA) 100-5 MCG/ACT AERO Inhale 2 puffs into the lungs in the morning and at bedtime. 13 g 5  . Multiple Vitamins-Minerals (ALIVE WOMENS GUMMY PO) Take 2 tablets by mouth daily after breakfast.     . omeprazole (PRILOSEC) 20 MG capsule Take 20 mg by mouth every morning.     Marland Kitchen OVER THE COUNTER MEDICATION daily.    . polyethylene glycol (MIRALAX / GLYCOLAX) packet Take 17 g by mouth daily as needed for mild constipation.    . Probiotic Product (PROBIOTIC DAILY PO) Take 1 capsule by mouth daily. Reported on 01/16/2016    . rizatriptan (MAXALT) 10 MG tablet Take 10 mg by mouth as needed for migraine. May repeat in 2 hours if needed for headaches    . ZENPEP 25000 units CPEP Take 1-3 capsules by mouth 3 (three) times daily with meals as needed. 3-4 caps per meal and 1-2 caps per snack  11   No current facility-administered medications on file prior to visit.    Observations/Objective: BS 130  O2 sats 96-99% on room air 12/3 Chest xray clear   Speaks in full sentences with no audible distress.  Assessment and Plan: Asthma exacerbation secondary to COVID-19 infection.  Patient is improving on steroids.  Have advised her to slowly taper off of her Decadron as planned over the next 2 days.  Continue on her maintenance regimen.  We will have her follow-up in the office for an in person visit in 2 days.  COVID-19 infection.  Emergency room visit December 3 showed clear x-ray.  O2 saturations have maintained in the mid to upper 90s on room air.  No apparent respiratory distress.  Dizziness, fatigue, malaise suspect are secondary to COVID-19 infection.  Patient is to push fluids.  Healthy diet.  Advance activity as tolerated.  Rest and supportive care. She does have a office visit with her primary care provider with labs later this week.  Plan  Patient Instructions   Taper off decadron . Push fluids .  Continue on Dulera 2 puffs Twice daily  , rinse after use.  Albuterol inhaler 2 puffs every 6hrs as needed for wheezing , this is your rescue inhaler.  Activity as tolerated .  Follow up with Dr. Lamonte Sakai or Annett Boxwell in 2 weeks and as needed   Please contact office for sooner follow up if symptoms do not improve or worsen or seek emergency care   Follow up with Dr. Laurann Montana as planned this week        Follow Up Instructions:    I discussed the assessment and treatment plan with the patient. The patient was provided an opportunity to ask questions and all were answered. The patient  agreed with the plan and demonstrated an understanding of the instructions.   The patient was advised to call back or seek an in-person evaluation if the symptoms worsen or if the condition fails to improve as anticipated.  I provided 22 minutes of non-face-to-face time during this encounter.   Tammy Parrett, NP   

## 2020-11-15 NOTE — Patient Instructions (Addendum)
Taper off decadron . Push fluids .  Continue on Dulera 2 puffs Twice daily  , rinse after use.  Albuterol inhaler 2 puffs every 6hrs as needed for wheezing , this is your rescue inhaler.  Activity as tolerated .  Follow up with Dr. Delton Coombes or Akirra Lacerda in 2 weeks and as needed   Please contact office for sooner follow up if symptoms do not improve or worsen or seek emergency care   Follow up with Dr. Valentina Lucks as planned this week

## 2020-12-13 ENCOUNTER — Other Ambulatory Visit: Payer: Self-pay

## 2020-12-13 ENCOUNTER — Ambulatory Visit (INDEPENDENT_AMBULATORY_CARE_PROVIDER_SITE_OTHER): Payer: Managed Care, Other (non HMO) | Admitting: Adult Health

## 2020-12-13 ENCOUNTER — Encounter: Payer: Self-pay | Admitting: Adult Health

## 2020-12-13 DIAGNOSIS — J4530 Mild persistent asthma, uncomplicated: Secondary | ICD-10-CM

## 2020-12-13 NOTE — Assessment & Plan Note (Signed)
Recent asthma exacerbation now resolved. Patient is returning back to baseline  Plan  Patient Instructions  Continue on Dulera 2 puffs Twice daily  , rinse after use. (change to Symbicort 80 2 puffs Twice daily  - when done with [facility] )  Albuterol inhaler 2 puffs every 6hrs as needed for wheezing , this is your rescue inhaler.  Activity as tolerated .  Follow up with Dr. Delton Coombes or Antha Niday in 3 months and As needed

## 2020-12-13 NOTE — Progress Notes (Signed)
_0  ID: Heather Wagner, female    DOB: June 28, 1976, 45 y.o.   MRN: [id number]  Chief Complaint  Patient presents with  . Follow-up    Referring provider: Kelton Pillar, MD  HPI: 45 year old female never smoker followed for asthma and allergic rhinitis Medical history significant for chronic pancreatitis and Sjogren's disease, diabetes, fibromyalgia COVID-19 infection early December 2021.  TEST/EVENTS :   12/13/2020 Follow up ; Asthma, [place] , Covid 19 (Dec 21) Patient returns for a 1 month follow-up.  Patient was seen last visit for a slow to resolve asthma exacerbation after having COVID-19 infection in early December 2021.  She was treated in the emergency room with a steroid taper.  Since then patient says she is feeling better with improvement in her breathing.  Cough is decreased.  Energy level is starting to return back to her baseline. Patient has underlying asthma.  She remains on Dulera twice daily.  She is having to switch to Symbicort because Heather Wagner is going off the market. She denies any hemoptysis chest pain orthopnea PND or increased leg swelling    Allergies  Allergen Reactions  . Ciprofloxacin Hcl Other (See Comments)    Flare up of pancreatitis, severe yeast infection   . Nortriptyline Hcl Shortness Of Breath    Swelling anxiety  . Amitriptyline Other (See Comments)    sedation  . Compazine [Prochlorperazine Maleate] Other (See Comments)    Neurological - uncontrollable eye movements  . Cymbalta [Duloxetine Hcl] Nausea And Vomiting    insomnia  . Dexilant [Dexlansoprazole] Nausea Only  . Doxycycline Nausea And Vomiting  . Flagyl [Metronidazole Hcl] Nausea And Vomiting    Skin rash  . Gabapentin Nausea And Vomiting and Other (See Comments)    Agitation  . Penicillins   . Penicillins Cross Reactors Nausea And Vomiting    Skin rash/hives  . Septra [Sulfamethoxazole-Trimethoprim] Other (See Comments)    Severe upset stomach, nausea, stomach pain,  over heating.   . Tizanidine Nausea And Vomiting and Other (See Comments)    Agitation  . Topamax [Topiramate] Other (See Comments)    Mood change, anxiety, headaches  . Toradol [Ketorolac Tromethamine] Other (See Comments)    Anxiety.  [name]     Headaches   . Amoxicillin Nausea And Vomiting and Anxiety    Has patient had a PCN reaction causing immediate rash, facial/tongue/throat swelling, SOB or lightheadedness with hypotension:  Has patient had a PCN reaction causing severe rash involving mucus membranes or skin necrosis:  Has patient had a PCN reaction that required hospitalization  Has patient had a PCN reaction occurring within the last 10 years:  If all of the above answers are "NO", then may proceed with Cephalosporin use.   [name] Anxiety, Other (See Comments) and Palpitations  . Diflucan [Fluconazole] Diarrhea and Palpitations    Stomach pain, pancreatitis flare up, severe headache   . Inapsine [Droperidol] Nausea And Vomiting  . Ketamine Anxiety    Agitation, insomnia   . Levofloxacin Palpitations and Other (See Comments)    Light headed, anxiety, blood rushing, shaking, sweating.l   . Lyrica [Pregabalin] Other (See Comments)    Mood changes- become listless, disinterested  . Phenergan [Promethazine Hcl] Anxiety  . Phenothiazines Hives, Nausea And Vomiting and Rash  . Primaxin [Imipenem] Nausea And Vomiting  . Prozac [Fluoxetine Hcl] Anxiety  . Reglan [Metoclopramide Hcl] Anxiety    Immunization History  Administered Date(s) Administered  . Influenza Split 09/11/2010, 11/30/2013,  09/27/2014, 10/08/2018  . Influenza,inj,Quad PF,6+ Mos 09/01/2012, 12/06/2017, 09/29/2018  . Influenza-Unspecified 11/10/1998, 10/01/2006, 10/11/2008, 09/02/2014  . Pneumococcal Polysaccharide-23 12/05/2010  . Tdap 11/30/2004    Past Medical History:  Diagnosis Date  . Asthma   . Bursitis   . Chronic sinusitis   . DDD  (degenerative disc disease), cervical   . DDD (degenerative disc disease), lumbar   . Diabetes mellitus    Type 1   . Fibromyalgia   . Hyperlipidemia   . IBS (irritable bowel syndrome)   . Migraine without aura, with intractable migraine, so stated, without mention of status migrainosus 01/25/2014  . Migraines   . Osteoarthritis of both knees 10/24/2016   moderate  . Pancreatitis chronic   . Pelvic floor dysfunction 10/24/2016  . Sjogren's syndrome ([name]   . Vulvar vestibulitis     Tobacco History: Social History   Tobacco Use  Smoking Status Never Smoker  Smokeless Tobacco Never Used   Counseling given: Not Answered   Outpatient Medications Prior to Visit  Medication Sig Dispense Refill  . albuterol (VENTOLIN HFA) 108 (90 Base) MCG/ACT inhaler Inhale 1-2 puffs into the lungs every 6 (six) hours as needed for wheezing. Asthma 18 g 6  . BAYER CONTOUR NEXT TEST test strip USE TO CHECK BLOOD SUGAR 4X A DAY (B08.9)  5  . Blood Glucose Calibration (ONETOUCH VERIO) SOLN LEVEL 3 (MID) AS DIRECTED USE TO CHECK GLUCOSE METER IN VITRO    . Blood Glucose Monitoring Suppl (ONETOUCH VERIO FLEX SYSTEM) w/Device KIT USE TO CHECK BLOOD SUGARS USE TO CHECK BLOOD SUGARS DX  E08.9 FINGERSTICK    . calcium carbonate (TUMS EX) 750 MG chewable tablet Chew 2-4 tablets by mouth 2 (two) times daily as needed for heartburn. No more than 10 a day    . carboxymethylcellulose (REFRESH PLUS) 0.5 % SOLN 1-2 drops every 2 (two) hours as needed for Dry eyes    . diclofenac sodium (VOLTAREN) 1 % GEL APPLY 3 GRAMS TO 3 LARGE JOINTS UP TO 3 TIMES DAILY AS NEEDED 300 g 3  . Docusate Sodium (DSS) 100 MG CAPS Take 1 capsule by mouth as needed.    Marland Kitchen HYDROcodone-acetaminophen (NORCO/VICODIN) 5-325 MG per tablet Take 1-2 tablets by mouth every 6 (six) hours as needed for moderate pain.     Marland Kitchen ibuprofen (ADVIL,MOTRIN) 200 MG tablet Take 400-800 mg by mouth every 8 (eight) hours as needed for moderate pain.    . Insulin  Glargine (BASAGLAR KWIKPEN) 100 UNIT/ML SOPN Inject 22 Units into the skin 2 (two) times daily. 26 units am, 22 units pm    . insulin lispro (INSULIN LISPRO) 100 UNIT/ML KwikPen Junior Inject into the skin.    . Magnesium 300 MG CAPS Take 300 mg by mouth every evening.     Marland Kitchen MICROLET LANCETS MISC AS DIRECTED FOUR TIMES A DAY 30 DAYS  2  . mometasone-formoterol (DULERA) 100-5 MCG/ACT AERO Inhale 2 puffs into the lungs in the morning and at bedtime. 13 g 5  . Multiple Vitamins-Minerals (ALIVE WOMENS GUMMY PO) Take 2 tablets by mouth daily after breakfast.     . omeprazole (PRILOSEC) 20 MG capsule Take 20 mg by mouth every morning.     Marland Kitchen OVER THE COUNTER MEDICATION daily.    . polyethylene glycol (MIRALAX / GLYCOLAX) packet Take 17 g by mouth daily as needed for mild constipation.    . Probiotic Product (PROBIOTIC DAILY PO) Take 1 capsule by mouth daily. Reported on 01/16/2016    .  rizatriptan (MAXALT) 10 MG tablet Take 10 mg by mouth as needed for migraine. May repeat in 2 hours if needed for headaches    . ZENPEP 25000 units CPEP Take 1-3 capsules by mouth 3 (three) times daily with meals as needed. 3-4 caps per meal and 1-2 caps per snack  11  . budesonide-formoterol (SYMBICORT) 80-4.5 MCG/ACT inhaler Inhale 2 puffs into the lungs in the morning and at bedtime. (Patient not taking: Reported on 12/13/2020) 10.2 g 12  . benzonatate (TESSALON) 100 MG capsule Take 1 capsule (100 mg total) by mouth every 8 (eight) hours. (Patient not taking: Reported on 12/13/2020) 21 capsule 0  . dexamethasone (DECADRON) 6 MG tablet Take 1 tablet (6 mg total) by mouth 2 (two) times daily with a meal. (Patient not taking: Reported on 12/13/2020) 6 tablet 0   No facility-administered medications prior to visit.     Review of Systems:   Constitutional:   No  weight loss, night sweats,  Fevers, chills,  +fatigue, or  lassitude.  HEENT:   No headaches,  Difficulty swallowing,  Tooth/dental problems, or  Sore throat,                 No sneezing, itching, ear ache, nasal congestion, post nasal drip,   CV:  No chest pain,  Orthopnea, PND, swelling in lower extremities, anasarca, dizziness, palpitations, syncope.   GI  No heartburn, indigestion, abdominal pain, nausea, vomiting, diarrhea, change in bowel habits, loss of appetite, bloody stools.   Resp:   No chest wall deformity  Skin: no rash or lesions.  GU: no dysuria, change in color of urine, no urgency or frequency.  No flank pain, no hematuria   MS:  No joint pain or swelling.  No decreased range of motion.  No back pain.    Physical Exam  BP 122/70 (BP Location: Left Arm, Patient Position: Sitting, Cuff Size: Normal)   Pulse 98   Temp (!) 97.5 F (36.4 C) (Temporal)   Ht _0  (1.626 m)   Wt 156 lb 12.8 oz (71.1 kg)   LMP 04/03/2020   SpO2 97%   BMI 26.91 kg/m   GEN: A/Ox3; pleasant , NAD, well nourished    HEENT:  [place]/AT,    NOSE-clear, THROAT-clear, no lesions, no postnasal drip or exudate noted.   NECK:  Supple w/ fair ROM; no JVD; normal carotid impulses w/o bruits; no thyromegaly or nodules palpated; no lymphadenopathy.    RESP  Clear  P & A; w/o, wheezes/ rales/ or rhonchi. no accessory muscle use, no dullness to percussion  CARD:  RRR, no m/r/g, no peripheral edema, pulses intact, no cyanosis or clubbing.  GI:   Soft & nt; nml bowel sounds; no organomegaly or masses detected.   Musco: Warm bil, no deformities or joint swelling noted.   Neuro: alert, no focal deficits noted.    Skin: Warm, no lesions or rashes    Lab Results:  CBC  BNP No results found for: BNP  ProBNP No results found for: PROBNP  Imaging: No results found.    PFT Results Latest Ref Rng & Units 11/19/2016  FVC-Pre L 3.45  FVC-Predicted Pre % 90  FVC-Post L 3.62  FVC-Predicted Post % 95  Pre FEV1/FVC % % 71  Post FEV1/FCV % % 77  FEV1-Pre L 2.47  FEV1-Predicted Pre % 79  FEV1-Post L 2.81  DLCO uncorrected ml/min/mmHg 18.56  DLCO UNC%  % 72  DLCO corrected ml/min/mmHg 19.31  DLCO COR %  Predicted % 75  DLVA Predicted % 79  TLC L 5.15  TLC % Predicted % 98  RV % Predicted % 80    Lab Results  Component Value Date   NITRICOXIDE 20 01/16/2016        Assessment & Plan:   Asthma Recent asthma exacerbation now resolved. Patient is returning back to baseline  Plan  Patient Instructions  Continue on Dulera 2 puffs Twice daily  , rinse after use. (change to Symbicort 80 2 puffs Twice daily  - when done with [facility] )  Albuterol inhaler 2 puffs every 6hrs as needed for wheezing , this is your rescue inhaler.  Activity as tolerated .  Follow up with Dr. Lamonte Sakai or Parrett in 3 months and As needed           Rexene Edison, NP 12/13/2020

## 2020-12-13 NOTE — Patient Instructions (Addendum)
Continue on Dulera 2 puffs Twice daily  , rinse after use. (change to Symbicort 80 2 puffs Twice daily  - when done with [facility] )  Albuterol inhaler 2 puffs every 6hrs as needed for wheezing , this is your rescue inhaler.  Activity as tolerated .  Follow up with Dr. Delton Coombes or Tashauna Caisse in 3 months and As needed

## 2020-12-22 NOTE — Telephone Encounter (Signed)
Of course that is fine you can send to her pharmacy of choice.

## 2020-12-22 NOTE — Telephone Encounter (Signed)
Patient sent email   Good morning. Hope you are doing well this week. I was wondering if you could put in a refill for the [facility]? I would really appreciate it! I know they have stopped making it, but if one of the [facility] still has any left in stock, I could save it for this summer, when my asthma symptoms are much worse. It definitely works the best! [facility] [place] to see if they have any more. That is where I got my last two refills and they had several the last time I checked. Fingers crossed! Thank you, [name]  Tammy please advise.

## 2020-12-23 MED ORDER — MOMETASONE FURO-FORMOTEROL FUM 100-5 MCG/ACT IN AERO
2.0000 | INHALATION_SPRAY | Freq: Two times a day (BID) | RESPIRATORY_TRACT | 5 refills | Status: DC
Start: 2020-12-23 — End: 2021-02-27

## 2021-01-06 NOTE — Telephone Encounter (Signed)
Please advise on patient mychart message  Good morning, Ms. Heather Wagner. Hope you are having a good week. Unfortunately, I was exposed to Covid last Sunday. I had a fever, headache, stomach issues on Tuesday, had some chest pain on Wednesday and now am starting to have chest congestion. Waiting on the results of a Covid test, but it could be a couple more days. I am concerned that, if I am positive, I may begin having difficulty breathing again, especially over the weekend. Do you think it would be a good idea to have a prescription for the steroids I took last time [facility] to [facility] just in case? That way, if I am positive and start having asthma issues, I can begin taking them and not have to wait until it gets much worse? Please let me know what you think. Thank you, [name]

## 2021-01-06 NOTE — Telephone Encounter (Signed)
Sorry to hear that you have been exposed to Covid. Definitely let us know if you are positive.  There are some outpatient treatment options for COVID-19.  Please verify her Covid vaccine status. For now would not recommend empiric steroids.  Continue to treat any symptoms  Along with supportive care .   Please contact office for sooner follow up if symptoms do not improve or worsen or seek emergency care

## 2021-01-09 NOTE — Telephone Encounter (Signed)
I agree thanks!

## 2021-01-09 NOTE — Telephone Encounter (Signed)
Sorry to hear that she tested positive.  If today is day 7 and you are having minimal symptoms that are improving.  No need for additional outpatient therapy.  I am glad you are doing well.  Continue with supportive care.  Call us if you need anything.  And continue to have your routine follow-ups as planned and recommended  Please contact office for sooner follow up if symptoms do not improve or worsen or seek emergency care

## 2021-01-09 NOTE — Telephone Encounter (Signed)
Please see mychart message from patient  Heather Wagner, Heather Wagner. Hope you had a Heather weekend.  Unfortunately, I tested positive for Covid. Got my results Friday evening. This is day seven since the symptoms started; they come and go and have been fairly mild so far. I am not having trouble breathing at this point- it seems to be more cold/flu-like and intestinal this time. I will let you know if I get worse or start having any respiratory issues. Thank you, [name]

## 2021-02-25 ENCOUNTER — Other Ambulatory Visit: Payer: Self-pay | Admitting: Adult Health

## 2021-03-10 NOTE — Progress Notes (Deleted)
Office Visit Note  Patient: Heather Wagner             Date of Birth: [birth year]           MRN: [id number]             PCP: Maurice Small, MD Referring: Maurice Small, MD Visit Date: 03/23/2021 Occupation: @GUAROCC @  Subjective:  No chief complaint on file.   History of Present Illness: Heather Wagner is a 45 y.o. female ***   Activities of Daily Living:  Patient reports morning stiffness for *** {minute/hour:19697}.   Patient {ACTIONS;DENIES/REPORTS:21021675::"Denies"} nocturnal pain.  Difficulty dressing/grooming: {ACTIONS;DENIES/REPORTS:21021675::"Denies"} Difficulty climbing stairs: {ACTIONS;DENIES/REPORTS:21021675::"Denies"} Difficulty getting out of chair: {ACTIONS;DENIES/REPORTS:21021675::"Denies"} Difficulty using hands for taps, buttons, cutlery, and/or writing: {ACTIONS;DENIES/REPORTS:21021675::"Denies"}  No Rheumatology ROS completed.   PMFS History:  Patient Active Problem List   Diagnosis Date Noted  . Intractable pain   . Acute on chronic pancreatitis (HCC) 10/28/2017  . DDD (degenerative disc disease), cervical 07/12/2017  . DDD (degenerative disc disease), lumbar 07/12/2017  . Primary insomnia 05/30/2017  . Other fatigue 05/30/2017  . ANA positive 04/19/2017  . History of chronic sinusitis 04/19/2017  . Fibromyalgia 10/24/2016  . Osteoarthritis of both knees 10/24/2016  . Sjogren's syndrome (HCC) 10/24/2016  . IBS (irritable bowel syndrome) 10/24/2016  . Migraines 10/24/2016  . Chronic sinusitis 10/24/2016  . Pelvic floor dysfunction 10/24/2016  . Vulvar vestibulitis 10/24/2016  . Dehydration 07/11/2016  . Fatty liver 07/11/2016  . Elevated LFTs 07/11/2016  . 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 Abdominal pain 01/05/2016  . Pancreatitis, acute   . CAP (community acquired pneumonia) 06/19/2014  . Pain management 06/18/2014  . Chronic pancreatitis (HCC) 06/18/2014  . Intractable migraine without aura 01/25/2014  . Acute  pancreatitis 09/25/2013  . Leucocytosis 09/25/2013  . Microcytic anemia 09/25/2013  . Asthma 09/25/2013  . Type 2 diabetes mellitus without complication, with long-term current use of insulin (HCC) 09/25/2013  . Hyponatremia 09/25/2013    Past Medical History:  Diagnosis Date  . Asthma   . Bursitis   . Chronic sinusitis   . DDD (degenerative disc disease), cervical   . DDD (degenerative disc disease), lumbar   . Diabetes mellitus    Type 1   . Fibromyalgia   . Hyperlipidemia   . IBS (irritable bowel syndrome)   . Migraine without aura, with intractable migraine, so stated, without mention of status migrainosus 01/25/2014  . Migraines   . Osteoarthritis of both knees 10/24/2016   moderate  . Pancreatitis chronic   . Pelvic floor dysfunction 10/24/2016  . Sjogren's syndrome (HCC)   . Vulvar vestibulitis     Family History  Problem Relation Age of Onset  . Asthma Mother   . Pancreatic cancer Mother   . High Cholesterol Father   . Macular degeneration Father   . Migraines Sister   . Endometriosis Sister   . Asthma Brother   . GER disease Brother   . Pancreatitis Paternal Grandfather   . Pancreatic cancer Other   . Pancreatitis Maternal Uncle    Past Surgical History:  Procedure Laterality Date  . ABDOMINAL HYSTERECTOMY  05/27/2020   [facility] - per patient  . BREAST REDUCTION SURGERY  2010  . DILATION AND CURETTAGE OF UTERUS  2007  . ERCP     Removed stone from Bile duct  . ERCP     with MRI  . PANCREAS SURGERY  1997  . whiple  1997  . whipple's  Social History   Social History Narrative  . Not on file   Immunization History  Administered Date(s) Administered  . Influenza Split 09/11/2010, 11/30/2013, 09/27/2014, 10/08/2018  . Influenza,inj,Quad PF,6+ Mos 09/01/2012, 12/06/2017, 09/29/2018  . Influenza-Unspecified 11/10/1998, 10/01/2006, 10/11/2008, 09/02/2014  . Pneumococcal Polysaccharide-23 12/05/2010  . Tdap 11/30/2004     Objective: Vital Signs: LMP  04/03/2020    Physical Exam   Musculoskeletal Exam: ***  CDAI Exam: CDAI Score: -- Patient Global: --; Provider Global: -- Swollen: --; Tender: -- Joint Exam 03/23/2021   No joint exam has been documented for this visit   There is currently no information documented on the homunculus. Go to the Rheumatology activity and complete the homunculus joint exam.  Investigation: No additional findings.  Imaging: No results found.  Recent Labs: Lab Results  Component Value Date   WBC 14.6 (H) 04/08/2020   HGB 10.0 (L) 04/08/2020   PLT 513 (H) 04/08/2020   NA 134 (L) 04/08/2020   K 3.2 (L) 04/08/2020   CL 98 04/08/2020   CO2 25 04/08/2020   GLUCOSE 130 (H) 04/08/2020   BUN 13 04/08/2020   CREATININE 0.61 04/08/2020   BILITOT 0.3 04/08/2020   ALKPHOS 74 04/08/2020   AST 17 04/08/2020   ALT 17 04/08/2020   PROT 7.8 04/08/2020   ALBUMIN 3.9 04/08/2020   CALCIUM 8.9 04/08/2020   GFRAA >60 04/08/2020    Speciality Comments: No specialty comments available.  Procedures:  No procedures performed Allergies: Ciprofloxacin hcl, Nortriptyline hcl, Amitriptyline, Compazine [prochlorperazine maleate], Cymbalta [duloxetine hcl], Dexilant [dexlansoprazole], Doxycycline, Flagyl [metronidazole hcl], Gabapentin, Penicillins, Penicillins cross reactors, Septra [sulfamethoxazole-trimethoprim], Tizanidine, Topamax [topiramate], Toradol [ketorolac tromethamine], Zofran [ondansetron hcl], Amoxicillin, Breo ellipta [fluticasone furoate-vilanterol], Diflucan [fluconazole], Inapsine [droperidol], Ketamine, Levofloxacin, Lyrica [pregabalin], Phenergan [promethazine hcl], Phenothiazines, Primaxin [imipenem], Prozac [fluoxetine hcl], and Reglan [metoclopramide hcl]   Assessment / Plan:     Visit Diagnoses: No diagnosis found.  Orders: No orders of the defined types were placed in this encounter.  No orders of the defined types were placed in this encounter.   Face-to-face time spent with patient  was *** minutes. Greater than 50% of time was spent in counseling and coordination of care.  Follow-Up Instructions: No follow-ups on file.   Ellen Henri, CMA  Note - This record has been created using Animal nutritionist.  Chart creation errors have been sought, but may not always  have been located. Such creation errors do not reflect on  the standard of medical care.

## 2021-03-23 ENCOUNTER — Ambulatory Visit: Payer: Managed Care, Other (non HMO) | Admitting: Rheumatology

## 2021-03-23 DIAGNOSIS — M170 Bilateral primary osteoarthritis of knee: Secondary | ICD-10-CM

## 2021-03-23 DIAGNOSIS — M5030 Other cervical disc degeneration, unspecified cervical region: Secondary | ICD-10-CM

## 2021-03-23 DIAGNOSIS — R5383 Other fatigue: Secondary | ICD-10-CM

## 2021-03-23 DIAGNOSIS — F5101 Primary insomnia: Secondary | ICD-10-CM

## 2021-03-23 DIAGNOSIS — Z8719 Personal history of other diseases of the digestive system: Secondary | ICD-10-CM

## 2021-03-23 DIAGNOSIS — M7062 Trochanteric bursitis, left hip: Secondary | ICD-10-CM

## 2021-03-23 DIAGNOSIS — Z8639 Personal history of other endocrine, nutritional and metabolic disease: Secondary | ICD-10-CM

## 2021-03-23 DIAGNOSIS — K760 Fatty (change of) liver, not elsewhere classified: Secondary | ICD-10-CM

## 2021-03-23 DIAGNOSIS — Z8669 Personal history of other diseases of the nervous system and sense organs: Secondary | ICD-10-CM

## 2021-03-23 DIAGNOSIS — M5136 Other intervertebral disc degeneration, lumbar region: Secondary | ICD-10-CM

## 2021-03-23 DIAGNOSIS — M6289 Other specified disorders of muscle: Secondary | ICD-10-CM

## 2021-03-23 DIAGNOSIS — M3509 Sicca syndrome with other organ involvement: Secondary | ICD-10-CM

## 2021-03-23 DIAGNOSIS — M797 Fibromyalgia: Secondary | ICD-10-CM

## 2021-03-24 NOTE — Progress Notes (Deleted)
Office Visit Note  Patient: Heather Wagner             Date of Birth: 13-May-1976           MRN: [id number]             PCP: Maurice Small, MD Referring: Maurice Small, MD Visit Date: 04/03/2021 Occupation: @GUAROCC @  Subjective:  No chief complaint on file.   History of Present Illness: Heather Wagner is a 45 y.o. female ***   Activities of Daily Living:  Patient reports morning stiffness for *** {minute/hour:19697}.   Patient {ACTIONS;DENIES/REPORTS:21021675::"Denies"} nocturnal pain.  Difficulty dressing/grooming: {ACTIONS;DENIES/REPORTS:21021675::"Denies"} Difficulty climbing stairs: {ACTIONS;DENIES/REPORTS:21021675::"Denies"} Difficulty getting out of chair: {ACTIONS;DENIES/REPORTS:21021675::"Denies"} Difficulty using hands for taps, buttons, cutlery, and/or writing: {ACTIONS;DENIES/REPORTS:21021675::"Denies"}  No Rheumatology ROS completed.   PMFS History:  Patient Active Problem List   Diagnosis Date Noted  . Intractable pain   . Acute on chronic pancreatitis (HCC) 10/28/2017  . DDD (degenerative disc disease), cervical 07/12/2017  . DDD (degenerative disc disease), lumbar 07/12/2017  . Primary insomnia 05/30/2017  . Other fatigue 05/30/2017  . ANA positive 04/19/2017  . History of chronic sinusitis 04/19/2017  . Fibromyalgia 10/24/2016  . Osteoarthritis of both knees 10/24/2016  . Sjogren's syndrome (HCC) 10/24/2016  . IBS (irritable bowel syndrome) 10/24/2016  . Migraines 10/24/2016  . Chronic sinusitis 10/24/2016  . Pelvic floor dysfunction 10/24/2016  . Vulvar vestibulitis 10/24/2016  . Dehydration 07/11/2016  . Fatty liver 07/11/2016  . Elevated LFTs 07/11/2016  . 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 Abdominal pain 01/05/2016  . Pancreatitis, acute   . CAP (community acquired pneumonia) 06/19/2014  . Pain management 06/18/2014  . Chronic pancreatitis (HCC) 06/18/2014  . Intractable migraine without aura 01/25/2014  . Acute  pancreatitis 09/25/2013  . Leucocytosis 09/25/2013  . Microcytic anemia 09/25/2013  . Asthma 09/25/2013  . Type 2 diabetes mellitus without complication, with long-term current use of insulin (HCC) 09/25/2013  . Hyponatremia 09/25/2013    Past Medical History:  Diagnosis Date  . Asthma   . Bursitis   . Chronic sinusitis   . DDD (degenerative disc disease), cervical   . DDD (degenerative disc disease), lumbar   . Diabetes mellitus    Type 1   . Fibromyalgia   . Hyperlipidemia   . IBS (irritable bowel syndrome)   . Migraine without aura, with intractable migraine, so stated, without mention of status migrainosus 01/25/2014  . Migraines   . Osteoarthritis of both knees 10/24/2016   moderate  . Pancreatitis chronic   . Pelvic floor dysfunction 10/24/2016  . Sjogren's syndrome (HCC)   . Vulvar vestibulitis     Family History  Problem Relation Age of Onset  . Asthma Mother   . Pancreatic cancer Mother   . High Cholesterol Father   . Macular degeneration Father   . Migraines Sister   . Endometriosis Sister   . Asthma Brother   . GER disease Brother   . Pancreatitis Paternal Grandfather   . Pancreatic cancer Other   . Pancreatitis Maternal Uncle    Past Surgical History:  Procedure Laterality Date  . ABDOMINAL HYSTERECTOMY  05/27/2020   [facility] - per patient  . BREAST REDUCTION SURGERY  2010  . DILATION AND CURETTAGE OF UTERUS  2007  . ERCP     Removed stone from Bile duct  . ERCP     with MRI  . PANCREAS SURGERY  1997  . whiple  1997  . whipple's  Social History   Social History Narrative  . Not on file   Immunization History  Administered Date(s) Administered  . Influenza Split 09/11/2010, 11/30/2013, 09/27/2014, 10/08/2018  . Influenza,inj,Quad PF,6+ Mos 09/01/2012, 12/06/2017, 09/29/2018  . Influenza-Unspecified 11/10/1998, 10/01/2006, 10/11/2008, 09/02/2014  . Pneumococcal Polysaccharide-23 12/05/2010  . Tdap 11/30/2004     Objective: Vital Signs: LMP  04/03/2020    Physical Exam   Musculoskeletal Exam: ***  CDAI Exam: CDAI Score: -- Patient Global: --; Provider Global: -- Swollen: --; Tender: -- Joint Exam 04/03/2021   No joint exam has been documented for this visit   There is currently no information documented on the homunculus. Go to the Rheumatology activity and complete the homunculus joint exam.  Investigation: No additional findings.  Imaging: No results found.  Recent Labs: Lab Results  Component Value Date   WBC 14.6 (H) 04/08/2020   HGB 10.0 (L) 04/08/2020   PLT 513 (H) 04/08/2020   NA 134 (L) 04/08/2020   K 3.2 (L) 04/08/2020   CL 98 04/08/2020   CO2 25 04/08/2020   GLUCOSE 130 (H) 04/08/2020   BUN 13 04/08/2020   CREATININE 0.61 04/08/2020   BILITOT 0.3 04/08/2020   ALKPHOS 74 04/08/2020   AST 17 04/08/2020   ALT 17 04/08/2020   PROT 7.8 04/08/2020   ALBUMIN 3.9 04/08/2020   CALCIUM 8.9 04/08/2020   GFRAA >60 04/08/2020    Speciality Comments: No specialty comments available.  Procedures:  No procedures performed Allergies: Ciprofloxacin hcl, Nortriptyline hcl, Amitriptyline, Compazine [prochlorperazine maleate], Cymbalta [duloxetine hcl], Dexilant [dexlansoprazole], Doxycycline, Flagyl [metronidazole hcl], Gabapentin, Penicillins, Penicillins cross reactors, Septra [sulfamethoxazole-trimethoprim], Tizanidine, Topamax [topiramate], Toradol [ketorolac tromethamine], Zofran [ondansetron hcl], Amoxicillin, Breo ellipta [fluticasone furoate-vilanterol], Diflucan [fluconazole], Inapsine [droperidol], Ketamine, Levofloxacin, Lyrica [pregabalin], Phenergan [promethazine hcl], Phenothiazines, Primaxin [imipenem], Prozac [fluoxetine hcl], and Reglan [metoclopramide hcl]   Assessment / Plan:     Visit Diagnoses: No diagnosis found.  Orders: No orders of the defined types were placed in this encounter.  No orders of the defined types were placed in this encounter.   Face-to-face time spent with patient  was *** minutes. Greater than 50% of time was spent in counseling and coordination of care.  Follow-Up Instructions: No follow-ups on file.   Ellen Henri, CMA  Note - This record has been created using Animal nutritionist.  Chart creation errors have been sought, but may not always  have been located. Such creation errors do not reflect on  the standard of medical care.

## 2021-03-30 NOTE — Progress Notes (Deleted)
Office Visit Note  Patient: Heather Wagner             Date of Birth: 1976-01-30           MRN: [id number]             PCP: Maurice Small, MD Referring: Maurice Small, MD Visit Date: 04/11/2021 Occupation: @GUAROCC @  Subjective:  No chief complaint on file.   History of Present Illness: Heather Wagner is a 45 y.o. female ***   Activities of Daily Living:  Patient reports morning stiffness for *** {minute/hour:19697}.   Patient {ACTIONS;DENIES/REPORTS:21021675::"Denies"} nocturnal pain.  Difficulty dressing/grooming: {ACTIONS;DENIES/REPORTS:21021675::"Denies"} Difficulty climbing stairs: {ACTIONS;DENIES/REPORTS:21021675::"Denies"} Difficulty getting out of chair: {ACTIONS;DENIES/REPORTS:21021675::"Denies"} Difficulty using hands for taps, buttons, cutlery, and/or writing: {ACTIONS;DENIES/REPORTS:21021675::"Denies"}  No Rheumatology ROS completed.   PMFS History:  Patient Active Problem List   Diagnosis Date Noted  . Intractable pain   . Acute on chronic pancreatitis (HCC) 10/28/2017  . DDD (degenerative disc disease), cervical 07/12/2017  . DDD (degenerative disc disease), lumbar 07/12/2017  . Primary insomnia 05/30/2017  . Other fatigue 05/30/2017  . ANA positive 04/19/2017  . History of chronic sinusitis 04/19/2017  . Fibromyalgia 10/24/2016  . Osteoarthritis of both knees 10/24/2016  . Sjogren's syndrome (HCC) 10/24/2016  . IBS (irritable bowel syndrome) 10/24/2016  . Migraines 10/24/2016  . Chronic sinusitis 10/24/2016  . Pelvic floor dysfunction 10/24/2016  . Vulvar vestibulitis 10/24/2016  . Dehydration 07/11/2016  . Fatty liver 07/11/2016  . Elevated LFTs 07/11/2016  . 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 Abdominal pain 01/05/2016  . Pancreatitis, acute   . CAP (community acquired pneumonia) 06/19/2014  . Pain management 06/18/2014  . Chronic pancreatitis (HCC) 06/18/2014  . Intractable migraine without aura 01/25/2014  . Acute  pancreatitis 09/25/2013  . Leucocytosis 09/25/2013  . Microcytic anemia 09/25/2013  . Asthma 09/25/2013  . Type 2 diabetes mellitus without complication, with long-term current use of insulin (HCC) 09/25/2013  . Hyponatremia 09/25/2013    Past Medical History:  Diagnosis Date  . Asthma   . Bursitis   . Chronic sinusitis   . DDD (degenerative disc disease), cervical   . DDD (degenerative disc disease), lumbar   . Diabetes mellitus    Type 1   . Fibromyalgia   . Hyperlipidemia   . IBS (irritable bowel syndrome)   . Migraine without aura, with intractable migraine, so stated, without mention of status migrainosus 01/25/2014  . Migraines   . Osteoarthritis of both knees 10/24/2016   moderate  . Pancreatitis chronic   . Pelvic floor dysfunction 10/24/2016  . Sjogren's syndrome (HCC)   . Vulvar vestibulitis     Family History  Problem Relation Age of Onset  . Asthma Mother   . Pancreatic cancer Mother   . High Cholesterol Father   . Macular degeneration Father   . Migraines Sister   . Endometriosis Sister   . Asthma Brother   . GER disease Brother   . Pancreatitis Paternal Grandfather   . Pancreatic cancer Other   . Pancreatitis Maternal Uncle    Past Surgical History:  Procedure Laterality Date  . ABDOMINAL HYSTERECTOMY  05/27/2020   [facility] - per patient  . BREAST REDUCTION SURGERY  2010  . DILATION AND CURETTAGE OF UTERUS  2007  . ERCP     Removed stone from Bile duct  . ERCP     with MRI  . PANCREAS SURGERY  1997  . whiple  1997  . whipple's  Social History   Social History Narrative  . Not on file   Immunization History  Administered Date(s) Administered  . Influenza Split 09/11/2010, 11/30/2013, 09/27/2014, 10/08/2018  . Influenza,inj,Quad PF,6+ Mos 09/01/2012, 12/06/2017, 09/29/2018  . Influenza-Unspecified 11/10/1998, 10/01/2006, 10/11/2008, 09/02/2014  . Pneumococcal Polysaccharide-23 12/05/2010  . Tdap 11/30/2004     Objective: Vital Signs: LMP  04/03/2020    Physical Exam   Musculoskeletal Exam: ***  CDAI Exam: CDAI Score: -- Patient Global: --; Provider Global: -- Swollen: --; Tender: -- Joint Exam 04/11/2021   No joint exam has been documented for this visit   There is currently no information documented on the homunculus. Go to the Rheumatology activity and complete the homunculus joint exam.  Investigation: No additional findings.  Imaging: No results found.  Recent Labs: Lab Results  Component Value Date   WBC 14.6 (H) 04/08/2020   HGB 10.0 (L) 04/08/2020   PLT 513 (H) 04/08/2020   NA 134 (L) 04/08/2020   K 3.2 (L) 04/08/2020   CL 98 04/08/2020   CO2 25 04/08/2020   GLUCOSE 130 (H) 04/08/2020   BUN 13 04/08/2020   CREATININE 0.61 04/08/2020   BILITOT 0.3 04/08/2020   ALKPHOS 74 04/08/2020   AST 17 04/08/2020   ALT 17 04/08/2020   PROT 7.8 04/08/2020   ALBUMIN 3.9 04/08/2020   CALCIUM 8.9 04/08/2020   GFRAA >60 04/08/2020    Speciality Comments: No specialty comments available.  Procedures:  No procedures performed Allergies: Ciprofloxacin hcl, Nortriptyline hcl, Amitriptyline, Compazine [prochlorperazine maleate], Cymbalta [duloxetine hcl], Dexilant [dexlansoprazole], Doxycycline, Flagyl [metronidazole hcl], Gabapentin, Penicillins, Penicillins cross reactors, Septra [sulfamethoxazole-trimethoprim], Tizanidine, Topamax [topiramate], Toradol [ketorolac tromethamine], Zofran [ondansetron hcl], Amoxicillin, Breo ellipta [fluticasone furoate-vilanterol], Diflucan [fluconazole], Inapsine [droperidol], Ketamine, Levofloxacin, Lyrica [pregabalin], Phenergan [promethazine hcl], Phenothiazines, Primaxin [imipenem], Prozac [fluoxetine hcl], and Reglan [metoclopramide hcl]   Assessment / Plan:     Visit Diagnoses: No diagnosis found.  Orders: No orders of the defined types were placed in this encounter.  No orders of the defined types were placed in this encounter.   Face-to-face time spent with patient  was *** minutes. Greater than 50% of time was spent in counseling and coordination of care.  Follow-Up Instructions: No follow-ups on file.   Ellen Henri, CMA  Note - This record has been created using Animal nutritionist.  Chart creation errors have been sought, but may not always  have been located. Such creation errors do not reflect on  the standard of medical care.

## 2021-04-03 ENCOUNTER — Ambulatory Visit: Payer: Self-pay | Admitting: Rheumatology

## 2021-04-03 DIAGNOSIS — M5136 Other intervertebral disc degeneration, lumbar region: Secondary | ICD-10-CM

## 2021-04-03 DIAGNOSIS — Z8639 Personal history of other endocrine, nutritional and metabolic disease: Secondary | ICD-10-CM

## 2021-04-03 DIAGNOSIS — K760 Fatty (change of) liver, not elsewhere classified: Secondary | ICD-10-CM

## 2021-04-03 DIAGNOSIS — M7062 Trochanteric bursitis, left hip: Secondary | ICD-10-CM

## 2021-04-03 DIAGNOSIS — R5383 Other fatigue: Secondary | ICD-10-CM

## 2021-04-03 DIAGNOSIS — F5101 Primary insomnia: Secondary | ICD-10-CM

## 2021-04-03 DIAGNOSIS — M6289 Other specified disorders of muscle: Secondary | ICD-10-CM

## 2021-04-03 DIAGNOSIS — M170 Bilateral primary osteoarthritis of knee: Secondary | ICD-10-CM

## 2021-04-03 DIAGNOSIS — M797 Fibromyalgia: Secondary | ICD-10-CM

## 2021-04-03 DIAGNOSIS — Z8669 Personal history of other diseases of the nervous system and sense organs: Secondary | ICD-10-CM

## 2021-04-03 DIAGNOSIS — M5030 Other cervical disc degeneration, unspecified cervical region: Secondary | ICD-10-CM

## 2021-04-03 DIAGNOSIS — M3509 Sicca syndrome with other organ involvement: Secondary | ICD-10-CM

## 2021-04-03 DIAGNOSIS — Z8719 Personal history of other diseases of the digestive system: Secondary | ICD-10-CM

## 2021-04-11 ENCOUNTER — Ambulatory Visit: Payer: Self-pay | Admitting: Physician Assistant

## 2021-04-11 DIAGNOSIS — M3509 Sicca syndrome with other organ involvement: Secondary | ICD-10-CM

## 2021-04-11 DIAGNOSIS — Z8639 Personal history of other endocrine, nutritional and metabolic disease: Secondary | ICD-10-CM

## 2021-04-11 DIAGNOSIS — M797 Fibromyalgia: Secondary | ICD-10-CM

## 2021-04-11 DIAGNOSIS — M170 Bilateral primary osteoarthritis of knee: Secondary | ICD-10-CM

## 2021-04-11 DIAGNOSIS — R5383 Other fatigue: Secondary | ICD-10-CM

## 2021-04-11 DIAGNOSIS — M5136 Other intervertebral disc degeneration, lumbar region: Secondary | ICD-10-CM

## 2021-04-11 DIAGNOSIS — M7062 Trochanteric bursitis, left hip: Secondary | ICD-10-CM

## 2021-04-11 DIAGNOSIS — K760 Fatty (change of) liver, not elsewhere classified: Secondary | ICD-10-CM

## 2021-04-11 DIAGNOSIS — F5101 Primary insomnia: Secondary | ICD-10-CM

## 2021-04-11 DIAGNOSIS — M5030 Other cervical disc degeneration, unspecified cervical region: Secondary | ICD-10-CM

## 2021-04-11 DIAGNOSIS — M6289 Other specified disorders of muscle: Secondary | ICD-10-CM

## 2021-04-11 DIAGNOSIS — Z8719 Personal history of other diseases of the digestive system: Secondary | ICD-10-CM

## 2021-04-11 DIAGNOSIS — Z8669 Personal history of other diseases of the nervous system and sense organs: Secondary | ICD-10-CM

## 2021-04-18 NOTE — Progress Notes (Signed)
Office Visit Note  Patient: Heather Wagner             Date of Birth: 1976-01-04           MRN: [id number]             PCP: Maurice Small, MD Referring: Maurice Small, MD Visit Date: 05/02/2021 Occupation: @GUAROCC @  Subjective:  Thoracic spine pain   History of Present Illness: Heather Wagner is a 45 y.o. female with history of fibromyalgia, osteoarthritis, sjogren's syndrome, and DDD.  Patient present overall her fibromyalgia has been well controlled on the current treatment regimen.  She continues to go to physical therapy twice a week which has been very beneficial at managing her symptoms.  She typically has 2000 ultrasound on Tuesdays and muscle strengthening and stabilization on Fridays.  She has also been doing dry needling as well as cupping.  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  According to the patient she has been sorting jewelry which has required her to strain her neck and upper back.  She has been having increased thoracic spinal pain.  She continues to have trapezius muscle tension and muscle tenderness bilaterally.  She has tried using topical magnesium cream and continues to take oral magnesium daily.  She recently ordered a foam roller to try which was too hard and exacerbated her symptoms.  She ordered a new softer foam roller to try, which will be delivered today.     Activities of Daily Living:  Patient reports morning stiffness for 2-3 hours.   Patient Reports nocturnal pain.  Difficulty dressing/grooming: Denies Difficulty climbing stairs: Denies Difficulty getting out of chair: Denies Difficulty using hands for taps, buttons, cutlery, and/or writing: Denies  Review of Systems  Constitutional: Negative for fatigue.  HENT: Positive for nosebleeds, mouth dryness and nose dryness. Negative for mouth sores.   Eyes: Positive for dryness. Negative for pain and itching.   Respiratory: Positive for shortness of breath. Negative for difficulty breathing.   Cardiovascular: Positive for palpitations. Negative for chest pain.  Gastrointestinal: Positive for constipation. Negative for blood in stool and diarrhea.  Endocrine: Negative for increased urination.  Genitourinary: Negative for difficulty urinating.  Musculoskeletal: Positive for arthralgias, joint pain, joint swelling, myalgias, morning stiffness, muscle tenderness and myalgias.  Skin: Negative for color change, rash and redness.  Allergic/Immunologic: Positive for susceptible to infections.  Neurological: Negative for dizziness, numbness, headaches, memory loss and weakness.  Hematological: Positive for bruising/bleeding tendency.  Psychiatric/Behavioral: Negative for confusion.    PMFS History:  Patient Active Problem List   Diagnosis Date Noted  . Intractable pain   . Acute on chronic pancreatitis (HCC) 10/28/2017  . DDD (degenerative disc disease), cervical 07/12/2017  . DDD (degenerative disc disease), lumbar 07/12/2017  . Primary insomnia 05/30/2017  . Other fatigue 05/30/2017  . ANA positive 04/19/2017  . History of chronic sinusitis 04/19/2017  . Fibromyalgia 10/24/2016  . Osteoarthritis of both knees 10/24/2016  . Sjogren's syndrome (HCC) 10/24/2016  . IBS (irritable bowel syndrome) 10/24/2016  . Migraines 10/24/2016  . Chronic sinusitis 10/24/2016  . Pelvic floor dysfunction 10/24/2016  . Vulvar vestibulitis 10/24/2016  . Dehydration 07/11/2016  . Fatty liver 07/11/2016  . Elevated LFTs 07/11/2016  . 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 Abdominal pain 01/05/2016  . Pancreatitis, acute   . CAP (community acquired pneumonia) 06/19/2014  . Pain management 06/18/2014  . Chronic  pancreatitis (HCC) 06/18/2014  . Intractable migraine without aura 01/25/2014  . Acute pancreatitis 09/25/2013  . Leucocytosis 09/25/2013  . Microcytic anemia 09/25/2013  . Asthma 09/25/2013   . Type 2 diabetes mellitus without complication, with long-term current use of insulin (HCC) 09/25/2013  . Hyponatremia 09/25/2013    Past Medical History:  Diagnosis Date  . Asthma   . Bursitis   . Chronic sinusitis   . DDD (degenerative disc disease), cervical   . DDD (degenerative disc disease), lumbar   . Diabetes mellitus    Type 1   . Fibromyalgia   . Hyperlipidemia   . IBS (irritable bowel syndrome)   . Migraine without aura, with intractable migraine, so stated, without mention of status migrainosus 01/25/2014  . Migraines   . Osteoarthritis of both knees 10/24/2016   moderate  . Pancreatitis chronic   . Pelvic floor dysfunction 10/24/2016  . Sjogren's syndrome (HCC)   . Vulvar vestibulitis     Family History  Problem Relation Age of Onset  . Asthma Mother   . Pancreatic cancer Mother   . High Cholesterol Father   . Macular degeneration Father   . Migraines Sister   . Endometriosis Sister   . Asthma Brother   . GER disease Brother   . Pancreatitis Paternal Grandfather   . Pancreatic cancer Other   . Pancreatitis Maternal Uncle    Past Surgical History:  Procedure Laterality Date  . ABDOMINAL HYSTERECTOMY  05/27/2020   [facility] - per patient  . BREAST REDUCTION SURGERY  2010  . DILATION AND CURETTAGE OF UTERUS  2007  . ERCP     Removed stone from Bile duct  . ERCP     with MRI  . PANCREAS SURGERY  1997  . whiple  1997  . whipple's     Social History   Social History Narrative  . Not on file   Immunization History  Administered Date(s) Administered  . Influenza Split 09/11/2010, 11/30/2013, 09/27/2014, 10/08/2018  . Influenza,inj,Quad PF,6+ Mos 09/01/2012, 12/06/2017, 09/29/2018  . Influenza-Unspecified 11/10/1998, 10/01/2006, 10/11/2008, 09/02/2014  . Pneumococcal Polysaccharide-23 12/05/2010  . Tdap 11/30/2004     Objective: Vital Signs: BP 117/76 (BP Location: Left Arm, Patient Position: Sitting, Cuff Size: Normal)   Pulse 89   Resp 14   Ht  5\' 4"  (1.626 m)   Wt 151 lb 12.8 oz (68.9 kg)   LMP 04/03/2020   BMI 26.06 kg/m    Physical Exam Vitals and nursing note reviewed.  Constitutional:      Appearance: She is well-developed.  HENT:     Head: Normocephalic and atraumatic.  Eyes:     Conjunctiva/sclera: Conjunctivae normal.  Pulmonary:     Effort: Pulmonary effort is normal.  Abdominal:     Palpations: Abdomen is soft.  Musculoskeletal:     Cervical back: Normal range of motion.  Skin:    General: Skin is warm and dry.     Capillary Refill: Capillary refill takes less than 2 seconds.  Neurological:     Mental Status: She is alert and oriented to person, place, and time.  Psychiatric:        Behavior: Behavior normal.      Musculoskeletal Exam: C-spine, thoracic spine, and lumbar spine good ROM.  Midline spinal tenderness in the thoracic region. Trapezius muscle tension and tenderness bilaterally.  Shoulder joints, elbow joints, wrist joints, MCPs, PIPs, and DIPs good ROM with no synovitis.  Complete fist formation bilaterally.  Left hip has slightly  limited ROM with some discomfort.  Right hip has good ROM.  No tenderness over trochanteric bursa at this time.  Knee joints good ROM with no warmth or effusion.  Ankle joints good ROM with no tenderness or joint swelling.   CDAI Exam: CDAI Score: -- Patient Global: --; Provider Global: -- Swollen: --; Tender: -- Joint Exam 05/02/2021   No joint exam has been documented for this visit   There is currently no information documented on the homunculus. Go to the Rheumatology activity and complete the homunculus joint exam.  Investigation: No additional findings.  Imaging: No results found.  Recent Labs: Lab Results  Component Value Date   WBC 14.6 (H) 04/08/2020   HGB 10.0 (L) 04/08/2020   PLT 513 (H) 04/08/2020   NA 134 (L) 04/08/2020   K 3.2 (L) 04/08/2020   CL 98 04/08/2020   CO2 25 04/08/2020   GLUCOSE 130 (H) 04/08/2020   BUN 13 04/08/2020    CREATININE 0.61 04/08/2020   BILITOT 0.3 04/08/2020   ALKPHOS 74 04/08/2020   AST 17 04/08/2020   ALT 17 04/08/2020   PROT 7.8 04/08/2020   ALBUMIN 3.9 04/08/2020   CALCIUM 8.9 04/08/2020   GFRAA >60 04/08/2020    Speciality Comments: No specialty comments available.  Procedures:  No procedures performed Allergies: Ciprofloxacin hcl, Nortriptyline hcl, Amitriptyline, Compazine [prochlorperazine maleate], Cymbalta [duloxetine hcl], Dexilant [dexlansoprazole], Doxycycline, Flagyl [metronidazole hcl], Gabapentin, Penicillins, Penicillins cross reactors, Septra [sulfamethoxazole-trimethoprim], Tizanidine, Topamax [topiramate], Toradol [ketorolac tromethamine], Zofran [ondansetron hcl], Amoxicillin, Breo ellipta [fluticasone furoate-vilanterol], Diflucan [fluconazole], Inapsine [droperidol], Ketamine, Levofloxacin, Lyrica [pregabalin], Phenergan [promethazine hcl], Phenothiazines, Primaxin [imipenem], Prozac [fluoxetine hcl], and Reglan [metoclopramide hcl]   Assessment / Plan:     Visit Diagnoses: Fibromyalgia: She has generalized hyperalgesia and positive tender points on exam.  She continues to experience myalgias and muscle tenderness due to fibromyalgia.  Overall her discomfort has been tolerable.  She has been going to physical therapy twice a week which has been helpful at alleviating her symptoms.  She has tried dry needling, massage, ultrasound, and muscle strengthening which has improved her symptoms.  She has had less frequent episodes of costochondritis.  She presents today with increased thoracic midline spinal pain.  X-rays were obtained today.  We discussed continuing physical therapy as well as performing stretching exercises on a daily basis.  We also discussed using a foam roller which should be delivered to her home today.  She was advised to notify us if her discomfort persists or worsens.  We discussed the importance of regular exercise and good sleep hygiene.  She plans on  continuing to walk 30 minutes on a daily basis for exercise.  She will follow-up in the office in 6 months.  Other fatigue: She continues to have chronic fatigue secondary to insomnia.  She has started to increase her exercise regimen which has been helpful.  Primary insomnia: Chronic and exacerbated by nocturnal pain.   Primary osteoarthritis of both knees: She has good range of motion of both knee joints on exam.  No warmth or effusion was noted.  She experiences occasional discomfort in her right knee.  She uses Voltaren gel topically as needed for pain relief.  She has been trying to walk 30 minutes on a daily basis for exercise.  Sjogren's syndrome with other organ involvement (HCC) - Negative ANA, positive SSB: She continues to have chronic sicca symptoms which are unchanged.  We discussed the use of over-the-counter products.  We also discussed the importance  of following up with her dentist and ophthalmologist on a regular basis.  No signs of inflammatory arthritis were noted on examination today.  No synovitis noted.  DDD (degenerative disc disease), cervical: She has good range of motion of the C-spine on examination today.  She has trapezius muscle tension and muscle tenderness bilaterally.  No symptoms of radiculopathy at this time.  She has been performing stretching exercises on a daily basis.  She is also been getting dry needling and cupping performed at physical therapy on a regular basis.  Pain in thoracic spine -She presents today with increased thoracic spinal pain which started about 1 month ago.  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  She feels that her back pain is exacerbated by the change in posture despite trying to stretch on a daily basis.  She has tried massage and cupping.  She has midline spinal tenderness on exam.  X-rays of the thoracic spine were obtained today.  She  plans to continue physical therapy twice a week as well as performing home exercises daily.  She can continue massage, cupping, and dry needling.  Also discussed the use of a foam roller. Plan: XR Thoracic Spine 2 View  DDD (degenerative disc disease), lumbar: She has no midline spinal tenderness in the lumbar region.  No symptoms of radiculopathy.    Trochanteric bursitis of left hip: She has some limited ROM in the left hip joint with discomfort.  No tenderness to palpation over the left trochanteric bursa at this time.  She has been going to PT twice a week.  Dry needling has helped to alleviate her discomfort.   Other medical conditions are listed as follows:   Fatty liver  History of migraine  Pelvic floor dysfunction  History of diabetes mellitus  History of pancreatitis - Hereditary, followed up at [facility]  History of IBS    Orders: Orders Placed This Encounter  Procedures  . XR Thoracic Spine 2 View   No orders of the defined types were placed in this encounter.    Follow-Up Instructions: Return in about 6 months (around 11/01/2021) for Fibromyalgia, Osteoarthritis, Sjogren's syndrome, DDD.   Gearldine Bienenstock, PA-C  Note - This record has been created using Dragon software.  Chart creation errors have been sought, but may not always  have been located. Such creation errors do not reflect on  the standard of medical care.

## 2021-04-20 ENCOUNTER — Ambulatory Visit: Payer: Self-pay | Admitting: Emergency Medicine

## 2021-04-27 ENCOUNTER — Ambulatory Visit: Payer: Self-pay | Admitting: Physician Assistant

## 2021-05-02 ENCOUNTER — Ambulatory Visit: Payer: Managed Care, Other (non HMO) | Admitting: Physician Assistant

## 2021-05-02 ENCOUNTER — Ambulatory Visit (INDEPENDENT_AMBULATORY_CARE_PROVIDER_SITE_OTHER): Payer: Managed Care, Other (non HMO)

## 2021-05-02 ENCOUNTER — Other Ambulatory Visit: Payer: Self-pay

## 2021-05-02 ENCOUNTER — Encounter: Payer: Self-pay | Admitting: Physician Assistant

## 2021-05-02 VITALS — BP 117/76 | HR 89 | Resp 14 | Ht 64.0 in | Wt 151.8 lb

## 2021-05-02 DIAGNOSIS — M5136 Other intervertebral disc degeneration, lumbar region: Secondary | ICD-10-CM

## 2021-05-02 DIAGNOSIS — M5030 Other cervical disc degeneration, unspecified cervical region: Secondary | ICD-10-CM

## 2021-05-02 DIAGNOSIS — M797 Fibromyalgia: Secondary | ICD-10-CM

## 2021-05-02 DIAGNOSIS — M170 Bilateral primary osteoarthritis of knee: Secondary | ICD-10-CM

## 2021-05-02 DIAGNOSIS — Z8669 Personal history of other diseases of the nervous system and sense organs: Secondary | ICD-10-CM

## 2021-05-02 DIAGNOSIS — K760 Fatty (change of) liver, not elsewhere classified: Secondary | ICD-10-CM

## 2021-05-02 DIAGNOSIS — F5101 Primary insomnia: Secondary | ICD-10-CM | POA: Diagnosis not present

## 2021-05-02 DIAGNOSIS — R5383 Other fatigue: Secondary | ICD-10-CM | POA: Diagnosis not present

## 2021-05-02 DIAGNOSIS — M546 Pain in thoracic spine: Secondary | ICD-10-CM

## 2021-05-02 DIAGNOSIS — M6289 Other specified disorders of muscle: Secondary | ICD-10-CM

## 2021-05-02 DIAGNOSIS — Z8719 Personal history of other diseases of the digestive system: Secondary | ICD-10-CM

## 2021-05-02 DIAGNOSIS — Z8639 Personal history of other endocrine, nutritional and metabolic disease: Secondary | ICD-10-CM

## 2021-05-02 DIAGNOSIS — M3509 Sicca syndrome with other organ involvement: Secondary | ICD-10-CM

## 2021-05-02 DIAGNOSIS — M7062 Trochanteric bursitis, left hip: Secondary | ICD-10-CM

## 2021-05-03 NOTE — Progress Notes (Signed)
Please call the patient to review thoracic spine x-ray results.  She has early degenerative changes.  Recommend continuing physical therapy and home exercises.

## 2021-05-11 ENCOUNTER — Other Ambulatory Visit: Payer: Self-pay

## 2021-05-11 ENCOUNTER — Encounter: Payer: Self-pay | Admitting: Emergency Medicine

## 2021-05-11 ENCOUNTER — Ambulatory Visit: Payer: Managed Care, Other (non HMO) | Admitting: Emergency Medicine

## 2021-05-11 DIAGNOSIS — R918 Other nonspecific abnormal finding of lung field: Secondary | ICD-10-CM | POA: Insufficient documentation

## 2021-05-11 DIAGNOSIS — J4530 Mild persistent asthma, uncomplicated: Secondary | ICD-10-CM | POA: Diagnosis not present

## 2021-05-11 NOTE — Assessment & Plan Note (Signed)
Small pulmonary nodules all less than 3 mm on CT from 10/07/2020.  In a low risk patient no follow-up indicated.

## 2021-05-11 NOTE — Patient Instructions (Signed)
Please continue your Symbicort 2 puffs twice a day.  Rinse and gargle after using. Keep your albuterol available to use 2 puffs when needed for shortness of breath, chest tightness, wheezing. Continue nasal saline rinses as you have been doing them.

## 2021-05-11 NOTE — Assessment & Plan Note (Signed)
Moderate obstruction with a positive bronchodilator response 2017.  She is been doing well recently, tolerated the change from [facility] to Symbicort.  Minimal albuterol use.  Her last exacerbation was in the fall when she had COVID-19.  She has the most trouble in the summer months, July.  She will call if she has any flaring symptoms.  Please continue your Symbicort 2 puffs twice a day.  Rinse and gargle after using. Keep your albuterol available to use 2 puffs when needed for shortness of breath, chest tightness, wheezing. Continue nasal saline rinses as you have been doing them. Follow Dr. Delton Coombes in 6 months or sooner if you have any problems.

## 2021-05-11 NOTE — Progress Notes (Signed)
Subjective:    Patient ID: Heather Wagner, female    DOB: [birth year], 45 y.o.   MRN: [id number]  HPI 45 year old never smoker with a history of diabetes type 1, IBS, migraines, Sjogren's syndrome.  We follow her for asthma and allergic rhinitis.  She had COVID-19 in 11/2020 she has been maintained on ICS/LABA, initially Dulera but then switched to Symbicort. Her control has been good on the Symbicort.  Good functional capacity, less SOB. She is walking daily. Humidity makes her breathing more difficult. Using albuterol rarely, 1-2x a week. No prednisone since she had COVID last Fall. Averages about once a year.   She uses [place] for allergies, not on any antihistamines.   Pulmonary function testing 11/19/2016 reviewed by me, showed moderate obstruction with a positive bronchodilator response, normal lung volumes and a slightly decreased diffusion capacity.  CT scan of the chest 10/07/2020 reviewed by me, shows no infiltrates, a few scattered 2 to 3 mm pulmonary nodules peripherally of unclear significance.   Review of Systems As per HPI  Past Medical History:  Diagnosis Date   Asthma    Bursitis    Chronic sinusitis    DDD (degenerative disc disease), cervical    DDD (degenerative disc disease), lumbar    Diabetes mellitus    Type 1    Fibromyalgia    Hyperlipidemia    IBS (irritable bowel syndrome)    Migraine without aura, with intractable migraine, so stated, without mention of status migrainosus 01/25/2014   Migraines    Osteoarthritis of both knees 10/24/2016   moderate   Pancreatitis chronic    Pelvic floor dysfunction 10/24/2016   Sjogren's syndrome ([place])    Vulvar vestibulitis      Family History  Problem Relation Age of Onset   Asthma Mother    Pancreatic cancer Mother    High Cholesterol Father    Macular degeneration Father    Migraines Sister    Endometriosis Sister    Asthma Brother    GER disease Brother    Pancreatitis Paternal Grandfather     Pancreatic cancer Other    Pancreatitis Maternal Uncle      Social History   Socioeconomic History   Marital status: Married    Spouse name: Not on file   Number of children: 0   Years of education: BA   Highest education level: Not on file  Occupational History   Occupation: Agricultural consultant: UNEMPLOYED  Tobacco Use   Smoking status: Never   Smokeless tobacco: Never  Vaping Use   Vaping Use: Never used  Substance and Sexual Activity   Alcohol use: No    Alcohol/week: 0.0 standard drinks   Drug use: No   Sexual activity: Never    Birth control/protection: Abstinence  Other Topics Concern   Not on file  Social History Narrative   Not on file   Social Determinants of Health   Financial Resource Strain: Not on file  Food Insecurity: Not on file  Transportation Needs: Not on file  Physical Activity: Not on file  Stress: Not on file  Social Connections: Not on file  Intimate Partner Violence: Not on file     Allergies  Allergen Reactions   Ciprofloxacin Hcl Other (See Comments)    Flare up of pancreatitis, severe yeast infection    Nortriptyline Hcl Shortness Of Breath    Swelling anxiety   Amitriptyline Other (See Comments)    sedation   Compazine [Prochlorperazine Maleate] Other (See  Comments)    Neurological - uncontrollable eye movements   Cymbalta [Duloxetine Hcl] Nausea And Vomiting    insomnia   Dexilant [Dexlansoprazole] Nausea Only   Doxycycline Nausea And Vomiting   Flagyl [Metronidazole Hcl] Nausea And Vomiting    Skin rash   Gabapentin Nausea And Vomiting and Other (See Comments)    Agitation   Penicillins    Penicillins Cross Reactors Nausea And Vomiting    Skin rash/hives   Septra [Sulfamethoxazole-Trimethoprim] Other (See Comments)    Severe upset stomach, nausea, stomach pain, over heating.    Tizanidine Nausea And Vomiting and Other (See Comments)    Agitation   Topamax [Topiramate] Other (See Comments)    Mood change, anxiety,  headaches   Toradol [Ketorolac Tromethamine] Other (See Comments)    Anxiety.   Zofran [Ondansetron Hcl]     Headaches    Amoxicillin Nausea And Vomiting and Anxiety    Has patient had a PCN reaction causing immediate rash, facial/tongue/throat swelling, SOB or lightheadedness with hypotension:  Has patient had a PCN reaction causing severe rash involving mucus membranes or skin necrosis:  Has patient had a PCN reaction that required hospitalization  Has patient had a PCN reaction occurring within the last 10 years:  If all of the above answers are "NO", then may proceed with Cephalosporin use.    Breo Ellipta [Fluticasone Furoate-Vilanterol] Anxiety, Other (See Comments) and Palpitations   Diflucan [Fluconazole] Diarrhea and Palpitations    Stomach pain, pancreatitis flare up, severe headache    Inapsine [Droperidol] Nausea And Vomiting   Ketamine Anxiety    Agitation, insomnia    Levofloxacin Palpitations and Other (See Comments)    Light headed, anxiety, blood rushing, shaking, sweating.l    Lyrica [Pregabalin] Other (See Comments)    Mood changes- become listless, disinterested   Phenergan [Promethazine Hcl] Anxiety   Phenothiazines Hives, Nausea And Vomiting and Rash   Primaxin [Imipenem] Nausea And Vomiting   Prozac [Fluoxetine Hcl] Anxiety   Reglan [Metoclopramide Hcl] Anxiety     Outpatient Medications Prior to Visit  Medication Sig Dispense Refill   albuterol (VENTOLIN HFA) 108 (90 Base) MCG/ACT inhaler Inhale 1-2 puffs into the lungs every 6 (six) hours as needed for wheezing. Asthma 18 g 6   BAYER CONTOUR NEXT TEST test strip USE TO CHECK BLOOD SUGAR 4X A DAY (B08.9)  5   Blood Glucose Calibration (ONETOUCH VERIO) SOLN LEVEL 3 (MID) AS DIRECTED USE TO CHECK GLUCOSE METER IN VITRO     Blood Glucose Monitoring Suppl (ONETOUCH VERIO FLEX SYSTEM) w/Device KIT USE TO CHECK BLOOD SUGARS USE TO CHECK BLOOD SUGARS DX  E08.9 FINGERSTICK     budesonide-formoterol (SYMBICORT)  80-4.5 MCG/ACT inhaler Inhale 2 puffs into the lungs in the morning and at bedtime. 10.2 g 12   calcium carbonate (TUMS EX) 750 MG chewable tablet Chew 2-4 tablets by mouth 2 (two) times daily as needed for heartburn. No more than 10 a day     carboxymethylcellulose (REFRESH PLUS) 0.5 % SOLN 1-2 drops every 2 (two) hours as needed for Dry eyes     diclofenac sodium (VOLTAREN) 1 % GEL APPLY 3 GRAMS TO 3 LARGE JOINTS UP TO 3 TIMES DAILY AS NEEDED 300 g 3   Docusate Sodium (DSS) 100 MG CAPS Take 1 capsule by mouth as needed.     Homeopathic Products ([place]) LIQD daily.     HYDROcodone-acetaminophen (NORCO/VICODIN) 5-325 MG per tablet Take 1-2 tablets by mouth every 6 (six) hours  as needed for moderate pain.      ibuprofen (ADVIL,MOTRIN) 200 MG tablet Take 400-800 mg by mouth every 8 (eight) hours as needed for moderate pain.     Insulin Glargine (BASAGLAR KWIKPEN) 100 UNIT/ML SOPN Inject 22 Units into the skin 2 (two) times daily. 26 units am, 22 units pm     insulin lispro (INSULIN LISPRO) 100 UNIT/ML KwikPen Junior Inject into the skin.     Lidocaine-Menthol (CBD4 FREEZE PUMP MAXIMUM STR) 4-1 % CREA 1 application as needed     Magnesium 300 MG CAPS Take 300 mg by mouth every evening.      MICROLET LANCETS MISC AS DIRECTED FOUR TIMES A DAY 30 DAYS  2   Multiple Vitamins-Minerals (ALIVE WOMENS GUMMY PO) Take 2 tablets by mouth daily after breakfast.      omeprazole (PRILOSEC) 20 MG capsule Take 20 mg by mouth every morning.      polyethylene glycol (MIRALAX / GLYCOLAX) packet Take 17 g by mouth daily as needed for mild constipation.     Probiotic Product (PROBIOTIC DAILY PO) Take 1 capsule by mouth daily. Reported on 01/16/2016     rizatriptan (MAXALT) 10 MG tablet Take 10 mg by mouth as needed for migraine. May repeat in 2 hours if needed for headaches     ZENPEP 25000 units CPEP Take 1-3 capsules by mouth 3 (three) times daily with meals as needed. 3-4 caps per meal and 1-2 caps per snack   11   DULERA 100-5 MCG/ACT AERO INHALE 2 PUFFS INTO THE LUNGS IN THE MORNING AND AT BEDTIME. 1 each 5   OVER THE COUNTER MEDICATION daily. (Patient not taking: Reported on 05/02/2021)     No facility-administered medications prior to visit.          Objective:   Physical Exam  Vitals:   05/11/21 1624  BP: 118/72  Pulse: 88  Temp: (!) 97.4 F (36.3 C)  TempSrc: Temporal  SpO2: 99%  Weight: 149 lb (67.6 kg)  Height: _0  (1.626 m)   Gen: Pleasant, well-nourished, in no distress,  normal affect  ENT: No lesions,  mouth clear,  oropharynx clear, no postnasal drip  Neck: No JVD, no stridor  Lungs: No use of accessory muscles, no crackles or wheezing on normal respiration, no wheeze on forced expiration  Cardiovascular: RRR, heart sounds normal, no murmur or gallops, no peripheral edema  Musculoskeletal: No deformities, no cyanosis or clubbing  Neuro: alert, awake, non focal  Skin: Warm, no lesions or rash     Assessment & Plan:   Asthma Moderate obstruction with a positive bronchodilator response 2017.  She is been doing well recently, tolerated the change from [facility] to Symbicort.  Minimal albuterol use.  Her last exacerbation was in the fall when she had COVID-19.  She has the most trouble in the summer months, July.  She will call if she has any flaring symptoms.  Please continue your Symbicort 2 puffs twice a day.  Rinse and gargle after using. Keep your albuterol available to use 2 puffs when needed for shortness of breath, chest tightness, wheezing. Continue nasal saline rinses as you have been doing them. Follow Dr. Lamonte Sakai in 6 months or sooner if you have any problems.  Pulmonary nodules/lesions, multiple Small pulmonary nodules all less than 3 mm on CT from 10/07/2020.  In a low risk patient no follow-up indicated.   Baltazar Apo, MD, PhD 05/11/2021, 4:57 PM [facility] [phone] or if  no answer before 7:00PM call [phone] For  any issues after 7:00PM please call eLink [phone]

## 2021-05-12 IMAGING — CT CT CHEST W/ CM
1 series · 15 of 34 positions shown, 19 images · IV contrast (APPLIED)
Comparison: 07/01/2010

CLINICAL DATA: Chest pain for 1 year.  Abnormal chest x-ray

EXAM:
CT CHEST WITH CONTRAST
TECHNIQUE: Multidetector CT imaging of the chest was performed during
intravenous contrast administration.
CONTRAST:  75mL [id number] IOPAMIDOL ([id number]) INJECTION 61%

[Series 2: chest w/cm · axial · 0.67mm/px · z∈[-323,-69]mm · 15 of 151 slices shown, 19 images]
[im 12/151  mediastinal]
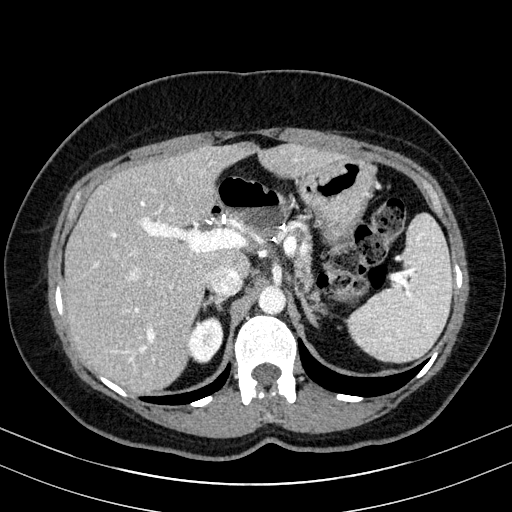
[im 12/151  lung]
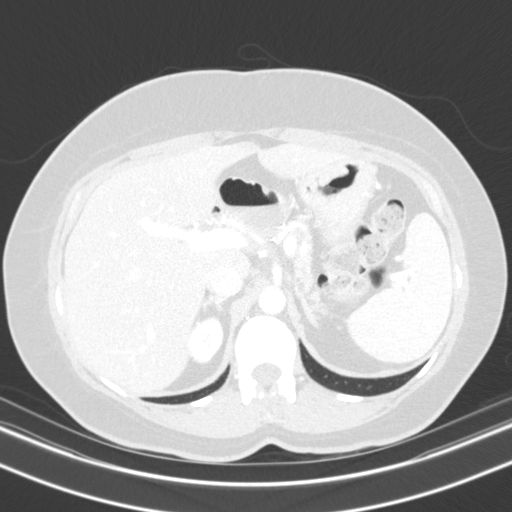
[im 23/151  lung]
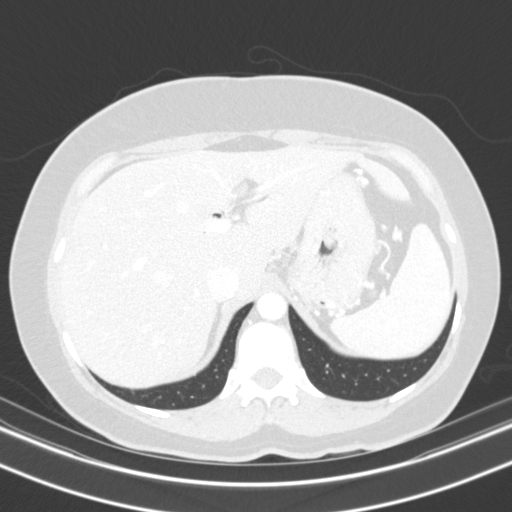
[im 31/151  lung]
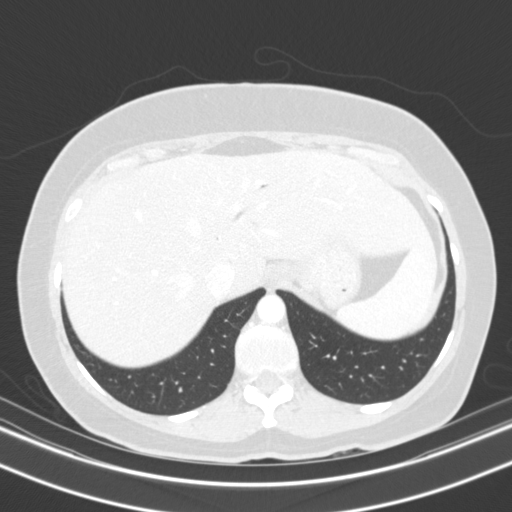
[im 39/151  lung]
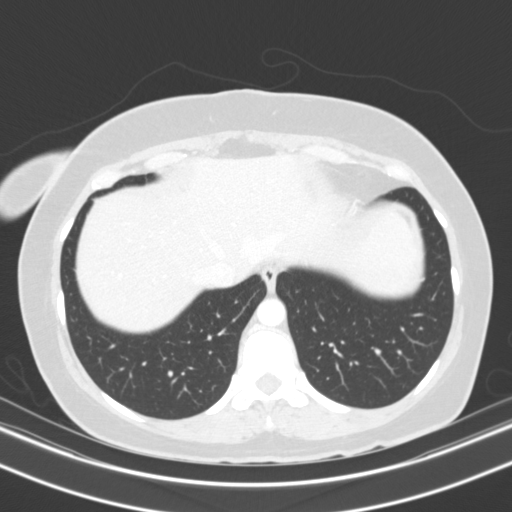
[im 51/151  mediastinal]
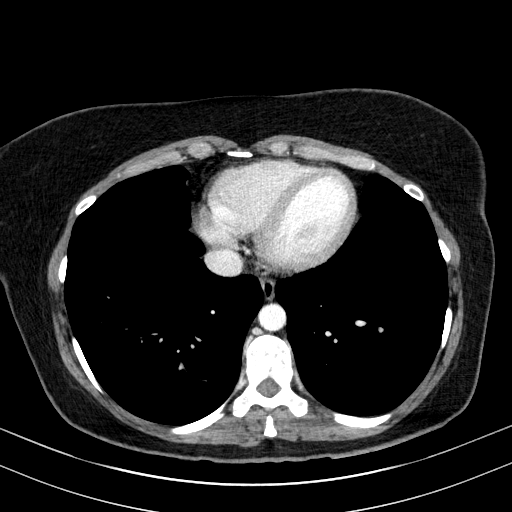
[im 51/151  lung]
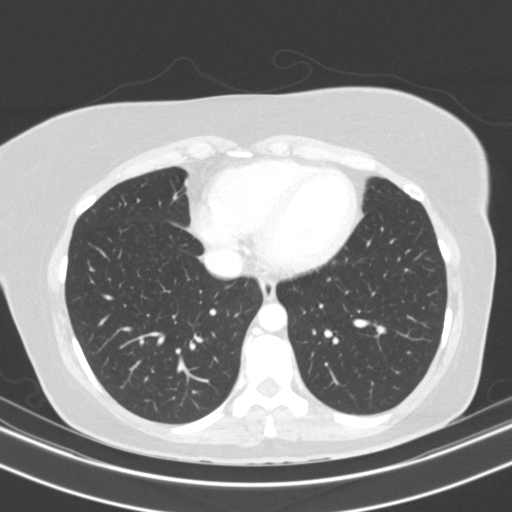
[im 61/151  lung]
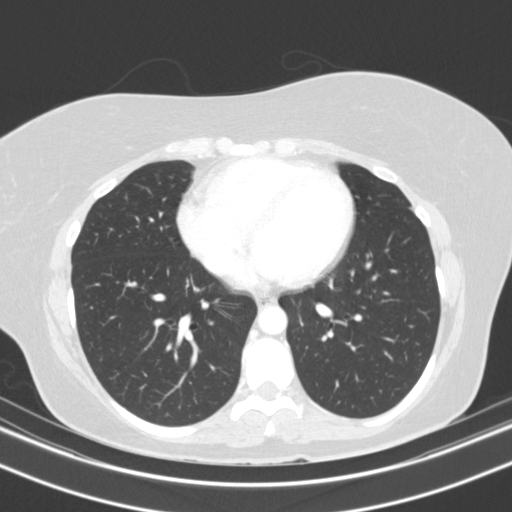
[im 67/151  lung]
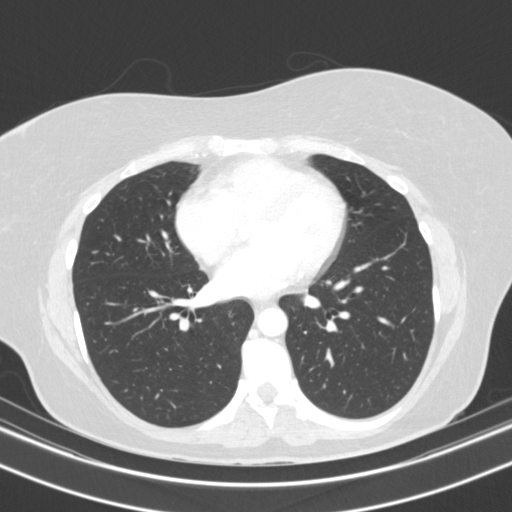
[im 78/151  lung]
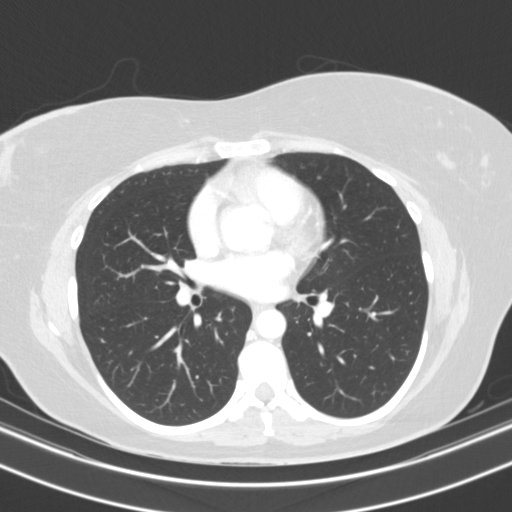
[im 84/151  mediastinal]
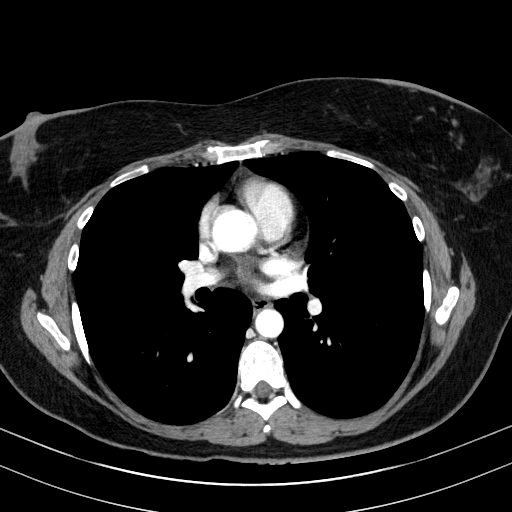
[im 84/151  lung]
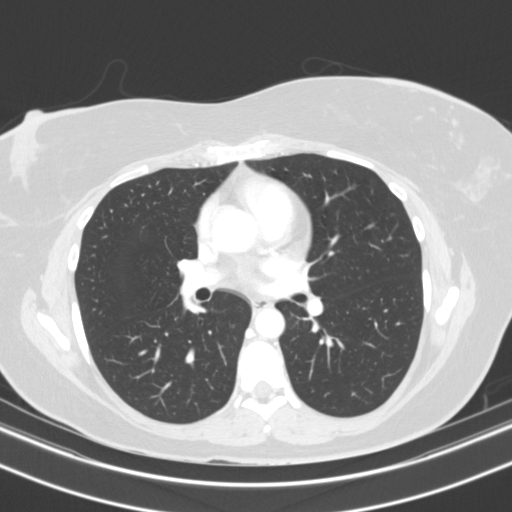
[im 91/151  lung]
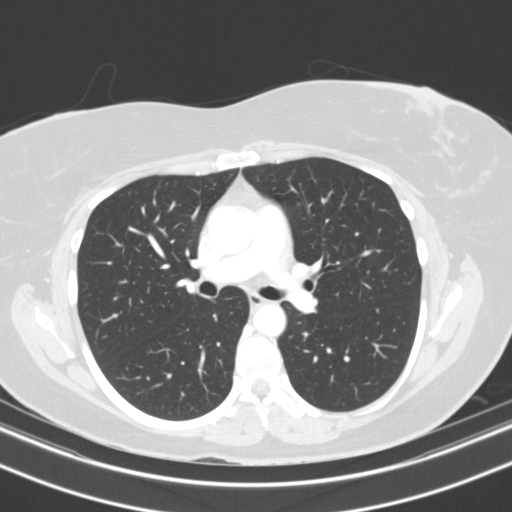
[im 101/151  lung]
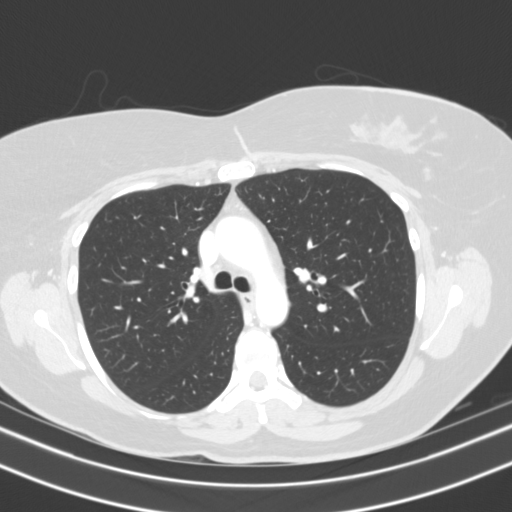
[im 112/151  lung]
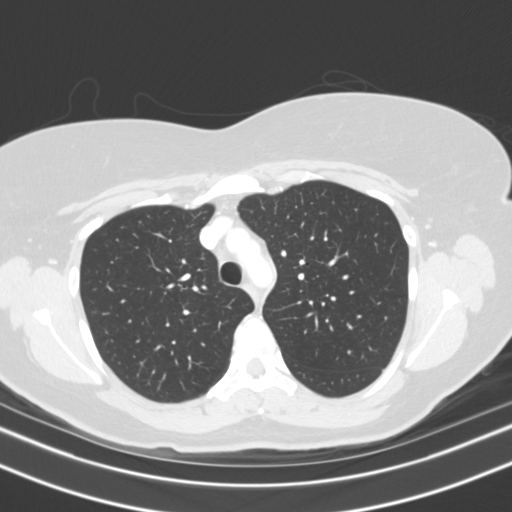
[im 121/151  mediastinal]
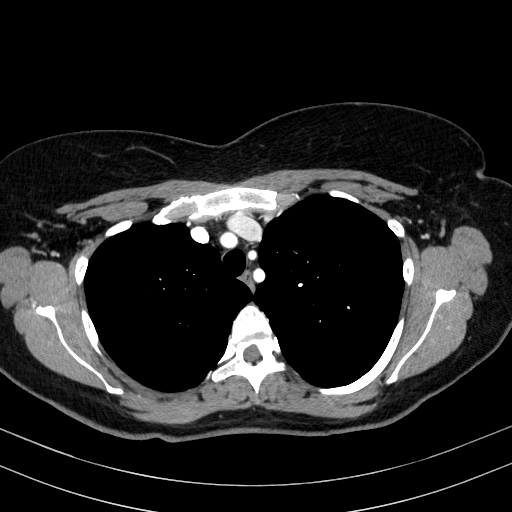
[im 121/151  lung]
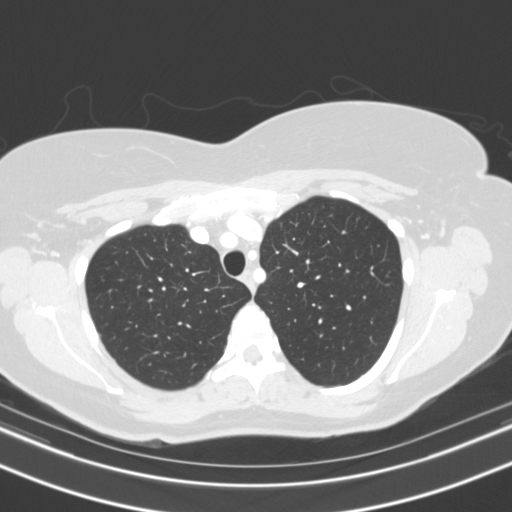
[im 128/151  lung]
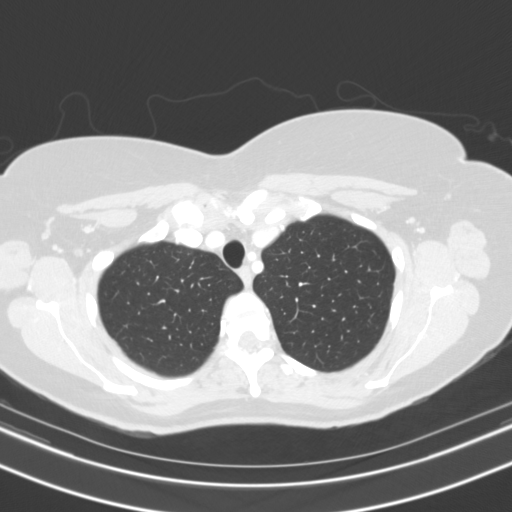
[im 139/151  lung]
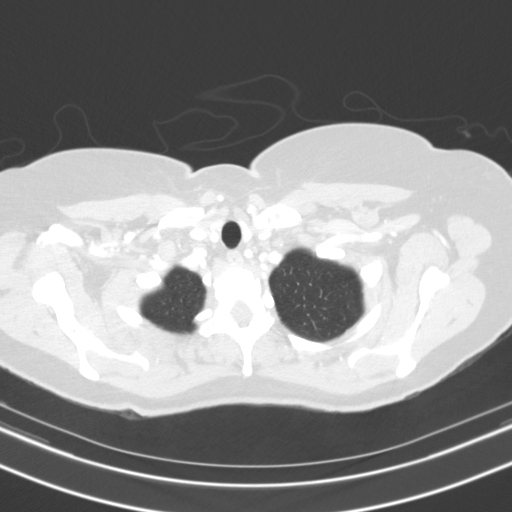

[15 of 34 positions shown; findings below may reference images not displayed]

FINDINGS: Cardiovascular: No significant vascular findings. Normal heart size.
No pericardial effusion.

Mediastinum/Nodes: Negative for adenopathy or mass

Lungs/Pleura: There is no edema, consolidation, effusion, or
pneumothorax. There are a few 2-3 mm pulmonary nodules in the
peripheral lung as measured in the right lower lobe on series 100.
Additional even smaller nodules are also marked on this series. The
right lower lobe was previously diseased and comparison with 6933 CT
is of limited utility.

Upper Abdomen: Changes of Whipple procedure.

Musculoskeletal: Negative
IMPRESSION: 1. No explanation for chest pain. No airway thickening as suspected
on prior chest x-ray.
2. Small scattered pulmonary nodules, peripheral and likely lymph
nodes, measuring up to 3 mm. No follow-up needed if patient is
low-risk. Non-contrast chest CT can be considered in 12 months if
patient is high-risk. This recommendation follows the consensus
statement: Guidelines for Management of Incidental Pulmonary Nodules
Detected on CT Images: From the [HOSPITAL] 2314; Radiology

## 2021-06-14 ENCOUNTER — Other Ambulatory Visit: Payer: Self-pay

## 2021-06-14 ENCOUNTER — Ambulatory Visit (INDEPENDENT_AMBULATORY_CARE_PROVIDER_SITE_OTHER): Payer: 59 | Admitting: Addiction (Substance Use Disorder)

## 2021-06-14 DIAGNOSIS — F429 Obsessive-compulsive disorder, unspecified: Secondary | ICD-10-CM

## 2021-06-14 DIAGNOSIS — F3181 Bipolar II disorder: Secondary | ICD-10-CM | POA: Diagnosis not present

## 2021-06-14 NOTE — Progress Notes (Signed)
[facility] Counselor Initial Adult Exam  Name: Heather Wagner Date: 06/14/2021 MRN: [id number] DOB: 11/01/1976 PCP: Kelton Pillar, MD  Time spent: 28mns  Reason for Visit /Presenting Problem: Client reports: "Ive always been high strung and struggled with mood swing cycles.: Client reported symptoms like: "always being in my head", OCD, Panic attacks, racing thoughts, etc. Client reported that "emotional stress triggers a pancreas attack so if I get really stressed I end up in the hospital. Also, If something is bothering me, I tend to hyperfocus and obsess about things." Client shared her history with her physical health issues & how that began to impact her emotional health as she got sick following a pancreatitis attack that caused a GI blockage that led to a life-saving surgery. 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 Client reported the progression of her anxiety and MH worsening over the years, esp as she has been disabled and lonely. Client processed having temptations against her values and struggling with risky behavior. Client reported increase of these issues due to loneliness and issues in her marriage. Client reported sexual issues and struggling with satisfaction in their relationship. Therapist assessed client for Mh symptoms and encouraged client to seek a medication provider to help with stabilization and help her mood swings& panic attacks & racing thoughts. Therapist built rapport with client. Client participated in the treatment planning of their therapy. Client agreed with the plan if there is a crisis: contact after hours office line, call [phone] and/or crisis line given by therapist.    Mental Status Exam:    Appearance:   Neat     Behavior:  Sharing  Motor:  Restlestness  Speech/Language:   Clear and Coherent and Pressured  Affect:  Congruent and Full Range  Mood:  anxious and sad  Thought process:  circumstantial, flight of ideas,  and tangential  Thought content:    Obsessions and Rumination  Sensory/Perceptual disturbances:    WNL  Orientation:  x4  Attention:  Fair  Concentration:  Good  Memory:  WNL  Fund of knowledge:   Good  Insight:    Fair  Judgment:   Fair  Impulse Control:  Fair   Reported Symptoms:  "in my head", OCD, Panic attacks, racing thoughts, risky behaviors, racing speech & hypomania.  Risk Assessment: Danger to Self:  No Self-injurious Behavior: No Danger to Others: No Duty to Warn:no Physical Aggression / Violence:No  Access to Firearms a concern: No  Gang Involvement:No  Patient / guardian was educated about steps to take if suicide or homicide risk level increases between visits: yes While future psychiatric events cannot be accurately predicted, the patient does not currently require acute inpatient psychiatric care and does not currently meet [facility] criteria.  Substance Abuse History: Current substance abuse: No     Past Psychiatric History:   Previous psychological history is significant for anxiety [facility]patient [facility] History of Psych Hospitalization: No   Abuse History: Victim of No.,  Victim of Neglect:No. Witness / Exposure to Domestic Violence: No   Protective Services Involvement: No  Witness to [employer]iolence:  No   Family History:  Family History  Problem Relation Age of Onset   Asthma Mother    Pancreatic cancer Mother    High Cholesterol Father    Macular degeneration Father    Migraines Sister    Endometriosis Sister    Asthma Brother    GER  disease Brother    Pancreatitis Paternal Grandfather    Pancreatic cancer Other    Pancreatitis Maternal Uncle    Living situation: the patient lives with their spouse  Sexual Orientation:  Straight  Relationship Status: married  Name of spouse / other: Laverna Peace- married 59 years             If a parent, number of children / ages:  cant have kids but would love to foster  Support Systems; spouse & church family  Financial Stress:  Yes   Income/Employment/Disability: Long-Term Disability- since 2010.  Military Service: No   Educational History: Education: Forensic psychologist- education degree  Religion/Sprituality/World View:   Protestant  Any cultural differences that may affect / interfere with treatment:  not applicable   Recreation/Hobbies: reading, crafting, gardening, being social at church  Stressors:Health problems Traumatic event Other: panic attacks, anxiety  Strengths:  Family, Church, Spirituality, Hopefulness, and Able to Communicate Effectively  Legal History: Pending legal issue / charges: The patient has no significant history of legal issues.  Medical History/Surgical History:reviewed Past Medical History:  Diagnosis Date   Asthma    Bursitis    Chronic sinusitis    DDD (degenerative disc disease), cervical    DDD (degenerative disc disease), lumbar    Diabetes mellitus    Type 1    Fibromyalgia    Hyperlipidemia    IBS (irritable bowel syndrome)    Migraine without aura, with intractable migraine, so stated, without mention of status migrainosus 01/25/2014   Migraines    Osteoarthritis of both knees 10/24/2016   moderate   Pancreatitis chronic    Pelvic floor dysfunction 10/24/2016   Sjogren's syndrome ([place])    Vulvar vestibulitis     Past Surgical History:  Procedure Laterality Date   ABDOMINAL HYSTERECTOMY  05/27/2020   [facility] - per patient   BREAST REDUCTION SURGERY  2010   DILATION AND CURETTAGE OF UTERUS  2007   ERCP     Removed stone from Bile duct   ERCP     with MRI   [place]   whiple  1997   whipple's     Medications: Current Outpatient Medications  Medication Sig Dispense Refill   albuterol (VENTOLIN HFA) 108 (90 Base) MCG/ACT inhaler Inhale 1-2 puffs into the lungs every 6 (six) hours as needed for wheezing. Asthma 18 g 6   BAYER CONTOUR  NEXT TEST test strip USE TO CHECK BLOOD SUGAR 4X A DAY (B08.9)  5   Blood Glucose Calibration (ONETOUCH VERIO) SOLN LEVEL 3 (MID) AS DIRECTED USE TO CHECK GLUCOSE METER IN VITRO     Blood Glucose Monitoring Suppl (ONETOUCH VERIO FLEX SYSTEM) w/Device KIT USE TO CHECK BLOOD SUGARS USE TO CHECK BLOOD SUGARS DX  E08.9 FINGERSTICK     budesonide-formoterol (SYMBICORT) 80-4.5 MCG/ACT inhaler Inhale 2 puffs into the lungs in the morning and at bedtime. 10.2 g 12   calcium carbonate (TUMS EX) 750 MG chewable tablet Chew 2-4 tablets by mouth 2 (two) times daily as needed for heartburn. No more than 10 a day     carboxymethylcellulose (REFRESH PLUS) 0.5 % SOLN 1-2 drops every 2 (two) hours as needed for Dry eyes     diclofenac sodium (VOLTAREN) 1 % GEL APPLY 3 GRAMS TO 3 LARGE JOINTS UP TO 3 TIMES DAILY AS NEEDED 300 g 3   Docusate Sodium (DSS) 100 MG CAPS Take 1 capsule by mouth as needed.     Homeopathic Products [facility]  RELIEF) LIQD daily.     HYDROcodone-acetaminophen (NORCO/VICODIN) 5-325 MG per tablet Take 1-2 tablets by mouth every 6 (six) hours as needed for moderate pain.      ibuprofen (ADVIL,MOTRIN) 200 MG tablet Take 400-800 mg by mouth every 8 (eight) hours as needed for moderate pain.     Insulin Glargine (BASAGLAR KWIKPEN) 100 UNIT/ML SOPN Inject 22 Units into the skin 2 (two) times daily. 26 units am, 22 units pm     insulin lispro (INSULIN LISPRO) 100 UNIT/ML KwikPen Junior Inject into the skin.     Lidocaine-Menthol (CBD4 FREEZE PUMP MAXIMUM STR) 4-1 % CREA 1 application as needed     Magnesium 300 MG CAPS Take 300 mg by mouth every evening.      MICROLET LANCETS MISC AS DIRECTED FOUR TIMES A DAY 30 DAYS  2   Multiple Vitamins-Minerals (ALIVE WOMENS GUMMY PO) Take 2 tablets by mouth daily after breakfast.      omeprazole (PRILOSEC) 20 MG capsule Take 20 mg by mouth every morning.      polyethylene glycol (MIRALAX / GLYCOLAX) packet Take 17 g by mouth daily as needed for mild  constipation.     Probiotic Product (PROBIOTIC DAILY PO) Take 1 capsule by mouth daily. Reported on 01/16/2016     rizatriptan (MAXALT) 10 MG tablet Take 10 mg by mouth as needed for migraine. May repeat in 2 hours if needed for headaches     ZENPEP 25000 units CPEP Take 1-3 capsules by mouth 3 (three) times daily with meals as needed. 3-4 caps per meal and 1-2 caps per snack  11   No current facility-administered medications for this visit.   Diagnoses:    ICD-10-CM   1. Bipolar II disorder ([place])  F31.81     2. Obsessive-compulsive disorder, unspecified type  F42.9      Plan of Care:  Client is to return to therapy with therapist every 1-2 weeks as needed to process pain/grief/anxiety in a safe space, to be re-evaluated in 3 months.  Client is to consider seeing a medication provider to explore possible need for medication to regulate her mood swings and decrease risky behaviors that go against her values. Client is to practice mindfulness AEB daily meditation and body scans or as needed when flooded by emotion/pain or feeling numb.  Client is to learn/practice DBT wise mind & radical acceptance AEB understanding they don't have to live in extremes and can feel both happy and sad emotions, and good and bad memories and allowing themselves to do so.  Client is to practice self-compassion AEB being gentle with themselves, utilizing self-care techniques daily or as needed when grieving something in the moment.  Client is to process grief/pain of their loss in a somatic body-felt sense way: ie using mindfulness, brainspotting, or trauma release as a method for releasing body pain/tension caused by grief. Client to engage in positive self talk and challenging negative internal ruminations and self talk causing client to be overly anxious and worried using CBT, on daily practice. Client to engage in mindfulness: ie body scans each eveneing to help process and discharge emotional distress & recognize  emotions. Client to utilize BSP (brainspotting) with therapist to help client regulate their anxiety in a somatic- felt body sense way: (ie by working to reduce muscle tension, ruminations, increased heart rate, constant worrying and feeling "hyper" ) by decreasing anxiety by 33% in the next 6 months.  Client to understand how to succeed in fighting back worry  and embrace life's uncertainty.  Barnie Del, LCSW, LCAS, CCTP, CCS

## 2021-06-29 ENCOUNTER — Ambulatory Visit: Payer: 59 | Admitting: Addiction (Substance Use Disorder)

## 2021-07-06 ENCOUNTER — Ambulatory Visit: Payer: 59 | Admitting: Behavioral Health

## 2021-07-20 ENCOUNTER — Ambulatory Visit: Payer: 59 | Admitting: Addiction (Substance Use Disorder)

## 2021-08-10 MED ORDER — ALBUTEROL SULFATE HFA 108 (90 BASE) MCG/ACT IN AERS
1.0000 | INHALATION_SPRAY | Freq: Four times a day (QID) | RESPIRATORY_TRACT | 6 refills | Status: DC | PRN
Start: 2021-08-10 — End: 2022-06-22

## 2021-08-10 NOTE — Telephone Encounter (Signed)
Patient sent message through my chart requesting Proair prescription to be changed to albuterol HFA.  Per Dr. Kavin Leech LOV note Patient is to continue albuterol.  Albuterol HFA is on current med list. New Albuterol prescription sent to requested pharmacy.

## 2021-08-14 ENCOUNTER — Ambulatory Visit (INDEPENDENT_AMBULATORY_CARE_PROVIDER_SITE_OTHER): Payer: 59 | Admitting: Addiction (Substance Use Disorder)

## 2021-08-14 ENCOUNTER — Other Ambulatory Visit: Payer: Self-pay

## 2021-08-14 DIAGNOSIS — F411 Generalized anxiety disorder: Secondary | ICD-10-CM

## 2021-08-14 NOTE — Progress Notes (Signed)
[facility] Counselor/Therapist Progress Note  Patient ID: Heather Wagner, MRN: [id number],    Date: 08/14/2021  Time Spent:  Treatment Type: Individual Therapy  Reported Symptoms: reduced panic attacks, some feelings of high strung  Mental Status Exam:  Appearance:   Neat     Behavior:  Sharing  Motor:  Normal  Speech/Language:   Clear and Coherent  Affect:  Appropriate  Mood:  anxious  Thought process:  flight of ideas  Thought content:    WNL  Sensory/Perceptual disturbances:    WNL  Orientation:  x4  Attention:  Good  Concentration:  Good  Memory:  WNL  Fund of knowledge:   Good  Insight:    Fair  Judgment:   Fair  Impulse Control:  Good   Risk Assessment: Danger to Self:  No Self-injurious Behavior: No Danger to Others: No Duty to Warn:no Physical Aggression / Violence:No  Access to Firearms a concern: No  Gang Involvement:No   Subjective: Client reported having panic attacks and becoming more hormonal after having a hysterectomy. Client reported having a lot of hormonal fluxes and mood swings that were causing the increased anxiety and risky thoughts. Client just having ongoing generalized anxiety and just being "high strung". Client reported having only had panic attacks after having an ovary removed and her mood shifts being completely non existent now. Therapist moved dx of bipolar dx after client reported talking with her doctor about it being from a physical/hormonal trigger. Client reported about her emotional changes over the years and therapist and client used narrative therapy to understand more about her roots of anxiety and moods, and therapist ruled out the bipolar dx. Client identified her health being a trigger for the issues of intimacy with her husband and therapist used MI to validate the trigger and support client as she processe.   Interventions: Motivational Interviewing and Narrative  Diagnosis:   ICD-10-CM   1. Generalized  anxiety disorder  F41.1       Plan of Care:  Client is to return to therapy with therapist every 1-2 weeks as needed to process pain/grief/anxiety in a safe space, to be re-evaluated in 3 months.  Client is to consider seeing a medication provider to explore possible need for medication to regulate her mood swings and decrease risky behaviors that go against her values. Client is to practice mindfulness AEB daily meditation and body scans or as needed when flooded by emotion/pain or feeling numb.  Client is to learn/practice DBT wise mind & radical acceptance AEB understanding they don't have to live in extremes and can feel both happy and sad emotions, and good and bad memories and allowing themselves to do so.  Client is to practice self-compassion AEB being gentle with themselves, utilizing self-care techniques daily or as needed when grieving something in the moment.  Client is to process grief/pain of their loss in a somatic body-felt sense way: ie using mindfulness, brainspotting, or trauma release as a method for releasing body pain/tension caused by grief. Client to engage in positive self talk and challenging negative internal ruminations and self talk causing client to be overly anxious and worried using CBT, on daily practice. Client to engage in mindfulness: ie body scans each eveneing to help process and discharge emotional distress & recognize emotions. Client to utilize BSP (brainspotting) with therapist to help client regulate their anxiety in a somatic- felt body sense way: (ie by working to reduce muscle tension, ruminations, increased heart rate, constant  worrying and feeling "hyper" ) by decreasing anxiety by 33% in the next 6 months.  Client to understand how to succeed in fighting back worry and embrace life's uncertainty.  Pauline Good, LCSW, LCAS, CCTP, CCS, CCTP

## 2021-08-29 ENCOUNTER — Other Ambulatory Visit: Payer: Self-pay

## 2021-08-29 ENCOUNTER — Ambulatory Visit (INDEPENDENT_AMBULATORY_CARE_PROVIDER_SITE_OTHER): Payer: 59 | Admitting: Addiction (Substance Use Disorder)

## 2021-08-29 DIAGNOSIS — F411 Generalized anxiety disorder: Secondary | ICD-10-CM | POA: Diagnosis not present

## 2021-08-29 NOTE — Progress Notes (Signed)
[facility] Counselor/Therapist Progress Note  Patient ID: Heather Wagner, MRN: [id number],    Date: 08/29/2021  Time Spent:  Treatment Type: Individual Therapy  Reported Symptoms: some anxiety  Mental Status Exam:  Appearance:   Neat     Behavior:  Sharing  Motor:  Normal  Speech/Language:   Clear and Coherent  Affect:  Appropriate  Mood:  anxious  Thought process:  normal  Thought content:    WNL  Sensory/Perceptual disturbances:    WNL  Orientation:  x4  Attention:  Good  Concentration:  Good  Memory:  WNL  Fund of knowledge:   Good  Insight:    Fair  Judgment:   Fair  Impulse Control:  Good   Risk Assessment: Danger to Self:  No Self-injurious Behavior: No Danger to Others: No Duty to Warn:no Physical Aggression / Violence:No  Access to Firearms a concern: No  Gang Involvement:No   Subjective: Client reported "feeling a lot more normal and hopeful." Client feeling like herself more & more calm these days, since her hormones balanced out. Client had little to say about her emotional state, but is dealing with some isolation due to her medical conditions and having to be on disability. Client reported struggling with sleep overall but it working well to sleep in separate rooms than her husband. Client has been enjoying doing some updates on their new  first house. Client talked about some of their dreams for their home and life together; therapist used MI & Narrative with client to validate her thoughts about her and her husband's dreams and to discuss their past life story. Client reported feeling some anxiety about her future finding meaning in life and not working. Therapist assessed for stability and client denied SI/HI/AVH and reported less "mania-like behavior" and no panic attacks.  Interventions: Motivational Interviewing and Narrative  Diagnosis:   ICD-10-CM   1. Generalized anxiety disorder  F41.1        Plan of Care:  Client is to  return to therapy with therapist every 1-2 weeks as needed to process pain/grief/anxiety in a safe space, to be re-evaluated in 3 months.  Client is to consider seeing a medication provider to explore possible need for medication to regulate her mood swings and decrease risky behaviors that go against her values. Client is to practice mindfulness AEB daily meditation and body scans or as needed when flooded by emotion/pain or feeling numb.  Client is to learn/practice DBT wise mind & radical acceptance AEB understanding they don't have to live in extremes and can feel both happy and sad emotions, and good and bad memories and allowing themselves to do so.  Client is to practice self-compassion AEB being gentle with themselves, utilizing self-care techniques daily or as needed when grieving something in the moment.  Client is to process grief/pain of their loss in a somatic body-felt sense way: ie using mindfulness, brainspotting, or trauma release as a method for releasing body pain/tension caused by grief. Client to engage in positive self talk and challenging negative internal ruminations and self talk causing client to be overly anxious and worried using CBT, on daily practice. Client to engage in mindfulness: ie body scans each eveneing to help process and discharge emotional distress & recognize emotions. Client to utilize BSP (brainspotting) with therapist to help client regulate their anxiety in a somatic- felt body sense way: (ie by working to reduce muscle tension, ruminations, increased heart rate, constant worrying and feeling "hyper" )  by decreasing anxiety by 33% in the next 6 months.  Client to understand how to succeed in fighting back worry and embrace life's uncertainty.  Pauline Good, LCSW, LCAS, CCTP, CCS, CCTP

## 2021-09-03 ENCOUNTER — Other Ambulatory Visit: Payer: Self-pay | Admitting: Adult Health

## 2021-09-12 ENCOUNTER — Ambulatory Visit: Payer: 59 | Admitting: Addiction (Substance Use Disorder)

## 2021-09-13 ENCOUNTER — Telehealth: Payer: Self-pay

## 2021-09-13 NOTE — Telephone Encounter (Signed)
  Medication name and strength: Albuterol Sulfate 109(90) base Ordering Provider: Levy Pupa MD  Was PA started with [facility]?: yes If yes, please enter KEY: [id number] Medication tried and failed: symbicort, Albuterol, Dulera, Flovent, Pulmicort, proventil   PA sent to plan, time frame for approval / denial: waiting for response Routing to [facility] Triage  for follow-up

## 2021-09-18 NOTE — Telephone Encounter (Signed)
PA is still pending.  

## 2021-09-19 NOTE — Telephone Encounter (Signed)
Is there some reason she can't take proventil? - appears to be the version of albuterol that her insurance prefers

## 2021-09-19 NOTE — Telephone Encounter (Signed)
Spoke with who will be picking up generic albuterol inhaler today. Pt stated she will let us know if she needs anything concerning the inhaler. Pt also requested and spacer which RN left up front for pt to pick up. Nothing further needed at this time.

## 2021-09-19 NOTE — Telephone Encounter (Signed)
Attempted to call pt but unable to reach. Left message for her to return call. 

## 2021-09-19 NOTE — Telephone Encounter (Signed)
Hi Dr Delton Coombes,  PA for Albuterol Sulfate 109(90) base was denied. What would you like to do?

## 2021-09-26 ENCOUNTER — Ambulatory Visit (INDEPENDENT_AMBULATORY_CARE_PROVIDER_SITE_OTHER): Payer: 59 | Admitting: Addiction (Substance Use Disorder)

## 2021-09-26 ENCOUNTER — Other Ambulatory Visit: Payer: Self-pay

## 2021-09-26 DIAGNOSIS — F411 Generalized anxiety disorder: Secondary | ICD-10-CM | POA: Diagnosis not present

## 2021-09-26 NOTE — Progress Notes (Signed)
[facility] Counselor/Therapist Progress Note  Patient ID: Jorgina Binning, MRN: [id number],    Date: 09/26/2021  Time Spent: 54  Treatment Type: Individual Therapy  Reported Symptoms: feeling more stable  Mental Status Exam:  Appearance:   Neat     Behavior:  Sharing  Motor:  Normal  Speech/Language:   Clear and Coherent  Affect:  Appropriate  Mood:  normal  Thought process:  normal  Thought content:    WNL  Sensory/Perceptual disturbances:    WNL  Orientation:  x4  Attention:  Good  Concentration:  Good  Memory:  WNL  Fund of knowledge:   Good  Insight:    Good  Judgment:   Good  Impulse Control:  Good   Risk Assessment: Danger to Self:  No Self-injurious Behavior: No Danger to Others: No Duty to Warn:no Physical Aggression / Violence:No  Access to Firearms a concern: No  Gang Involvement:No   Subjective: Client reported feeling more even keeled and reported back about her instability during the time her hormones were jumping all over. Client reported seeing in her chart the bipolar dx and was upset by it. Client talking about how it couldn't have been bipolar and it was a hormone trigger but was unable to figure out why it was happening. Client further processed the trauma of it all while also trying to stay calm and happy in the present. Therapist used MI & Mindfulness with client to validate what she went through and affirm her strength for what she has endured due to physical chronic conditions she has, while also helping her body to regulate and stay in the present and grounded. Therapist encouraged client to practice grounding while processing for the rest of session. Client talked about some upcoming changes to her husband's job and her fear of what could change. Therapist assessed for stability and client denied SI/HI/AVH and reported no panic attacks.  Interventions: Mindfulness Meditation and Motivational Interviewing  Diagnosis:   ICD-10-CM   1.  Generalized anxiety disorder  F41.1       Plan of Care:  Client is to return to therapy with therapist every 1-2 weeks as needed to process pain/grief/anxiety in a safe space, to be re-evaluated in 3 months.  Client is to consider seeing a medication provider to explore possible need for medication to regulate her mood swings and decrease risky behaviors that go against her values. Client is to practice mindfulness AEB daily meditation and body scans or as needed when flooded by emotion/pain or feeling numb.  Client is to learn/practice DBT wise mind & radical acceptance AEB understanding they don't have to live in extremes and can feel both happy and sad emotions, and good and bad memories and allowing themselves to do so.  Client is to practice self-compassion AEB being gentle with themselves, utilizing self-care techniques daily or as needed when grieving something in the moment.  Client is to process grief/pain of their loss in a somatic body-felt sense way: ie using mindfulness, brainspotting, or trauma release as a method for releasing body pain/tension caused by grief. Client to engage in positive self talk and challenging negative internal ruminations and self talk causing client to be overly anxious and worried using CBT, on daily practice. Client to engage in mindfulness: ie body scans each eveneing to help process and discharge emotional distress & recognize emotions. Client to utilize BSP (brainspotting) with therapist to help client regulate their anxiety in a somatic- felt body sense way: (ie  by working to reduce muscle tension, ruminations, increased heart rate, constant worrying and feeling "hyper" ) by decreasing anxiety by 33% in the next 6 months.  Client to understand how to succeed in fighting back worry and embrace life's uncertainty.  Pauline Good, LCSW, LCAS, CCTP, CCS, CCTP

## 2021-10-10 ENCOUNTER — Ambulatory Visit: Payer: 59 | Admitting: Addiction (Substance Use Disorder)

## 2021-10-17 NOTE — Progress Notes (Deleted)
Office Visit Note  Patient: Heather Wagner             Date of Birth: 04/10/1976           MRN: [id number]             PCP: Maurice Small, MD Referring: Maurice Small, MD Visit Date: 10/31/2021 Occupation: @GUAROCC @  Subjective:  No chief complaint on file.   History of Present Illness: Heather Wagner is a 45 y.o. female ***   Activities of Daily Living:  Patient reports morning stiffness for *** {minute/hour:19697}.   Patient {ACTIONS;DENIES/REPORTS:21021675::"Denies"} nocturnal pain.  Difficulty dressing/grooming: {ACTIONS;DENIES/REPORTS:21021675::"Denies"} Difficulty climbing stairs: {ACTIONS;DENIES/REPORTS:21021675::"Denies"} Difficulty getting out of chair: {ACTIONS;DENIES/REPORTS:21021675::"Denies"} Difficulty using hands for taps, buttons, cutlery, and/or writing: {ACTIONS;DENIES/REPORTS:21021675::"Denies"}  No Rheumatology ROS completed.   PMFS History:  Patient Active Problem List   Diagnosis Date Noted   Pulmonary nodules/lesions, multiple 05/11/2021   Intractable pain    Acute on chronic pancreatitis (HCC) 10/28/2017   DDD (degenerative disc disease), cervical 07/12/2017   DDD (degenerative disc disease), lumbar 07/12/2017   Primary insomnia 05/30/2017   Other fatigue 05/30/2017   ANA positive 04/19/2017   History of chronic sinusitis 04/19/2017   Fibromyalgia 10/24/2016   Osteoarthritis of both knees 10/24/2016   Sjogren's syndrome (HCC) 10/24/2016   IBS (irritable bowel syndrome) 10/24/2016   Migraines 10/24/2016   Chronic sinusitis 10/24/2016   Pelvic floor dysfunction 10/24/2016   Vulvar vestibulitis 10/24/2016   Dehydration 07/11/2016   Fatty liver 07/11/2016   Elevated LFTs 07/11/2016   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   Abdominal pain 01/05/2016   Pancreatitis, acute    CAP (community acquired pneumonia) 06/19/2014   Pain management 06/18/2014   Chronic pancreatitis (HCC) 06/18/2014   Intractable migraine without  aura 01/25/2014   Acute pancreatitis 09/25/2013   Leucocytosis 09/25/2013   Microcytic anemia 09/25/2013   Asthma 09/25/2013   Type 2 diabetes mellitus without complication, with long-term current use of insulin (HCC) 09/25/2013   Hyponatremia 09/25/2013    Past Medical History:  Diagnosis Date   Asthma    Bursitis    Chronic sinusitis    DDD (degenerative disc disease), cervical    DDD (degenerative disc disease), lumbar    Diabetes mellitus    Type 1    Fibromyalgia    Hyperlipidemia    IBS (irritable bowel syndrome)    Migraine without aura, with intractable migraine, so stated, without mention of status migrainosus 01/25/2014   Migraines    Osteoarthritis of both knees 10/24/2016   moderate   Pancreatitis chronic    Pelvic floor dysfunction 10/24/2016   Sjogren's syndrome (HCC)    Vulvar vestibulitis     Family History  Problem Relation Age of Onset   Asthma Mother    Pancreatic cancer Mother    High Cholesterol Father    Macular degeneration Father    Migraines Sister    Endometriosis Sister    Asthma Brother    GER disease Brother    Pancreatitis Paternal Grandfather    Pancreatic cancer Other    Pancreatitis Maternal Uncle    Past Surgical History:  Procedure Laterality Date   ABDOMINAL HYSTERECTOMY  05/27/2020   [facility] - per patient   BREAST REDUCTION SURGERY  2010   DILATION AND CURETTAGE OF UTERUS  2007   ERCP     Removed stone from Bile duct   ERCP     with MRI   PANCREAS SURGERY  1997   whiple  1997  whipple's     Social History   Social History Narrative   Not on file   Immunization History  Administered Date(s) Administered   Influenza Split 09/11/2010, 11/30/2013, 09/27/2014, 10/08/2018   Influenza,inj,Quad PF,6+ Mos 09/01/2012, 12/06/2017, 09/29/2018   Influenza-Unspecified 11/10/1998, 10/01/2006, 10/11/2008, 09/02/2014   Pneumococcal Polysaccharide-23 12/05/2010   Tdap 11/30/2004     Objective: Vital Signs: LMP 04/03/2020     Physical Exam   Musculoskeletal Exam: ***  CDAI Exam: CDAI Score: -- Patient Global: --; Provider Global: -- Swollen: --; Tender: -- Joint Exam 10/31/2021   No joint exam has been documented for this visit   There is currently no information documented on the homunculus. Go to the Rheumatology activity and complete the homunculus joint exam.  Investigation: No additional findings.  Imaging: No results found.  Recent Labs: Lab Results  Component Value Date   WBC 14.6 (H) 04/08/2020   HGB 10.0 (L) 04/08/2020   PLT 513 (H) 04/08/2020   NA 134 (L) 04/08/2020   K 3.2 (L) 04/08/2020   CL 98 04/08/2020   CO2 25 04/08/2020   GLUCOSE 130 (H) 04/08/2020   BUN 13 04/08/2020   CREATININE 0.61 04/08/2020   BILITOT 0.3 04/08/2020   ALKPHOS 74 04/08/2020   AST 17 04/08/2020   ALT 17 04/08/2020   PROT 7.8 04/08/2020   ALBUMIN 3.9 04/08/2020   CALCIUM 8.9 04/08/2020   GFRAA >60 04/08/2020    Speciality Comments: No specialty comments available.  Procedures:  No procedures performed Allergies: Ciprofloxacin hcl, Nortriptyline hcl, Amitriptyline, Compazine [prochlorperazine maleate], Cymbalta [duloxetine hcl], Dexilant [dexlansoprazole], Doxycycline, Flagyl [metronidazole hcl], Gabapentin, Penicillins, Penicillins cross reactors, Septra [sulfamethoxazole-trimethoprim], Tizanidine, Topamax [topiramate], Toradol [ketorolac tromethamine], Zofran [ondansetron hcl], Amoxicillin, Breo ellipta [fluticasone furoate-vilanterol], Diflucan [fluconazole], Inapsine [droperidol], Ketamine, Levofloxacin, Lyrica [pregabalin], Phenergan [promethazine hcl], Phenothiazines, Primaxin [imipenem], Prozac [fluoxetine hcl], and Reglan [metoclopramide hcl]   Assessment / Plan:     Visit Diagnoses: No diagnosis found.  Orders: No orders of the defined types were placed in this encounter.  No orders of the defined types were placed in this encounter.   Face-to-face time spent with patient was ***  minutes. Greater than 50% of time was spent in counseling and coordination of care.  Follow-Up Instructions: No follow-ups on file.   Earnestine Mealing, CMA  Note - This record has been created using Editor, commissioning.  Chart creation errors have been sought, but may not always  have been located. Such creation errors do not reflect on  the standard of medical care.

## 2021-10-24 ENCOUNTER — Ambulatory Visit: Payer: 59 | Admitting: Addiction (Substance Use Disorder)

## 2021-10-30 NOTE — Progress Notes (Signed)
Office Visit Note  Patient: Heather Wagner             Date of Birth: [birth year]           MRN: [id number]             PCP: Kelton Pillar, MD Referring: Kelton Pillar, MD Visit Date: 11/09/2021 Occupation: @GUAROCC @  Subjective:  Generalized pain and fatigue   History of Present Illness: Heather Wagner is a 45 y.o. female with a history of fibromyalgia, osteoarthritis and sicca symptoms.  She states her dry mouth and dry eyes symptoms persist but they are not any worse than the last visit.  She had post COVID-19 symptoms which gradually resolved.  She states she had a flare of pancreatitis about 2 weeks ago and she is gradually recovering from since missing up  it.  She continues to have some neck and lower back pain.  She also has off-and-on discomfort in her knee joints.  She has been going to physical therapy and dry needling has been helpful.  She has been walking on a regular basis and doing stretching exercises.  She is off-and-on discomfort in the left trochanteric bursa.  She denies any joint swelling.  She continues to have some generalized pain and discomfort from fibromyalgia flares.  Activities of Daily Living:  Patient reports morning stiffness for 2-3  hours.   Patient Reports nocturnal pain.  Difficulty dressing/grooming: Denies Difficulty climbing stairs: Denies Difficulty getting out of chair: Denies Difficulty using hands for taps, buttons, cutlery, and/or writing: Denies  Review of Systems  Constitutional:  Positive for fatigue.  HENT:  Positive for mouth dryness and nose dryness. Negative for mouth sores.   Eyes:  Positive for dryness. Negative for pain and itching.  Respiratory:  Positive for shortness of breath. Negative for difficulty breathing.   Cardiovascular:  Negative for chest pain.  Gastrointestinal:  Positive for constipation and diarrhea.  Endocrine: Negative for increased urination.  Genitourinary:  Negative for difficulty urinating.   Musculoskeletal:  Positive for joint pain, joint pain, myalgias, morning stiffness, muscle tenderness and myalgias. Negative for joint swelling.  Skin:  Negative for color change, rash and redness.  Allergic/Immunologic: Negative for susceptible to infections.  Neurological:  Negative for dizziness, numbness, headaches and memory loss.  Hematological:  Positive for bruising/bleeding tendency.  Psychiatric/Behavioral:  Negative for confusion.    PMFS History:  Patient Active Problem List   Diagnosis Date Noted   Pulmonary nodules/lesions, multiple 05/11/2021   Intractable pain    Acute on chronic pancreatitis ([place]) 10/28/2017   DDD (degenerative disc disease), cervical 07/12/2017   DDD (degenerative disc disease), lumbar 07/12/2017   Primary insomnia 05/30/2017   Other fatigue 05/30/2017   ANA positive 04/19/2017   History of chronic sinusitis 04/19/2017   Fibromyalgia 10/24/2016   Osteoarthritis of both knees 10/24/2016   IBS (irritable bowel syndrome) 10/24/2016   Migraines 10/24/2016   Chronic sinusitis 10/24/2016   Pelvic floor dysfunction 10/24/2016   Vulvar vestibulitis 10/24/2016   Dehydration 07/11/2016   Fatty liver 07/11/2016   Elevated LFTs 07/11/2016   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   Abdominal pain 01/05/2016   Pancreatitis, acute    CAP (community acquired pneumonia) 06/19/2014   Pain management 06/18/2014   Chronic pancreatitis ([place]) 06/18/2014   Intractable migraine without aura 01/25/2014   Acute pancreatitis 09/25/2013   Leucocytosis 09/25/2013   Microcytic anemia 09/25/2013   Asthma 09/25/2013   Type 2 diabetes mellitus without complication, with long-term current use  of insulin (HCC) 09/25/2013   Hyponatremia 09/25/2013    Past Medical History:  Diagnosis Date   Asthma    Bursitis    Chronic sinusitis    DDD (degenerative disc disease), cervical    DDD (degenerative disc disease), lumbar    Diabetes mellitus    Type 1     Fibromyalgia    Hyperlipidemia    IBS (irritable bowel syndrome)    Migraine without aura, with intractable migraine, so stated, without mention of status migrainosus 01/25/2014   Migraines    Osteoarthritis of both knees 10/24/2016   moderate   Pancreatitis chronic    Pelvic floor dysfunction 10/24/2016   Sjogren's syndrome (HCC)    Vulvar vestibulitis     Family History  Problem Relation Age of Onset   Asthma Mother    Pancreatic cancer Mother    High Cholesterol Father    Macular degeneration Father    Migraines Sister    Endometriosis Sister    Asthma Brother    GER disease Brother    Pancreatitis Paternal Grandfather    Pancreatic cancer Other    Pancreatitis Maternal Uncle    Past Surgical History:  Procedure Laterality Date   ABDOMINAL HYSTERECTOMY  05/27/2020   [facility] - per patient   BREAST REDUCTION SURGERY  2010   DILATION AND CURETTAGE OF UTERUS  2007   ERCP     Removed stone from Bile duct   ERCP     with MRI   PANCREAS SURGERY  1997   whiple  1997   whipple's     Social History   Social History Narrative   Not on file   Immunization History  Administered Date(s) Administered   Influenza Split 09/11/2010, 11/30/2013, 09/27/2014, 10/08/2018   Influenza,inj,Quad PF,6+ Mos 09/01/2012, 12/06/2017, 09/29/2018   Influenza-Unspecified 11/10/1998, 10/01/2006, 10/11/2008, 09/02/2014   Pneumococcal Polysaccharide-23 12/05/2010   Tdap 11/30/2004     Objective: Vital Signs: BP 109/73 (BP Location: Left Arm, Patient Position: Sitting, Cuff Size: Small)   Pulse 85   Resp 12   Ht 5\' 4"  (1.626 m)   Wt 149 lb 9.6 oz (67.9 kg)   LMP 04/03/2020   BMI 25.68 kg/m    Physical Exam Vitals and nursing note reviewed.  Constitutional:      Appearance: She is well-developed.  HENT:     Head: Normocephalic and atraumatic.  Eyes:     Conjunctiva/sclera: Conjunctivae normal.  Cardiovascular:     Rate and Rhythm: Normal rate and regular rhythm.     Heart sounds:  Normal heart sounds.  Pulmonary:     Effort: Pulmonary effort is normal.     Breath sounds: Normal breath sounds.  Abdominal:     General: Bowel sounds are normal.     Palpations: Abdomen is soft.  Musculoskeletal:     Cervical back: Normal range of motion.  Lymphadenopathy:     Cervical: No cervical adenopathy.  Skin:    General: Skin is warm and dry.     Capillary Refill: Capillary refill takes less than 2 seconds.  Neurological:     Mental Status: She is alert and oriented to person, place, and time.  Psychiatric:        Behavior: Behavior normal.     Musculoskeletal Exam: C-spine thoracic and lumbar spine were in good range of motion.  She was able to reach the floor with the palm of her hands.  Shoulder joints, elbow joints, wrist joints, MCPs PIPs and DIPs with good  range of motion with no synovitis.  Hip joints, knee joints, ankles, MTPs and PIPs with good range of motion with no synovitis.  She has some tenderness on palpation over left trochanteric bursa.  CDAI Exam: CDAI Score: -- Patient Global: --; Provider Global: -- Swollen: --; Tender: -- Joint Exam 11/09/2021   No joint exam has been documented for this visit   There is currently no information documented on the homunculus. Go to the Rheumatology activity and complete the homunculus joint exam.  Investigation: No additional findings.  Imaging: No results found.  Recent Labs: Lab Results  Component Value Date   WBC 14.6 (H) 04/08/2020   HGB 10.0 (L) 04/08/2020   PLT 513 (H) 04/08/2020   NA 134 (L) 04/08/2020   K 3.2 (L) 04/08/2020   CL 98 04/08/2020   CO2 25 04/08/2020   GLUCOSE 130 (H) 04/08/2020   BUN 13 04/08/2020   CREATININE 0.61 04/08/2020   BILITOT 0.3 04/08/2020   ALKPHOS 74 04/08/2020   AST 17 04/08/2020   ALT 17 04/08/2020   PROT 7.8 04/08/2020   ALBUMIN 3.9 04/08/2020   CALCIUM 8.9 04/08/2020   GFRAA >60 04/08/2020    Speciality Comments: No specialty comments  available.  Procedures:  No procedures performed Allergies: Ciprofloxacin hcl, Nortriptyline hcl, Amitriptyline, Compazine [prochlorperazine maleate], Cymbalta [duloxetine hcl], Dexilant [dexlansoprazole], Doxycycline, Flagyl [metronidazole hcl], Gabapentin, Penicillins, Penicillins cross reactors, Septra [sulfamethoxazole-trimethoprim], Tizanidine, Topamax [topiramate], Toradol [ketorolac tromethamine], Zofran [ondansetron hcl], Amoxicillin, Breo ellipta [fluticasone furoate-vilanterol], Diflucan [fluconazole], Inapsine [droperidol], Ketamine, Levofloxacin, Lyrica [pregabalin], Phenergan [promethazine hcl], Phenothiazines, Primaxin [imipenem], Prozac [fluoxetine hcl], and Reglan [metoclopramide hcl]   Assessment / Plan:     Visit Diagnoses: DDD (degenerative disc disease), cervical-she continues to have some stiffness in her trapezius region.  She states that she has been going for physical therapy and getting dry needling which has been helpful.  DDD (degenerative disc disease), lumbar-she has had relief from dry needling in the lower back.  Trochanteric bursitis of left hip-stretching exercises were emphasized.  Primary osteoarthritis of both knees-she has been having pain and discomfort in her knee joints off and on.  No warmth swelling or effusion was noted.  Sicca complex (HCC) - Negative ANA, positive SSB: She continues to have dry mouth and dry eye symptoms.  Over-the-counter products were discussed.  According to the patient symptoms are manageable with over-the-counter products.  Fibromyalgia-she has frequent fibromyalgia flares.  She has been going to physical therapy for the last 7 years which has been helpful.  Need for regular exercise was emphasized.    Primary insomnia-she has chronic insomnia.  Other fatigue-due to fibromyalgia and insomnia.  History of pancreatitis - Hereditary, [facility] up at [facility].  Patient states she had a bout of pancreatitis about 2 weeks ago and she is  gradually recovering from it.  Other medical problems are listed as follows:  History of diabetes mellitus  Fatty liver  Pelvic floor dysfunction  History of migraine  History of IBS  Orders: No orders of the defined types were placed in this encounter.  No orders of the defined types were placed in this encounter.   Follow-Up Instructions: Return in about 6 months (around 05/10/2022) for sicca, OA.   Bo Merino, MD  Note - This record has been created using Editor, commissioning.  Chart creation errors have been sought, but may not always  have been located. Such creation errors do not reflect on  the standard of medical care.

## 2021-10-31 ENCOUNTER — Ambulatory Visit: Payer: Managed Care, Other (non HMO) | Admitting: Rheumatology

## 2021-10-31 DIAGNOSIS — Z8669 Personal history of other diseases of the nervous system and sense organs: Secondary | ICD-10-CM

## 2021-10-31 DIAGNOSIS — M797 Fibromyalgia: Secondary | ICD-10-CM

## 2021-10-31 DIAGNOSIS — R5383 Other fatigue: Secondary | ICD-10-CM

## 2021-10-31 DIAGNOSIS — M7062 Trochanteric bursitis, left hip: Secondary | ICD-10-CM

## 2021-10-31 DIAGNOSIS — K760 Fatty (change of) liver, not elsewhere classified: Secondary | ICD-10-CM

## 2021-10-31 DIAGNOSIS — M546 Pain in thoracic spine: Secondary | ICD-10-CM

## 2021-10-31 DIAGNOSIS — M3509 Sicca syndrome with other organ involvement: Secondary | ICD-10-CM

## 2021-10-31 DIAGNOSIS — M5030 Other cervical disc degeneration, unspecified cervical region: Secondary | ICD-10-CM

## 2021-10-31 DIAGNOSIS — M170 Bilateral primary osteoarthritis of knee: Secondary | ICD-10-CM

## 2021-10-31 DIAGNOSIS — M5136 Other intervertebral disc degeneration, lumbar region: Secondary | ICD-10-CM

## 2021-10-31 DIAGNOSIS — F5101 Primary insomnia: Secondary | ICD-10-CM

## 2021-10-31 DIAGNOSIS — Z8719 Personal history of other diseases of the digestive system: Secondary | ICD-10-CM

## 2021-10-31 DIAGNOSIS — M6289 Other specified disorders of muscle: Secondary | ICD-10-CM

## 2021-10-31 DIAGNOSIS — Z8639 Personal history of other endocrine, nutritional and metabolic disease: Secondary | ICD-10-CM

## 2021-11-01 ENCOUNTER — Other Ambulatory Visit: Payer: Self-pay | Admitting: Adult Health

## 2021-11-07 ENCOUNTER — Other Ambulatory Visit: Payer: Self-pay

## 2021-11-07 ENCOUNTER — Ambulatory Visit (INDEPENDENT_AMBULATORY_CARE_PROVIDER_SITE_OTHER): Payer: 59 | Admitting: Addiction (Substance Use Disorder)

## 2021-11-07 DIAGNOSIS — F411 Generalized anxiety disorder: Secondary | ICD-10-CM

## 2021-11-07 NOTE — Progress Notes (Signed)
[facility] Counselor/Therapist Progress Note  Patient ID: Heather Wagner, MRN: [id number],    Date: 11/08/2021  Time Spent:  Treatment Type: Individual Therapy  Reported Symptoms: let down, disappointed, sad.  Mental Status Exam:  Appearance:   Neat     Behavior:  Sharing  Motor:  Normal  Speech/Language:   Clear and Coherent  Affect:  Appropriate  Mood:  labile and sad  Thought process:  normal  Thought content:    WNL  Sensory/Perceptual disturbances:    WNL  Orientation:  x4  Attention:  Good  Concentration:  Good  Memory:  WNL  Fund of knowledge:   Good  Insight:    Good  Judgment:   Good  Impulse Control:  Good   Risk Assessment: Danger to Self:  No Self-injurious Behavior: No Danger to Others: No Duty to Warn:no Physical Aggression / Violence:No  Access to Firearms a concern: No  Gang Involvement:No   Subjective: Client reported struggling with grief lately as her pancreatitis has been having a flare and it caused her to have to pull back from all her social things. Client feeling let down and sad about having to miss a few christmas/holiday get together with her friends from her old church that she hasn't seen since the split in the church. Client processed more of their grief around the changes in their church family and how difficult it was since [place]. Therapist used MI & grief therapy to validate client's disappointment and support her as she processed her griefs around this. Therapist assessed for stability and client denied SI/HI/AVH and reported no panic attacks.  Interventions: Cognitive Behavioral Therapy, Motivational Interviewing, and Grief Therapy  Diagnosis:   ICD-10-CM   1. Generalized anxiety disorder  F41.1       Plan of Care:  Client is to return to therapy with therapist every 1-2 weeks as needed to process pain/grief/anxiety in a safe space, to be re-evaluated in 3 months.   Client is to consider seeing a medication provider to explore possible need for medication to regulate her mood swings and decrease risky behaviors that go against her values. Client is to practice mindfulness AEB daily meditation and body scans or as needed when flooded by emotion/pain or feeling numb.  Client is to learn/practice DBT wise mind & radical acceptance AEB understanding they don't have to live in extremes and can feel both happy and sad emotions, and good and bad memories and allowing themselves to do so.  Client is to practice self-compassion AEB being gentle with themselves, utilizing self-care techniques daily or as needed when grieving something in the moment.  Client is to process grief/pain of their loss in a somatic body-felt sense way: ie using mindfulness, brainspotting, or trauma release as a method for releasing body pain/tension caused by grief. Client to engage in positive self talk and challenging negative internal ruminations and self talk causing client to be overly anxious and worried using CBT, on daily practice. Client to engage in mindfulness: ie body scans each eveneing to help process and discharge emotional distress & recognize emotions. Client to utilize BSP (brainspotting) with therapist to help client regulate their anxiety in a somatic- felt body sense way: (ie by working to reduce muscle tension, ruminations, increased heart rate, constant worrying and feeling "hyper" ) by decreasing anxiety by 33% in the next 6 months.  Client to understand how to succeed in fighting back worry  and embrace life's uncertainty.  Pauline Good, LCSW, LCAS, CCTP, CCS, CCTP

## 2021-11-09 ENCOUNTER — Encounter: Payer: Self-pay | Admitting: Rheumatology

## 2021-11-09 ENCOUNTER — Ambulatory Visit (INDEPENDENT_AMBULATORY_CARE_PROVIDER_SITE_OTHER): Payer: Managed Care, Other (non HMO) | Admitting: Rheumatology

## 2021-11-09 ENCOUNTER — Other Ambulatory Visit: Payer: Self-pay

## 2021-11-09 VITALS — BP 109/73 | HR 85 | Resp 12 | Ht 64.0 in | Wt 149.6 lb

## 2021-11-09 DIAGNOSIS — M51369 Other intervertebral disc degeneration, lumbar region without mention of lumbar back pain or lower extremity pain: Secondary | ICD-10-CM

## 2021-11-09 DIAGNOSIS — M7062 Trochanteric bursitis, left hip: Secondary | ICD-10-CM

## 2021-11-09 DIAGNOSIS — F5101 Primary insomnia: Secondary | ICD-10-CM

## 2021-11-09 DIAGNOSIS — Z8719 Personal history of other diseases of the digestive system: Secondary | ICD-10-CM

## 2021-11-09 DIAGNOSIS — R5383 Other fatigue: Secondary | ICD-10-CM

## 2021-11-09 DIAGNOSIS — M3500 Sicca syndrome, unspecified: Secondary | ICD-10-CM

## 2021-11-09 DIAGNOSIS — M170 Bilateral primary osteoarthritis of knee: Secondary | ICD-10-CM | POA: Diagnosis not present

## 2021-11-09 DIAGNOSIS — M5136 Other intervertebral disc degeneration, lumbar region: Secondary | ICD-10-CM | POA: Diagnosis not present

## 2021-11-09 DIAGNOSIS — Z8669 Personal history of other diseases of the nervous system and sense organs: Secondary | ICD-10-CM

## 2021-11-09 DIAGNOSIS — M797 Fibromyalgia: Secondary | ICD-10-CM

## 2021-11-09 DIAGNOSIS — M6289 Other specified disorders of muscle: Secondary | ICD-10-CM

## 2021-11-09 DIAGNOSIS — K760 Fatty (change of) liver, not elsewhere classified: Secondary | ICD-10-CM

## 2021-11-09 DIAGNOSIS — M5030 Other cervical disc degeneration, unspecified cervical region: Secondary | ICD-10-CM

## 2021-11-09 DIAGNOSIS — Z8639 Personal history of other endocrine, nutritional and metabolic disease: Secondary | ICD-10-CM

## 2021-11-23 ENCOUNTER — Telehealth: Payer: Self-pay | Admitting: *Deleted

## 2021-11-23 NOTE — Telephone Encounter (Signed)
Labs received from:[facility] on:11/20/2021  Reviewed by:Dr. Pollyann Savoy  Labs drawn: CBC, CMP, Lipid pael  Results:Hgb 10.5  Hct. 32.7  MCV 74.5  MCH 24.0  RDW 16.0  PLT 444  LY # 3.30  Glucose 50  Creat. 0.56  LDL Chol. Calc 106  We do not prescribe patient any medication.

## 2022-04-25 NOTE — Progress Notes (Signed)
Office Visit Note  Patient: Heather Wagner             Date of Birth: 1976/08/05           MRN: [id number]             PCP: Maurice Small, MD Referring: Maurice Small, MD Visit Date: 05/08/2022 Occupation: @GUAROCC @  Subjective:  Thoracic spine pain   History of Present Illness: Heather Wagner is a 46 y.o. female with history of fibromyalgia, DDD, and osteoarthritis.  Patient continues to go to physical therapy twice a week for management of fibromyalgia.  On Tuesday she typically has massage therapy as well as ultrasound and on Fridays she typically does dry needling, cupping, and stretching/strengthening.  She continues to find benefit with physical therapy.  She states that since her last office visit she has been experiencing increased midline thoracic pain.  She states she previously was having more so trapezius muscle tension and tenderness but lately has had more midline spinal pain which has concerned her.  She would like an updated x-ray of the thoracic spine to compare to her previous x-ray to ensure there has been no radiographic progression.  She describes the midline discomfort as an aching sensation as well as a soreness.  Her pain is typically about a 5 out of 10.  She has been using magnesium foam, Biofreeze spray, and topical CBD PRN.  She has been trying to walk 40 minutes daily for exercise and has been using a foam roller at home as well as cupping.  She has had less frequent episodes of costochondritis.   Activities of Daily Living:  Patient reports morning stiffness for 45 minutes.   Patient Reports nocturnal pain.  Difficulty dressing/grooming: Denies Difficulty climbing stairs: Denies Difficulty getting out of chair: Denies Difficulty using hands for taps, buttons, cutlery, and/or writing: Reports  Review of Systems  Constitutional:  Negative for fatigue.  HENT:  Positive for mouth dryness. Negative for mouth sores and nose dryness.   Eyes:  Positive  for dryness. Negative for pain and visual disturbance.  Respiratory:  Negative for cough, hemoptysis, shortness of breath and difficulty breathing.   Cardiovascular:  Negative for chest pain, palpitations, hypertension and swelling in legs/feet.  Gastrointestinal:  Positive for constipation and diarrhea. Negative for blood in stool.  Endocrine: Positive for cold intolerance and heat intolerance. Negative for increased urination.  Genitourinary:  Negative for difficulty urinating and painful urination.  Musculoskeletal:  Positive for joint pain, joint pain, morning stiffness and muscle tenderness. Negative for joint swelling, myalgias, muscle weakness and myalgias.  Skin:  Negative for color change, pallor, rash, hair loss, nodules/bumps, skin tightness, ulcers and sensitivity to sunlight.  Allergic/Immunologic: Positive for susceptible to infections.  Neurological:  Negative for dizziness, numbness, headaches and weakness.  Hematological:  Positive for bruising/bleeding tendency. Negative for swollen glands.  Psychiatric/Behavioral:  Positive for sleep disturbance. Negative for depressed mood. The patient is not nervous/anxious.    PMFS History:  Patient Active Problem List   Diagnosis Date Noted   Pulmonary nodules/lesions, multiple 05/11/2021   Intractable pain    Acute on chronic pancreatitis (HCC) 10/28/2017   DDD (degenerative disc disease), cervical 07/12/2017   DDD (degenerative disc disease), lumbar 07/12/2017   Primary insomnia 05/30/2017   Other fatigue 05/30/2017   ANA positive 04/19/2017   History of chronic sinusitis 04/19/2017   Fibromyalgia 10/24/2016   Osteoarthritis of both knees 10/24/2016   IBS (irritable bowel syndrome) 10/24/2016   Migraines 10/24/2016  Chronic sinusitis 10/24/2016   Pelvic floor dysfunction 10/24/2016   Vulvar vestibulitis 10/24/2016   Dehydration 07/11/2016   Fatty liver 07/11/2016   Elevated LFTs 07/11/2016   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   Abdominal pain 01/05/2016   Pancreatitis, acute    CAP (community acquired pneumonia) 06/19/2014   Pain management 06/18/2014   Chronic pancreatitis (HCC) 06/18/2014   Intractable migraine without aura 01/25/2014   Acute pancreatitis 09/25/2013   Leucocytosis 09/25/2013   Microcytic anemia 09/25/2013   Asthma 09/25/2013   Type 2 diabetes mellitus without complication, with long-term current use of insulin (HCC) 09/25/2013   Hyponatremia 09/25/2013    Past Medical History:  Diagnosis Date   Asthma    Bursitis    Chronic sinusitis    DDD (degenerative disc disease), cervical    DDD (degenerative disc disease), lumbar    Diabetes mellitus    Type 1    Fibromyalgia    Hyperlipidemia    IBS (irritable bowel syndrome)    Migraine without aura, with intractable migraine, so stated, without mention of status migrainosus 01/25/2014   Migraines    Osteoarthritis of both knees 10/24/2016   moderate   Pancreatitis chronic    Pelvic floor dysfunction 10/24/2016   Sjogren's syndrome (HCC)    Vulvar vestibulitis     Family History  Problem Relation Age of Onset   Asthma Mother    Pancreatic cancer Mother    High Cholesterol Father    Macular degeneration Father    Migraines Sister    Endometriosis Sister    Asthma Brother    GER disease Brother    Pancreatitis Paternal Grandfather    Pancreatic cancer Other    Pancreatitis Maternal Uncle    Past Surgical History:  Procedure Laterality Date   ABDOMINAL HYSTERECTOMY  05/27/2020   [facility] - per patient   BREAST REDUCTION SURGERY  2010   DILATION AND CURETTAGE OF UTERUS  2007   ERCP     Removed stone from Bile duct   ERCP     with MRI   PANCREAS SURGERY  1997   whiple  1997   whipple's     Social History   Social History Narrative   Not on file   Immunization History  Administered Date(s) Administered   Influenza Split 09/11/2010, 11/30/2013, 09/27/2014, 10/08/2018   Influenza,inj,Quad PF,6+ Mos  09/01/2012, 12/06/2017, 09/29/2018   Influenza-Unspecified 11/10/1998, 10/01/2006, 10/11/2008, 09/02/2014   Pneumococcal Polysaccharide-23 12/05/2010   Tdap 11/30/2004     Objective: Vital Signs: BP 127/79 (BP Location: Left Arm, Patient Position: Sitting, Cuff Size: Normal)   Pulse 88   Resp 15   Ht  (1.626 m)   Wt 151 lb (68.5 kg)   LMP 04/03/2020   BMI 25.92 kg/m    Physical Exam Vitals and nursing note reviewed.  Constitutional:      Appearance: She is well-developed.  HENT:     Head: Normocephalic and atraumatic.  Eyes:     Conjunctiva/sclera: Conjunctivae normal.  Cardiovascular:     Rate and Rhythm: Normal rate and regular rhythm.     Heart sounds: Normal heart sounds.  Pulmonary:     Effort: Pulmonary effort is normal.     Breath sounds: Normal breath sounds.  Abdominal:     General: Bowel sounds are normal.     Palpations: Abdomen is soft.  Musculoskeletal:     Cervical back: Normal range of motion.  Skin:    General: Skin is warm and  dry.     Capillary Refill: Capillary refill takes less than 2 seconds.  Neurological:     Mental Status: She is alert and oriented to person, place, and time.  Psychiatric:        Behavior: Behavior normal.     Musculoskeletal Exam: Generalized hyperalgesia and positive tender points on exam.  C-spine has good range of motion with lateral rotation.  Midline spinal tenderness in the thoracic region noted.  Trapezius muscle tension and tenderness bilaterally.  Shoulder joints, elbow joints, wrist joints, MCPs, PIPs, DIPs have good range of motion with no synovitis.  She was able to make a complete fist bilaterally.  Hip joints have good range of motion with no groin pain.  Tenderness ovation over bilateral trochanteric bursa.  Knee joints have good range of motion with no warmth or effusion.  Ankle joints have good range of motion with no inflammation.   CDAI Exam: CDAI Score: -- Patient Global: --; Provider Global:  -- Swollen: --; Tender: -- Joint Exam 05/08/2022   No joint exam has been documented for this visit   There is currently no information documented on the homunculus. Go to the Rheumatology activity and complete the homunculus joint exam.  Investigation: No additional findings.  Imaging: No results found.  Recent Labs: Lab Results  Component Value Date   WBC 14.6 (H) 04/08/2020   HGB 10.0 (L) 04/08/2020   PLT 513 (H) 04/08/2020   NA 134 (L) 04/08/2020   K 3.2 (L) 04/08/2020   CL 98 04/08/2020   CO2 25 04/08/2020   GLUCOSE 130 (H) 04/08/2020   BUN 13 04/08/2020   CREATININE 0.61 04/08/2020   BILITOT 0.3 04/08/2020   ALKPHOS 74 04/08/2020   AST 17 04/08/2020   ALT 17 04/08/2020   PROT 7.8 04/08/2020   ALBUMIN 3.9 04/08/2020   CALCIUM 8.9 04/08/2020   GFRAA >60 04/08/2020    Speciality Comments: No specialty comments available.  Procedures:  No procedures performed Allergies: Ciprofloxacin hcl, Nortriptyline hcl, Amitriptyline, Compazine [prochlorperazine maleate], Cymbalta [duloxetine hcl], Dexilant [dexlansoprazole], Doxycycline, Flagyl [metronidazole hcl], Gabapentin, Penicillins, Penicillins cross reactors, Septra [sulfamethoxazole-trimethoprim], Tizanidine, Topamax [topiramate], Toradol [ketorolac tromethamine], Xanax [alprazolam], Zofran [ondansetron hcl], Amoxicillin, Breo ellipta [fluticasone furoate-vilanterol], Diflucan [fluconazole], Inapsine [droperidol], Ketamine, Levofloxacin, Lyrica [pregabalin], Phenergan [promethazine hcl], Phenothiazines, Primaxin [imipenem], Prozac [fluoxetine hcl], and Reglan [metoclopramide hcl]   Assessment / Plan:     Visit Diagnoses: Fibromyalgia - She has generalized hyperalgesia and positive tender points on examination.  She has been going to physical therapy since 2015 which has been helping to manage her symptoms of fibromyalgia.  She continues to have some generalized hyperalgesia and positive tender points on examination today.   She has ongoing trapezius muscle tension and tenderness bilaterally.  She has benefited from massage, dry needling, cupping, ultrasound, and her stretching regimen.  She has also been using topical agents including Biofreeze, CBD, and magnesium foam as needed.  She has been trying to walk 40 minutes daily for exercise.  Discussed the importance of regular exercise and good sleep hygiene.  She plans on continuing physical therapy twice weekly. She will follow up in 6 months.   Other fatigue: Chronic but stable.  Patient has been trying to walk 40 minutes daily for exercise.  Primary insomnia: Discussed importance of good sleep hygiene.  DDD (degenerative disc disease), cervical: She has good range of motion of the C-spine on examination today.  No symptoms of radiculopathy.  She has ongoing trapezius muscle tension and tenderness bilaterally.  Massage and ultrasound at physical therapy alleviates her symptoms.  Pain in thoracic spine -She presents today with increased thoracic spinal pain.  No recent injury or change in activity.  She has ongoing trapezius muscle tension and tenderness bilaterally has started to have more midline spinal tenderness and discomfort.  She describes the pain as an aching sensation as well as soreness.  Her pain level ranges from a 4 out of 10 to a 7 out of 10. No symptoms of radiculopathy. She continues to go to physical therapy twice a week.  She is concerned about possible progression of arthritis or further joint damage.  X-rays of the thoracic spine were obtained on 05/02/2021 which were consistent with early degenerative changes.  Lateral osteophytes and mild spondylosis were noted.  X-rays of the thoracic spine were updated today to assess for radiographic progression as requested.  She plans on continuing to go to physical therapy twice a week and will use topical agents as needed for symptomatic relief.  Offered a referral to a spine specialist but she would like to hold  off at this time.  She will notify us if she develops any new or worsening symptoms.  Plan: XR Thoracic Spine 2 View  DDD (degenerative disc disease), lumbar:  Her lower back discomfort is managed with dry needling.  No midline spinal tenderness in the lumbar region at this time.  No symptoms of radiculopathy.  Trochanteric bursitis of left hip: Improved.  She continues to work with physical therapy.  On examination her left hip range of motion has improved.  She has no groin pain currently.  Mild tenderness palpation over the left trochanteric bursa was noted.  Primary osteoarthritis of both knees: She has good range of motion of both knee joints on examination today.  No warmth or effusion was noted.  Sicca complex (HCC) - Negative ANA, positive SSB. Managed with OTC products.   Other medical conditions are listed as follows:   History of pancreatitis: Hereditary, followed up at [facility]  History of diabetes mellitus  Fatty liver  Pelvic floor dysfunction  History of migraine  History of IBS    Orders: Orders Placed This Encounter  Procedures   XR Thoracic Spine 2 View   No orders of the defined types were placed in this encounter.    Follow-Up Instructions: Return in 6 months (on 11/07/2022) for Fibromyalgia, DDD, Osteoarthritis.   Gearldine Bienenstockaylor M Kattaleya Alia, PA-C  Note - This record has been created using Dragon software.  Chart creation errors have been sought, but may not always  have been located. Such creation errors do not reflect on  the standard of medical care.

## 2022-05-08 ENCOUNTER — Ambulatory Visit (INDEPENDENT_AMBULATORY_CARE_PROVIDER_SITE_OTHER): Payer: Managed Care, Other (non HMO)

## 2022-05-08 ENCOUNTER — Ambulatory Visit (INDEPENDENT_AMBULATORY_CARE_PROVIDER_SITE_OTHER): Payer: Managed Care, Other (non HMO) | Admitting: Physician Assistant

## 2022-05-08 ENCOUNTER — Encounter: Payer: Self-pay | Admitting: Physician Assistant

## 2022-05-08 VITALS — BP 127/79 | HR 88 | Resp 15 | Ht 64.0 in | Wt 151.0 lb

## 2022-05-08 DIAGNOSIS — M3500 Sicca syndrome, unspecified: Secondary | ICD-10-CM

## 2022-05-08 DIAGNOSIS — M6289 Other specified disorders of muscle: Secondary | ICD-10-CM

## 2022-05-08 DIAGNOSIS — M546 Pain in thoracic spine: Secondary | ICD-10-CM | POA: Diagnosis not present

## 2022-05-08 DIAGNOSIS — Z8719 Personal history of other diseases of the digestive system: Secondary | ICD-10-CM

## 2022-05-08 DIAGNOSIS — M5030 Other cervical disc degeneration, unspecified cervical region: Secondary | ICD-10-CM | POA: Diagnosis not present

## 2022-05-08 DIAGNOSIS — K760 Fatty (change of) liver, not elsewhere classified: Secondary | ICD-10-CM

## 2022-05-08 DIAGNOSIS — M170 Bilateral primary osteoarthritis of knee: Secondary | ICD-10-CM

## 2022-05-08 DIAGNOSIS — R5383 Other fatigue: Secondary | ICD-10-CM

## 2022-05-08 DIAGNOSIS — Z8669 Personal history of other diseases of the nervous system and sense organs: Secondary | ICD-10-CM

## 2022-05-08 DIAGNOSIS — M797 Fibromyalgia: Secondary | ICD-10-CM

## 2022-05-08 DIAGNOSIS — M7062 Trochanteric bursitis, left hip: Secondary | ICD-10-CM

## 2022-05-08 DIAGNOSIS — M5136 Other intervertebral disc degeneration, lumbar region: Secondary | ICD-10-CM

## 2022-05-08 DIAGNOSIS — F5101 Primary insomnia: Secondary | ICD-10-CM

## 2022-05-08 DIAGNOSIS — Z8639 Personal history of other endocrine, nutritional and metabolic disease: Secondary | ICD-10-CM

## 2022-05-09 NOTE — Progress Notes (Signed)
X-ray findings of the thoracic spine are consistent with multilevel spondylosis with no radiographic progression. Please notify the patient.

## 2022-05-26 ENCOUNTER — Other Ambulatory Visit: Payer: Self-pay | Admitting: Emergency Medicine

## 2022-06-21 ENCOUNTER — Other Ambulatory Visit: Payer: Self-pay | Admitting: Emergency Medicine

## 2022-07-16 ENCOUNTER — Other Ambulatory Visit: Payer: Self-pay | Admitting: Emergency Medicine

## 2022-08-13 ENCOUNTER — Other Ambulatory Visit: Payer: Self-pay | Admitting: Emergency Medicine

## 2022-09-12 ENCOUNTER — Other Ambulatory Visit: Payer: Self-pay | Admitting: Emergency Medicine

## 2022-10-18 ENCOUNTER — Telehealth: Payer: Self-pay | Admitting: Emergency Medicine

## 2022-10-18 ENCOUNTER — Other Ambulatory Visit: Payer: Self-pay | Admitting: Emergency Medicine

## 2022-10-18 NOTE — Telephone Encounter (Signed)
Called and spoke with pt stating to her that we could get a  new Rx for the generic Symbicort sent to the pharmacy for her but stated to her that we would need her to be seen for an appt as she was overdue for an appt. Pt verbalized understanding and has been scheduled for an appt with RB. Nothing further needed.

## 2022-11-01 NOTE — Progress Notes (Signed)
Office Visit Note  Patient: Heather Wagner             Date of Birth: [birth year]           MRN: [id number]             PCP: Maurice Small, MD Referring: Maurice Small, MD Visit Date: 11/13/2022 Occupation: @GUAROCC @  Subjective:  Generalized pain  History of Present Illness: Heather Wagner is a 46 y.o. female with history of osteoarthritis, degenerative disc disease and fibromyalgia syndrome.  She continues to have pain and discomfort in all of her joints and muscles.  She continues to have pain and discomfort in bilateral trapezius region and bilateral trochanteric bursa.  She has been going to physical therapy and massage therapy on a regular basis.  She states she had a bout of shingles November 2023.  She had 2 episodes of pancreatitis in the last 3 months.  She states she has gained some weight and has not been able to walk as much.  She has been experiencing generalized pain and discomfort.  She has been in a recliner.  She states sleeping on the recliner prevents her from sleeping on her stomach.  She has not had so much thoracic pain since she has been sleeping in the recliner.  Although she experiences more discomfort in her knees and her hips.  She continues to have dry mouth and dry eyes.  Activities of Daily Living:  Patient reports morning stiffness for 2 hours.   Patient Reports nocturnal pain.  Difficulty dressing/grooming: Denies Difficulty climbing stairs: Denies Difficulty getting out of chair: Denies Difficulty using hands for taps, buttons, cutlery, and/or writing: Denies  Review of Systems  Constitutional:  Positive for fatigue.  HENT:  Positive for mouth dryness and nose dryness. Negative for mouth sores.   Eyes:  Positive for dryness.  Respiratory:  Negative for shortness of breath.   Cardiovascular:  Negative for chest pain and palpitations.  Gastrointestinal:  Positive for constipation. Negative for diarrhea.  Endocrine: Negative for increased  urination.  Genitourinary:  Negative for involuntary urination.  Musculoskeletal:  Positive for joint pain, joint pain, myalgias, morning stiffness, muscle tenderness and myalgias. Negative for gait problem, joint swelling and muscle weakness.  Skin:  Negative for color change, rash, hair loss and sensitivity to sunlight.  Allergic/Immunologic: Negative for susceptible to infections.  Neurological:  Negative for dizziness and headaches.  Hematological:  Negative for swollen glands.  Psychiatric/Behavioral:  Positive for sleep disturbance. Negative for depressed mood. The patient is not nervous/anxious.     PMFS History:  Patient Active Problem List   Diagnosis Date Noted   Pulmonary nodules/lesions, multiple 05/11/2021   Intractable pain    Acute on chronic pancreatitis (HCC) 10/28/2017   DDD (degenerative disc disease), cervical 07/12/2017   DDD (degenerative disc disease), lumbar 07/12/2017   Primary insomnia 05/30/2017   Other fatigue 05/30/2017   ANA positive 04/19/2017   History of chronic sinusitis 04/19/2017   Fibromyalgia 10/24/2016   Osteoarthritis of both knees 10/24/2016   IBS (irritable bowel syndrome) 10/24/2016   Migraines 10/24/2016   Chronic sinusitis 10/24/2016   Pelvic floor dysfunction 10/24/2016   Vulvar vestibulitis 10/24/2016   Dehydration 07/11/2016   Fatty liver 07/11/2016   Elevated LFTs 07/11/2016   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   Abdominal pain 01/05/2016   Pancreatitis, acute    CAP (community acquired pneumonia) 06/19/2014   Pain management 06/18/2014   Chronic pancreatitis (HCC) 06/18/2014   Intractable migraine without  aura 01/25/2014   Acute pancreatitis 09/25/2013   Leucocytosis 09/25/2013   Microcytic anemia 09/25/2013   Asthma 09/25/2013   Type 2 diabetes mellitus without complication, with long-term current use of insulin (HCC) 09/25/2013   Hyponatremia 09/25/2013    Past Medical History:  Diagnosis Date   Asthma     Bursitis    Chronic sinusitis    DDD (degenerative disc disease), cervical    DDD (degenerative disc disease), lumbar    Diabetes mellitus    Type 1    Fibromyalgia    Hyperlipidemia    IBS (irritable bowel syndrome)    Migraine without aura, with intractable migraine, so stated, without mention of status migrainosus 01/25/2014   Migraines    Osteoarthritis of both knees 10/24/2016   moderate   Pancreatitis chronic    Pelvic floor dysfunction 10/24/2016   Sjogren's syndrome (HCC)    Vulvar vestibulitis     Family History  Problem Relation Age of Onset   Asthma Mother    Pancreatic cancer Mother    High Cholesterol Father    Macular degeneration Father    Migraines Sister    Endometriosis Sister    Asthma Brother    GER disease Brother    Pancreatitis Paternal Grandfather    Pancreatic cancer Other    Pancreatitis Maternal Uncle    Past Surgical History:  Procedure Laterality Date   ABDOMINAL HYSTERECTOMY  05/27/2020   [facility] - per patient   BREAST REDUCTION SURGERY  2010   DILATION AND CURETTAGE OF UTERUS  2007   ERCP     Removed stone from Bile duct   ERCP     with MRI   PANCREAS SURGERY  1997   whiple  1997   whipple's     Social History   Social History Narrative   Not on file   Immunization History  Administered Date(s) Administered   Influenza Split 09/11/2010, 11/30/2013, 09/27/2014, 10/08/2018   Influenza,inj,Quad PF,6+ Mos 09/01/2012, 12/06/2017, 09/29/2018   Influenza-Unspecified 11/10/1998, 10/01/2006, 10/11/2008, 09/02/2014   Pneumococcal Polysaccharide-23 12/05/2010   Tdap 11/30/2004     Objective: Vital Signs: BP 122/77 (BP Location: Left Arm, Patient Position: Sitting, Cuff Size: Normal)   Pulse 80   Resp 16   Ht 5\' 4"  (1.626 m)   Wt 159 lb 6.4 oz (72.3 kg)   LMP 04/03/2020   BMI 27.36 kg/m    Physical Exam Vitals and nursing note reviewed.  Constitutional:      Appearance: She is well-developed.  HENT:     Head: Normocephalic and  atraumatic.  Eyes:     Conjunctiva/sclera: Conjunctivae normal.  Cardiovascular:     Rate and Rhythm: Normal rate and regular rhythm.     Heart sounds: Normal heart sounds.  Pulmonary:     Effort: Pulmonary effort is normal.     Breath sounds: Normal breath sounds.  Abdominal:     General: Bowel sounds are normal.     Palpations: Abdomen is soft.  Musculoskeletal:     Cervical back: Normal range of motion.  Lymphadenopathy:     Cervical: No cervical adenopathy.  Skin:    General: Skin is warm and dry.     Capillary Refill: Capillary refill takes less than 2 seconds.  Neurological:     Mental Status: She is alert and oriented to person, place, and time.  Psychiatric:        Behavior: Behavior normal.      Musculoskeletal Exam: Cervical, thoracic and lumbar spine  were in good range of motion.  Shoulder joints, elbow joints, wrist joints, MCPs PIPs and DIPs Juengel range of motion with no synovitis.  Hip joints and knee joints in good range of motion without any warmth swelling or effusion.  There was no tenderness over ankles or MTPs.  CDAI Exam: CDAI Score: -- Patient Global: --; Provider Global: -- Swollen: --; Tender: -- Joint Exam 11/13/2022   No joint exam has been documented for this visit   There is currently no information documented on the homunculus. Go to the Rheumatology activity and complete the homunculus joint exam.  Investigation: No additional findings.  Imaging: No results found.  Recent Labs: Lab Results  Component Value Date   WBC 14.6 (H) 04/08/2020   HGB 10.0 (L) 04/08/2020   PLT 513 (H) 04/08/2020   NA 134 (L) 04/08/2020   K 3.2 (L) 04/08/2020   CL 98 04/08/2020   CO2 25 04/08/2020   GLUCOSE 130 (H) 04/08/2020   BUN 13 04/08/2020   CREATININE 0.61 04/08/2020   BILITOT 0.3 04/08/2020   ALKPHOS 74 04/08/2020   AST 17 04/08/2020   ALT 17 04/08/2020   PROT 7.8 04/08/2020   ALBUMIN 3.9 04/08/2020   CALCIUM 8.9 04/08/2020   GFRAA >60  04/08/2020    Speciality Comments: No specialty comments available.  Procedures:  No procedures performed Allergies: Ciprofloxacin hcl, Nortriptyline hcl, Amitriptyline, Compazine [prochlorperazine maleate], Cymbalta [duloxetine hcl], Dexilant [dexlansoprazole], Doxycycline, Flagyl [metronidazole hcl], Gabapentin, Penicillins, Penicillins cross reactors, Septra [sulfamethoxazole-trimethoprim], Tizanidine, Topamax [topiramate], Toradol [ketorolac tromethamine], Xanax [alprazolam], Zofran [ondansetron hcl], Amoxicillin, Breo ellipta [fluticasone furoate-vilanterol], Diflucan [fluconazole], Inapsine [droperidol], Ketamine, Levofloxacin, Lyrica [pregabalin], Phenergan [promethazine hcl], Phenothiazines, Primaxin [imipenem], Prozac [fluoxetine hcl], and Reglan [metoclopramide hcl]   Assessment / Plan:     Visit Diagnoses: Fibromyalgia-she continues to have generalized pain and discomfort.  She has positive tender points.  She states that she has been  having fibromyalgia flares.  For regular exercise was emphasized.  Patient states she is not able to exercise recently.  Other fatigue-she continues to have fatigue due to fibromyalgia and insomnia.  Primary insomnia-sleep hygiene was discussed.  DDD (degenerative disc disease), cervical-she had good range of motion of the cervical spine without discomfort or radiculopathy.  Pain in thoracic spine-she states that thoracic spine pain has improved since she has been sleeping on the recliner.  She states when she sleeps on the bed she tends to sleep on her stomach which makes her thoracic pain worse.  DDD (degenerative disc disease), lumbar-she continues to have lower back pain.  A handout on lumbar spine exercises was given.  Trochanteric bursitis of left hip-she is intermittent discomfort in the trochanteric region.  IT band stretches were advised.  Primary osteoarthritis of both knees-she denies any discomfort in her knees recently.  She states she  was having some discomfort in her left knee joint which improved after physical therapy.  Sicca complex (HCC) - Negative ANA, positive SSB.  She continues to have dry mouth and dry eye symptoms.  Over-the-counter products were discussed at length.  Other medical problems are listed as follows:  History of pancreatitis - Hereditary, followed up at [facility].  She states she had frequent bouts of pancreatitis recently.  Fatty liver  History of diabetes mellitus  History of migraine  History of IBS  Pelvic floor dysfunction  Orders: No orders of the defined types were placed in this encounter.  No orders of the defined types were placed in this encounter.  Follow-Up Instructions: Return in about 6 months (around 05/15/2023) for Osteoarthritis, FMS.   Pollyann SavoyShaili Arline Ketter, MD  Note - This record has been created using Animal nutritionistDragon software.  Chart creation errors have been sought, but may not always  have been located. Such creation errors do not reflect on  the standard of medical care.

## 2022-11-13 ENCOUNTER — Encounter: Payer: Self-pay | Admitting: Rheumatology

## 2022-11-13 ENCOUNTER — Ambulatory Visit: Payer: Managed Care, Other (non HMO) | Attending: Rheumatology | Admitting: Rheumatology

## 2022-11-13 VITALS — BP 122/77 | HR 80 | Resp 16 | Ht 64.0 in | Wt 159.4 lb

## 2022-11-13 DIAGNOSIS — M3500 Sicca syndrome, unspecified: Secondary | ICD-10-CM

## 2022-11-13 DIAGNOSIS — R5383 Other fatigue: Secondary | ICD-10-CM

## 2022-11-13 DIAGNOSIS — K760 Fatty (change of) liver, not elsewhere classified: Secondary | ICD-10-CM

## 2022-11-13 DIAGNOSIS — Z8639 Personal history of other endocrine, nutritional and metabolic disease: Secondary | ICD-10-CM

## 2022-11-13 DIAGNOSIS — M797 Fibromyalgia: Secondary | ICD-10-CM

## 2022-11-13 DIAGNOSIS — F5101 Primary insomnia: Secondary | ICD-10-CM

## 2022-11-13 DIAGNOSIS — Z8669 Personal history of other diseases of the nervous system and sense organs: Secondary | ICD-10-CM

## 2022-11-13 DIAGNOSIS — M546 Pain in thoracic spine: Secondary | ICD-10-CM

## 2022-11-13 DIAGNOSIS — M7062 Trochanteric bursitis, left hip: Secondary | ICD-10-CM

## 2022-11-13 DIAGNOSIS — Z8719 Personal history of other diseases of the digestive system: Secondary | ICD-10-CM

## 2022-11-13 DIAGNOSIS — M170 Bilateral primary osteoarthritis of knee: Secondary | ICD-10-CM

## 2022-11-13 DIAGNOSIS — M5136 Other intervertebral disc degeneration, lumbar region: Secondary | ICD-10-CM

## 2022-11-13 DIAGNOSIS — M5030 Other cervical disc degeneration, unspecified cervical region: Secondary | ICD-10-CM

## 2022-11-13 DIAGNOSIS — M6289 Other specified disorders of muscle: Secondary | ICD-10-CM

## 2022-11-13 NOTE — Patient Instructions (Signed)

## 2022-11-20 ENCOUNTER — Ambulatory Visit (INDEPENDENT_AMBULATORY_CARE_PROVIDER_SITE_OTHER): Payer: Managed Care, Other (non HMO) | Admitting: Emergency Medicine

## 2022-11-20 ENCOUNTER — Encounter: Payer: Self-pay | Admitting: Emergency Medicine

## 2022-11-20 VITALS — BP 122/78 | HR 69 | Temp 98.7°F | Ht 64.0 in | Wt 159.0 lb

## 2022-11-20 DIAGNOSIS — J4530 Mild persistent asthma, uncomplicated: Secondary | ICD-10-CM

## 2022-11-20 MED ORDER — BUDESONIDE-FORMOTEROL FUMARATE 160-4.5 MCG/ACT IN AERO: 2.0000 | INHALATION_SPRAY | Freq: Two times a day (BID) | RESPIRATORY_TRACT | 3 refills | Status: DC

## 2022-11-20 NOTE — Assessment & Plan Note (Signed)
Overall good control prescription and functional capacity.  Rare albuterol use, usually triggered by dust or cold air/warm air.  No flares, no albuterol use in several years.  She likely needs repeat pulmonary function testing soon, last was in 2017.  Plan to transition her over to generic budesonide/formoterol for insurance reasons.  If this is an adequately we will petition to get the name brand Symbicort.    I am glad that your asthma has been doing well. We will change your Symbicort over to the generic budesonide/formoterol 2 puffs twice a day.  Rinse and gargle after using. Keep albuterol available to use 2 puffs up to every 4 hours if needed for shortness of breath, chest tightness, wheezing.  Agree with nasal saline rinses as needed Agree with slowly and steadily increasing your exercise and conditioning, exertional tolerance Follow with Dr. Delton Coombes in 12 months or sooner if you have any problems.

## 2022-11-20 NOTE — Patient Instructions (Addendum)
I am glad that your asthma has been doing well. We will change your Symbicort over to the generic budesonide/formoterol 2 puffs twice a day.  Rinse and gargle after using. Keep albuterol available to use 2 puffs up to every 4 hours if needed for shortness of breath, chest tightness, wheezing.  Agree with nasal saline rinses as needed Agree with slowly and steadily increasing your exercise and conditioning, exertional tolerance Follow with Dr. Delton Coombes in 12 months or sooner if you have any problems.

## 2022-11-20 NOTE — Progress Notes (Signed)
   Subjective:    Patient ID: Heather Wagner, female    DOB: 1976-03-20, 46 y.o.   MRN: [id number]  HPI 46 year old never smoker with a history of diabetes type 1, IBS, migraines, Sjogren's syndrome.  We follow her for asthma and allergic rhinitis.  She had COVID-19 in 11/2020 she has been maintained on ICS/LABA, initially Dulera but then switched to Symbicort. Her control has been good on the Symbicort.  Good functional capacity, less SOB. She is walking daily. Humidity makes her breathing more difficult. Using albuterol rarely, 1-2x a week. No prednisone since she had COVID last Fall. Averages about once a year.   She uses [facility] for allergies, not on any antihistamines.   Pulmonary function testing 11/19/2016 reviewed by me, showed moderate obstruction with a positive bronchodilator response, normal lung volumes and a slightly decreased diffusion capacity.  CT scan of the chest 10/07/2020 reviewed by me, shows no infiltrates, a few scattered 2 to 3 mm pulmonary nodules peripherally of unclear significance.   ROV 11/20/2022 --follow-up visit for 46 year old woman with history of diabetes type 1, IBS, migraines, Sjogren's, fibromyalgia syndrome and mild persistent asthma, allergic rhinitis.  She has been managed on Symbicort.  Her insurance is going to stop covering this and prefers the generic budesonide/formoterol.  She is using albuterol approximately every 2 weeks. Triggers > air temp changes. Good exercise tolerance, she does exercise. No pred since I have seen her.    Review of Systems As per HPI       Objective:   Physical Exam  Vitals:   11/20/22 0834  BP: 122/78  Pulse: 69  Temp: 98.7 F (37.1 C)  TempSrc: Oral  SpO2: 98%  Weight: 159 lb (72.1 kg)  Height: 5\' 4"  (1.626 m)   Gen: Pleasant, well-nourished, in no distress,  normal affect  ENT: No lesions,  mouth clear,  oropharynx clear, no postnasal drip  Neck: No JVD, no stridor  Lungs: No use of accessory muscles,  no crackles or wheezing on normal respiration, no wheeze on forced expiration  Cardiovascular: RRR, heart sounds normal, no murmur or gallops, no peripheral edema  Musculoskeletal: No deformities, no cyanosis or clubbing  Neuro: alert, awake, non focal  Skin: Warm, no lesions or rash     Assessment & Plan:   Asthma Overall good control prescription and functional capacity.  Rare albuterol use, usually triggered by dust or cold air/warm air.  No flares, no albuterol use in several years.  She likely needs repeat pulmonary function testing soon, last was in 2017.  Plan to transition her over to generic budesonide/formoterol for insurance reasons.  If this is an adequately we will petition to get the name brand Symbicort.    I am glad that your asthma has been doing well. We will change your Symbicort over to the generic budesonide/formoterol 2 puffs twice a day.  Rinse and gargle after using. Keep albuterol available to use 2 puffs up to every 4 hours if needed for shortness of breath, chest tightness, wheezing.  Agree with nasal saline rinses as needed Agree with slowly and steadily increasing your exercise and conditioning, exertional tolerance Follow with Dr. 2018 in 12 months or sooner if you have any problems.    Delton Coombes, MD, PhD 11/20/2022, 8:50 AM [facility] [phone] or if no answer before 7:00PM call [phone] For any issues after 7:00PM please call eLink [phone]

## 2022-11-22 ENCOUNTER — Encounter: Payer: Self-pay | Admitting: Emergency Medicine

## 2022-11-22 ENCOUNTER — Other Ambulatory Visit: Payer: Self-pay | Admitting: Emergency Medicine

## 2022-11-23 MED ORDER — BUDESONIDE-FORMOTEROL FUMARATE 80-4.5 MCG/ACT IN AERO: 2.0000 | INHALATION_SPRAY | Freq: Two times a day (BID) | RESPIRATORY_TRACT | 12 refills | Status: DC

## 2023-05-14 ENCOUNTER — Ambulatory Visit: Payer: Managed Care, Other (non HMO) | Admitting: Physician Assistant

## 2023-06-12 NOTE — Progress Notes (Signed)
Office Visit Note  Patient: Heather Wagner             Date of Birth: [birth year]           MRN: [id number]             PCP: Ollen Bowl, MD Referring: No ref. provider found Visit Date: 06/14/2023 Occupation: @GUAROCC @  Subjective:  Left shoulder joint pain   History of Present Illness: Heather Wagner is a 47 y.o. female with history of fibromyalgia, osteoarthritis, and DDD.  Patient continues to have generalized myalgias and muscle tenderness due to fibromyalgia.  Patient states she has been under increased stress recently which she feels has contributed to increased pain, fatigue, and brain fog.  She states that she has also been unable to go to formal physical therapy since her physical therapist left the practice in March.  She has continued to go to integrative therapy on a weekly basis for massage therapy.  She is hoping to resume physical therapy since she is having increased discomfort in the left shoulder joint.  She denies any injury but states that the pain has been progressively worsening.  Patient continues to work on range of motion exercises but has had increased discomfort when doing so.  Patient requested x-rays of the left shoulder today for further evaluation.  Patient states she has also been experiencing symptoms of menopause including severe hot flashes occurring up to 30-40 times per day.  Patient previously had a hysterectomy and does not currently follow-up with a gynecologist.  She is hoping to establish care with a new gynecologist to have some of her hormones checked.   Activities of Daily Living:  Patient reports morning stiffness for 2 hours.   Patient Reports nocturnal pain.  Difficulty dressing/grooming: Denies Difficulty climbing stairs: Denies Difficulty getting out of chair: Denies Difficulty using hands for taps, buttons, cutlery, and/or writing: Reports  Review of Systems  Constitutional:  Positive for fatigue.  HENT:  Positive for mouth  dryness. Negative for mouth sores and nose dryness.   Eyes:  Positive for dryness. Negative for pain and visual disturbance.  Respiratory:  Negative for cough, hemoptysis and difficulty breathing.   Cardiovascular:  Positive for palpitations. Negative for chest pain, hypertension and swelling in legs/feet.  Gastrointestinal:  Positive for blood in stool, constipation and diarrhea.  Endocrine: Negative for increased urination.  Genitourinary:  Positive for involuntary urination. Negative for painful urination.  Musculoskeletal:  Positive for joint pain, joint pain, joint swelling, myalgias, morning stiffness, muscle tenderness and myalgias. Negative for gait problem and muscle weakness.  Skin:  Positive for hair loss and sensitivity to sunlight. Negative for color change, pallor, rash, nodules/bumps, skin tightness and ulcers.  Neurological:  Positive for headaches. Negative for numbness and weakness.  Hematological:  Negative for swollen glands.  Psychiatric/Behavioral:  Positive for sleep disturbance. Negative for depressed mood. The patient is not nervous/anxious.     PMFS History:  Patient Active Problem List   Diagnosis Date Noted   Pulmonary nodules/lesions, multiple 05/11/2021   Intractable pain    Acute on chronic pancreatitis (HCC) 10/28/2017   DDD (degenerative disc disease), cervical 07/12/2017   DDD (degenerative disc disease), lumbar 07/12/2017   Primary insomnia 05/30/2017   Other fatigue 05/30/2017   ANA positive 04/19/2017   History of chronic sinusitis 04/19/2017   Fibromyalgia 10/24/2016   Osteoarthritis of both knees 10/24/2016   IBS (irritable bowel syndrome) 10/24/2016   Migraines 10/24/2016   Chronic sinusitis 10/24/2016  Pelvic floor dysfunction 10/24/2016   Vulvar vestibulitis 10/24/2016   Dehydration 07/11/2016   Fatty liver 07/11/2016   Elevated LFTs 07/11/2016   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   Abdominal pain 01/05/2016   Pancreatitis,  acute    CAP (community acquired pneumonia) 06/19/2014   Pain management 06/18/2014   Chronic pancreatitis (HCC) 06/18/2014   Intractable migraine without aura 01/25/2014   Acute pancreatitis 09/25/2013   Leucocytosis 09/25/2013   Microcytic anemia 09/25/2013   Asthma 09/25/2013   Type 2 diabetes mellitus without complication, with long-term current use of insulin (HCC) 09/25/2013   Hyponatremia 09/25/2013    Past Medical History:  Diagnosis Date   Asthma    Bursitis    Chronic sinusitis    DDD (degenerative disc disease), cervical    DDD (degenerative disc disease), lumbar    Diabetes mellitus    Type 1    Fibromyalgia    Hyperlipidemia    IBS (irritable bowel syndrome)    Migraine without aura, with intractable migraine, so stated, without mention of status migrainosus 01/25/2014   Migraines    Osteoarthritis of both knees 10/24/2016   moderate   Pancreatitis chronic    Pelvic floor dysfunction 10/24/2016   Sjogren's syndrome (HCC)    Vulvar vestibulitis     Family History  Problem Relation Age of Onset   Asthma Mother    Pancreatic cancer Mother    High Cholesterol Father    Macular degeneration Father    Migraines Sister    Endometriosis Sister    Asthma Brother    GER disease Brother    Pancreatitis Paternal Grandfather    Pancreatic cancer Other    Pancreatitis Maternal Uncle    Past Surgical History:  Procedure Laterality Date   ABDOMINAL HYSTERECTOMY  05/27/2020   [facility] - per patient   BREAST REDUCTION SURGERY  2010   DILATION AND CURETTAGE OF UTERUS  2007   ERCP     Removed stone from Bile duct   ERCP     with MRI   PANCREAS SURGERY  1997   whiple  1997   whipple's     Social History   Social History Narrative   Not on file   Immunization History  Administered Date(s) Administered   Influenza Split 09/11/2010, 11/30/2013, 09/27/2014, 10/08/2018   Influenza,inj,Quad PF,6+ Mos 09/01/2012, 12/06/2017, 09/29/2018   Influenza-Unspecified  11/10/1998, 10/01/2006, 10/11/2008, 09/02/2014   Pneumococcal Polysaccharide-23 12/05/2010   Tdap 11/30/2004     Objective: Vital Signs: BP 136/83 (BP Location: Left Arm, Patient Position: Sitting, Cuff Size: Normal)   Pulse 76   Resp 15   Ht 5\' 4"  (1.626 m)   Wt 155 lb (70.3 kg)   LMP 04/03/2020   BMI 26.61 kg/m    Physical Exam Vitals and nursing note reviewed.  Constitutional:      Appearance: She is well-developed.  HENT:     Head: Normocephalic and atraumatic.  Eyes:     Conjunctiva/sclera: Conjunctivae normal.  Cardiovascular:     Rate and Rhythm: Normal rate and regular rhythm.     Heart sounds: Normal heart sounds.  Pulmonary:     Effort: Pulmonary effort is normal.     Breath sounds: Normal breath sounds.  Abdominal:     General: Bowel sounds are normal.     Palpations: Abdomen is soft.  Musculoskeletal:     Cervical back: Normal range of motion.  Lymphadenopathy:     Cervical: No cervical adenopathy.  Skin:  General: Skin is warm and dry.     Capillary Refill: Capillary refill takes less than 2 seconds.  Neurological:     Mental Status: She is alert and oriented to person, place, and time.  Psychiatric:        Behavior: Behavior normal.      Musculoskeletal Exam: Generalized hyperalgesia and positive tender points on exam.  Trapezius muscle tension and tenderness bilaterally.  Painful range of motion of the left shoulder with anterior left shoulder joint pain.  Elbow joints, wrist joints, MCPs, PIPs, DIPs have good range of motion with no synovitis.  Complete fist formation bilaterally.  Tenderness over the first MCP joint bilaterally.  Hip joints have slightly limited range of motion bilaterally.  Tenderness over the trochanteric bursa bilaterally.  Knee joints have good range of motion with no warmth or effusion.  Ankle joints have good range of motion with no tenderness or joint swelling.  CDAI Exam: CDAI Score: -- Patient Global: --; Provider Global:  -- Swollen: --; Tender: -- Joint Exam 06/14/2023   No joint exam has been documented for this visit   There is currently no information documented on the homunculus. Go to the Rheumatology activity and complete the homunculus joint exam.  Investigation: No additional findings.  Imaging: XR Shoulder Left  Result Date: 06/14/2023 No glenohumeral or acromioclavicular joint space narrowing was noted.  No chondrocalcinosis was noted. Impression: Unremarkable x-rays of the shoulder.   Recent Labs: Lab Results  Component Value Date   WBC 14.6 (H) 04/08/2020   HGB 10.0 (L) 04/08/2020   PLT 513 (H) 04/08/2020   NA 134 (L) 04/08/2020   K 3.2 (L) 04/08/2020   CL 98 04/08/2020   CO2 25 04/08/2020   GLUCOSE 130 (H) 04/08/2020   BUN 13 04/08/2020   CREATININE 0.61 04/08/2020   BILITOT 0.3 04/08/2020   ALKPHOS 74 04/08/2020   AST 17 04/08/2020   ALT 17 04/08/2020   PROT 7.8 04/08/2020   ALBUMIN 3.9 04/08/2020   CALCIUM 8.9 04/08/2020   GFRAA >60 04/08/2020    Speciality Comments: No specialty comments available.  Procedures:  No procedures performed Allergies: Ciprofloxacin hcl, Nortriptyline hcl, Amitriptyline, Compazine [prochlorperazine maleate], Cymbalta [duloxetine hcl], Dexilant [dexlansoprazole], Doxycycline, Flagyl [metronidazole hcl], Gabapentin, Penicillins, Penicillins cross reactors, Septra [sulfamethoxazole-trimethoprim], Tizanidine, Topamax [topiramate], Toradol [ketorolac tromethamine], Xanax [alprazolam], Zofran [ondansetron hcl], Amoxicillin, Breo ellipta [fluticasone furoate-vilanterol], Diflucan [fluconazole], Inapsine [droperidol], Ketamine, Levofloxacin, Lyrica [pregabalin], Phenergan [promethazine hcl], Phenothiazines, Primaxin [imipenem], Prozac [fluoxetine hcl], and Reglan [metoclopramide hcl]   Assessment / Plan:     Visit Diagnoses: Fibromyalgia: Patient continues have generalized hyperalgesia and positive tender points on examination.  She has been  experiencing more frequent and severe fibromyalgia flares which she attributes to being under increased stress recently.  She is also noticed increased fatigue and brain fog.  She was previously following up at integrative therapy twice a week for massage therapy as well as physical therapy.  She is unable to go to physical therapy since the physical therapist relocated.  She has kept up with massage therapy on a weekly basis.  Patient has been trying to walk for 1 hour daily for exercise and continues range of motion exercises.  She will be getting a membership for the [facility] and would like to try getting in the pool for exercise.  Discussed the importance of regular exercise, good sleep hygiene, and stress management.  Primary insomnia: Discussed the importance of good sleep hygiene.   Other fatigue: Patient has noticed increased  fatigue and brain fog which she attributes to being under increased stress.  Chronic left shoulder pain - Patient presents today with increased pain in the left shoulder joint.  No recent injury or fall.  She has good range of motion of the left shoulder joint on examination today but has tenderness anteriorly.  She has noticed progressively worsening pain in the left shoulder over the past several months and has been unable to go to physical therapy since her physical therapist has relocated.  She has continued massage therapy on a weekly basis but continues to have persistent discomfort in the left shoulder and requested a left shoulder x-ray today.  X-rays of the left shoulder were unremarkable.  Patient was given a handout of shoulder exercises to perform. She is planning on restarting physical therapy.    Plan: XR Shoulder Left  DDD (degenerative disc disease), cervical: C-spine has limited range of motion with lateral rotation.  Trapezius muscle tension and tenderness bilaterally. She goes for massage therapy once weekly.   Pain in thoracic spine: Patient continues to go to  massage therapy on a weekly basis.  DDD (degenerative disc disease), lumbar: Patient has been going to see a massage therapist on a weekly basis.  Trochanteric bursitis of left hip: Ongoing pain and stiffness in both hips.  Tenderness over the left trochanteric bursa.  Primary osteoarthritis of both knees: Good range of motion of both knee joints on examination today.  No warmth or effusion noted.  Sicca complex (HCC) - Negative ANA, positive SSB: She continues to have chronic sicca symptoms.  Hot flashes: She has been experiencing more frequent and severe hot flashes.  These episodes have been occurring up to 30-40 times per day.  She has also noticed some other symptoms of menopause.  She does not currently follow-up with a gynecologist but will notify us if she requires a referral.  She plans on trying to establish care with Dr. Leda Quail if possible for further evaluation.   Other medical conditions are listed as follows:   History of pancreatitis - Hereditary, followed up at [facility].  History of diabetes mellitus  Fatty liver  History of IBS  History of migraine  Pelvic floor dysfunction    Orders: Orders Placed This Encounter  Procedures   XR Shoulder Left   No orders of the defined types were placed in this encounter.    Follow-Up Instructions: Return in about 6 months (around 12/15/2023) for Fibromyalgia, Osteoarthritis, DDD.   Gearldine Bienenstock, PA-C  Note - This record has been created using Dragon software.  Chart creation errors have been sought, but may not always  have been located. Such creation errors do not reflect on  the standard of medical care.

## 2023-06-14 ENCOUNTER — Encounter: Payer: Self-pay | Admitting: Physician Assistant

## 2023-06-14 ENCOUNTER — Ambulatory Visit: Payer: Medicare Other

## 2023-06-14 ENCOUNTER — Ambulatory Visit: Payer: Medicare Other | Attending: Physician Assistant | Admitting: Physician Assistant

## 2023-06-14 VITALS — BP 136/83 | HR 76 | Resp 15 | Ht 64.0 in | Wt 155.0 lb

## 2023-06-14 DIAGNOSIS — K760 Fatty (change of) liver, not elsewhere classified: Secondary | ICD-10-CM | POA: Diagnosis present

## 2023-06-14 DIAGNOSIS — M5030 Other cervical disc degeneration, unspecified cervical region: Secondary | ICD-10-CM | POA: Diagnosis present

## 2023-06-14 DIAGNOSIS — M25512 Pain in left shoulder: Secondary | ICD-10-CM | POA: Insufficient documentation

## 2023-06-14 DIAGNOSIS — G8929 Other chronic pain: Secondary | ICD-10-CM | POA: Diagnosis not present

## 2023-06-14 DIAGNOSIS — M797 Fibromyalgia: Secondary | ICD-10-CM | POA: Diagnosis present

## 2023-06-14 DIAGNOSIS — Z8639 Personal history of other endocrine, nutritional and metabolic disease: Secondary | ICD-10-CM

## 2023-06-14 DIAGNOSIS — R5383 Other fatigue: Secondary | ICD-10-CM

## 2023-06-14 DIAGNOSIS — M546 Pain in thoracic spine: Secondary | ICD-10-CM

## 2023-06-14 DIAGNOSIS — Z8719 Personal history of other diseases of the digestive system: Secondary | ICD-10-CM | POA: Diagnosis present

## 2023-06-14 DIAGNOSIS — M6289 Other specified disorders of muscle: Secondary | ICD-10-CM | POA: Diagnosis present

## 2023-06-14 DIAGNOSIS — R232 Flushing: Secondary | ICD-10-CM

## 2023-06-14 DIAGNOSIS — F5101 Primary insomnia: Secondary | ICD-10-CM | POA: Diagnosis not present

## 2023-06-14 DIAGNOSIS — Z8669 Personal history of other diseases of the nervous system and sense organs: Secondary | ICD-10-CM | POA: Diagnosis present

## 2023-06-14 DIAGNOSIS — M51369 Other intervertebral disc degeneration, lumbar region without mention of lumbar back pain or lower extremity pain: Secondary | ICD-10-CM

## 2023-06-14 DIAGNOSIS — M7062 Trochanteric bursitis, left hip: Secondary | ICD-10-CM | POA: Diagnosis present

## 2023-06-14 DIAGNOSIS — M170 Bilateral primary osteoarthritis of knee: Secondary | ICD-10-CM

## 2023-06-14 DIAGNOSIS — M5136 Other intervertebral disc degeneration, lumbar region: Secondary | ICD-10-CM

## 2023-06-14 DIAGNOSIS — M3500 Sicca syndrome, unspecified: Secondary | ICD-10-CM

## 2023-06-14 NOTE — Progress Notes (Signed)
X-rays of the left shoulder are unremarkable.  Please notify the patient.

## 2023-06-14 NOTE — Patient Instructions (Signed)

## 2023-08-12 ENCOUNTER — Encounter: Payer: Self-pay | Admitting: Emergency Medicine

## 2023-08-12 MED ORDER — BUDESONIDE-FORMOTEROL FUMARATE 80-4.5 MCG/ACT IN AERO: 2.0000 | INHALATION_SPRAY | Freq: Two times a day (BID) | RESPIRATORY_TRACT | 12 refills | Status: DC

## 2023-12-04 NOTE — Progress Notes (Deleted)
 Office Visit Note  Patient: Heather Wagner             Date of Birth: Jun 25, 1976           MRN: [id number]             PCP: Elester Grim, MD Referring: No ref. provider found Visit Date: 12/18/2023 Occupation: @GUAROCC @  Subjective:  No chief complaint on file.   History of Present Illness: Tammye Kahler is a 48 y.o. female ***     Activities of Daily Living:  Patient reports morning stiffness for *** {minute/hour:19697}.   Patient {ACTIONS;DENIES/REPORTS:21021675::"Denies"} nocturnal pain.  Difficulty dressing/grooming: {ACTIONS;DENIES/REPORTS:21021675::"Denies"} Difficulty climbing stairs: {ACTIONS;DENIES/REPORTS:21021675::"Denies"} Difficulty getting out of chair: {ACTIONS;DENIES/REPORTS:21021675::"Denies"} Difficulty using hands for taps, buttons, cutlery, and/or writing: {ACTIONS;DENIES/REPORTS:21021675::"Denies"}  No Rheumatology ROS completed.   PMFS History:  Patient Active Problem List   Diagnosis Date Noted   Pulmonary nodules/lesions, multiple 05/11/2021   Intractable pain    Acute on chronic pancreatitis (HCC) 10/28/2017   DDD (degenerative disc disease), cervical 07/12/2017   DDD (degenerative disc disease), lumbar 07/12/2017   Primary insomnia 05/30/2017   Other fatigue 05/30/2017   ANA positive 04/19/2017   History of chronic sinusitis 04/19/2017   Fibromyalgia 10/24/2016   Osteoarthritis of both knees 10/24/2016   IBS (irritable bowel syndrome) 10/24/2016   Migraines 10/24/2016   Chronic sinusitis 10/24/2016   Pelvic floor dysfunction 10/24/2016   Vulvar vestibulitis 10/24/2016   Dehydration 07/11/2016   Fatty liver 07/11/2016   Elevated LFTs 07/11/2016   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   Abdominal pain 01/05/2016   Pancreatitis, acute    CAP (community acquired pneumonia) 06/19/2014   Pain management 06/18/2014   Chronic pancreatitis (HCC) 06/18/2014   Intractable migraine without aura 01/25/2014   Acute  pancreatitis 09/25/2013   Leucocytosis 09/25/2013   Microcytic anemia 09/25/2013   Asthma 09/25/2013   Type 2 diabetes mellitus without complication, with long-term current use of insulin  (HCC) 09/25/2013   Hyponatremia 09/25/2013    Past Medical History:  Diagnosis Date   Asthma    Bursitis    Chronic sinusitis    DDD (degenerative disc disease), cervical    DDD (degenerative disc disease), lumbar    Diabetes mellitus    Type 1    Fibromyalgia    Hyperlipidemia    IBS (irritable bowel syndrome)    Migraine without aura, with intractable migraine, so stated, without mention of status migrainosus 01/25/2014   Migraines    Osteoarthritis of both knees 10/24/2016   moderate   Pancreatitis chronic    Pelvic floor dysfunction 10/24/2016   Sjogren's syndrome (HCC)    Vulvar vestibulitis     Family History  Problem Relation Age of Onset   Asthma Mother    Pancreatic cancer Mother    High Cholesterol Father    Macular degeneration Father    Migraines Sister    Endometriosis Sister    Asthma Brother    GER disease Brother    Pancreatitis Paternal Grandfather    Pancreatic cancer Other    Pancreatitis Maternal Uncle    Past Surgical History:  Procedure Laterality Date   ABDOMINAL HYSTERECTOMY  05/27/2020   [facility] - per patient   BREAST REDUCTION SURGERY  2010   DILATION AND CURETTAGE OF UTERUS  2007   ERCP     Removed stone from Bile duct   ERCP     with MRI   PANCREAS SURGERY  1997   whiple  1997   whipple's  Social History   Social History Narrative   Not on file   Immunization History  Administered Date(s) Administered   Influenza Split 09/11/2010, 11/30/2013, 09/27/2014, 10/08/2018   Influenza,inj,Quad PF,6+ Mos 09/01/2012, 12/06/2017, 09/29/2018   Influenza-Unspecified 11/10/1998, 10/01/2006, 10/11/2008, 09/02/2014   Pneumococcal Polysaccharide-23 12/05/2010   Tdap 11/30/2004     Objective: Vital Signs: LMP 04/03/2020    Physical Exam    Musculoskeletal Exam: ***  CDAI Exam: CDAI Score: -- Patient Global: --; Provider Global: -- Swollen: --; Tender: -- Joint Exam 12/18/2023   No joint exam has been documented for this visit   There is currently no information documented on the homunculus. Go to the Rheumatology activity and complete the homunculus joint exam.  Investigation: No additional findings.  Imaging: No results found.  Recent Labs: Lab Results  Component Value Date   WBC 14.6 (H) 04/08/2020   HGB 10.0 (L) 04/08/2020   PLT 513 (H) 04/08/2020   NA 134 (L) 04/08/2020   K 3.2 (L) 04/08/2020   CL 98 04/08/2020   CO2 25 04/08/2020   GLUCOSE 130 (H) 04/08/2020   BUN 13 04/08/2020   CREATININE 0.61 04/08/2020   BILITOT 0.3 04/08/2020   ALKPHOS 74 04/08/2020   AST 17 04/08/2020   ALT 17 04/08/2020   PROT 7.8 04/08/2020   ALBUMIN 3.9 04/08/2020   CALCIUM 8.9 04/08/2020   GFRAA >60 04/08/2020    Speciality Comments: No specialty comments available.  Procedures:  No procedures performed Allergies: Ciprofloxacin hcl, Nortriptyline hcl, Amitriptyline, Compazine [prochlorperazine maleate], Cymbalta [duloxetine hcl], Dexilant [dexlansoprazole], Doxycycline, Flagyl [metronidazole hcl], Gabapentin, Penicillins, Penicillins cross reactors, Septra [sulfamethoxazole-trimethoprim], Tizanidine, Topamax [topiramate], Toradol  [ketorolac  tromethamine ], Xanax [alprazolam], Zofran  [ondansetron  hcl], Amoxicillin, Breo ellipta [fluticasone  furoate-vilanterol], Diflucan [fluconazole], Inapsine  [droperidol ], Ketamine, Levofloxacin , Lyrica [pregabalin], Phenergan  [promethazine  hcl], Phenothiazines, Primaxin  [imipenem ], Prozac [fluoxetine hcl], and Reglan [metoclopramide hcl]   Assessment / Plan:     Visit Diagnoses: Fibromyalgia  Primary insomnia  Other fatigue  DDD (degenerative disc disease), cervical  Pain in thoracic spine  Degeneration of intervertebral disc of lumbar region without discogenic back pain or  lower extremity pain  Trochanteric bursitis of left hip  Primary osteoarthritis of both knees  Sicca complex (HCC)  History of pancreatitis  History of diabetes mellitus  Fatty liver  History of migraine  History of IBS  Pelvic floor dysfunction  Orders: No orders of the defined types were placed in this encounter.  No orders of the defined types were placed in this encounter.   Face-to-face time spent with patient was *** minutes. Greater than 50% of time was spent in counseling and coordination of care.  Follow-Up Instructions: No follow-ups on file.   Romayne Clubs, PA-C  Note - This record has been created using Dragon software.  Chart creation errors have been sought, but may not always  have been located. Such creation errors do not reflect on  the standard of medical care.

## 2023-12-05 ENCOUNTER — Telehealth: Payer: Self-pay | Admitting: Emergency Medicine

## 2023-12-05 NOTE — Telephone Encounter (Signed)
 Patient has tested positive for RSV. Pharmacy was closed yesterday so she was unable to pick up prescriptions prescribed at the walk in clinic. She will be starting prednisone today. She has been trying to get rest and monitor her oxygen. She would like a call if there is something else she should be doing. Patient  can be reached at [phone] or [phone]

## 2023-12-05 NOTE — Telephone Encounter (Signed)
 Called and spoke with patient.  Patient was seen 12/03/23 at [facility] and tested positive for RSV.  Patient stated she was prescribed Prednisone for 5 days and Z pack.  Patient is starting meds today. Patient stated she is checking her O2 sats and sats have been good.  Patient is using Symbicort  twice daily, albuterol  as needed every 4 hours.  Advised patient to stay hydrated, rest, take Prednisone, and antibiotic as prescribed.  Advised patient to seek emergency care if symptoms become worse. Nothing further at this time.

## 2023-12-09 ENCOUNTER — Telehealth: Payer: Self-pay

## 2023-12-09 NOTE — Telephone Encounter (Signed)
 Pt states she is still feeling sick, she finished her prednisone and still feels like she has rsv. Pt does see [facility] for Asthma lov 11/20/22. Sob,chest congestion using her symbicort inhaler. Requesting prednisone please advise

## 2023-12-10 NOTE — Telephone Encounter (Signed)
 I think she needs a longer pred taper if she remainsn symptomatic. Unfortunately the RSV will have to run its course. She can use ORC symptomatic relief meds for cold symptoms.   Send in pred taper:  Take 40mg  daily for 3 days, then 30mg  daily for 3 days, then 20mg  daily for 3 days, then 10mg  daily for 3 days, then stop

## 2023-12-11 NOTE — Telephone Encounter (Signed)
 Called and spoke with the pt  [facility] and was prescribed pred taper there She asked for appt with By[facility] and I have her scheduled for tomorrow since there was an opening  Nothing further needed

## 2023-12-12 ENCOUNTER — Ambulatory Visit (INDEPENDENT_AMBULATORY_CARE_PROVIDER_SITE_OTHER): Payer: Medicare Other | Admitting: Emergency Medicine

## 2023-12-12 ENCOUNTER — Encounter: Payer: Self-pay | Admitting: Emergency Medicine

## 2023-12-12 VITALS — BP 164/89 | HR 91 | Temp 98.1°F | Ht 64.0 in | Wt 166.8 lb

## 2023-12-12 DIAGNOSIS — J4530 Mild persistent asthma, uncomplicated: Secondary | ICD-10-CM

## 2023-12-12 DIAGNOSIS — J45909 Unspecified asthma, uncomplicated: Secondary | ICD-10-CM | POA: Diagnosis not present

## 2023-12-12 MED ORDER — BENZONATATE 100 MG PO CAPS: 100.0000 mg | ORAL_CAPSULE | Freq: Four times a day (QID) | ORAL | 1 refills | Status: DC | PRN

## 2023-12-12 MED ORDER — ALBUTEROL SULFATE HFA 108 (90 BASE) MCG/ACT IN AERS
2.0000 | INHALATION_SPRAY | Freq: Four times a day (QID) | RESPIRATORY_TRACT | 6 refills | Status: DC | PRN
Start: 2023-12-12 — End: 2024-06-29

## 2023-12-12 NOTE — Progress Notes (Signed)
   Subjective:    Patient ID: Heather Wagner, female    DOB: 1976-03-30, 48 y.o.   MRN: [id number]  HPI  Acute office visit 12/12/2023 --[name] is 48 with a history of type 1 diabetes, IBS, migraines, Sjogren's, fibromyalgia syndrome, mild persistent asthma with associated allergic rhinitis.  Has been managed on budesonide /formoterol  (generic Symbicort ), nasal saline rinses as needed.  She is here today reporting she has been well until end of Dec, with rare albuterol  use. She had a URI - positive for RSV-  late December, developed persistent cough, SOB. She improved briefly with pred x 5 days, then sx again. Then at [facility] got a longer taper (started 12/10/23). Completed azithro. Still using her albuterol  every 4 hours. She is using mucinex. Uses hydrocodone  for cough.    Review of Systems As per HPI      Objective:   Physical Exam  Vitals:   12/12/23 0936  BP: (!) 164/89  Pulse: 91  Temp: 98.1 F (36.7 C)  TempSrc: Oral  SpO2: 98%  Weight: 166 lb 12.8 oz (75.7 kg)  Height: 5' 4 (1.626 m)   Gen: Pleasant, well-nourished, in no distress,  normal affect  ENT: No lesions,  mouth clear,  oropharynx clear, no postnasal drip  Neck: No JVD, no stridor  Lungs: No use of accessory muscles, no crackles or wheezing on normal respiration, no wheeze on forced expiration  Cardiovascular: RRR, heart sounds normal, no murmur or gallops, no peripheral edema  Musculoskeletal: No deformities, no cyanosis or clubbing  Neuro: alert, awake, non focal  Skin: Warm, no lesions or rash     Assessment & Plan:   Asthma Acute asthma flare with persistent dyspnea, slow to resolve after diagnosed with RSV in late December.  Received azithromycin  and short course of prednisone but still symptomatic.  Then started on a longer prednisone taper at urgent care.  Plan to continue this.  Hold off on further antibiotics, especially since she tolerates most antibiotics poorly and has them on her allergy   or sensitivity list.  Please continue your prednisone taper until completely gone Continue your generic Symbicort  twice daily.  Rinse and gargle after using. Keep your albuterol  available to use 2 puffs when needed for shortness of breath, chest tightness, wheezing. Agree with Mucinex as needed We will send a prescription for Tessalon  Perles to help with cough suppression We will hold off on antibiotics for now given your multiple allergies and sensitivities.  If you worsen in any way then please contact the office or go to the ED for further evaluation Follow with Dr Shelah in 6 months or sooner if you have any problems    Lamar Shelah, MD, PhD 12/12/2023, 12:57 PM [facility] [phone] or if no answer before 7:00PM call [phone] For any issues after 7:00PM please call eLink [phone]

## 2023-12-12 NOTE — Patient Instructions (Addendum)
 Please continue your prednisone taper until completely gone Continue your generic Symbicort  twice daily.  Rinse and gargle after using. Keep your albuterol  available to use 2 puffs when needed for shortness of breath, chest tightness, wheezing. Agree with Mucinex as needed We will send a prescription for Tessalon  Perles to help with cough suppression We will hold off on antibiotics for now given your multiple allergies and sensitivities.  If you worsen in any way then please contact the office or go to the ED for further evaluation Follow with Dr Shelah in 6 months or sooner if you have any problems

## 2023-12-12 NOTE — Assessment & Plan Note (Signed)
 Acute asthma flare with persistent dyspnea, slow to resolve after diagnosed with RSV in late December.  Received azithromycin  and short course of prednisone but still symptomatic.  Then started on a longer prednisone taper at urgent care.  Plan to continue this.  Hold off on further antibiotics, especially since she tolerates most antibiotics poorly and has them on her allergy  or sensitivity list.  Please continue your prednisone taper until completely gone Continue your generic Symbicort  twice daily.  Rinse and gargle after using. Keep your albuterol  available to use 2 puffs when needed for shortness of breath, chest tightness, wheezing. Agree with Mucinex as needed We will send a prescription for Tessalon  Perles to help with cough suppression We will hold off on antibiotics for now given your multiple allergies and sensitivities.  If you worsen in any way then please contact the office or go to the ED for further evaluation Follow with Dr Shelah in 6 months or sooner if you have any problems

## 2023-12-18 ENCOUNTER — Ambulatory Visit: Payer: Medicare Other | Admitting: Rheumatology

## 2023-12-18 DIAGNOSIS — M51369 Other intervertebral disc degeneration, lumbar region without mention of lumbar back pain or lower extremity pain: Secondary | ICD-10-CM

## 2023-12-18 DIAGNOSIS — M7062 Trochanteric bursitis, left hip: Secondary | ICD-10-CM

## 2023-12-18 DIAGNOSIS — M6289 Other specified disorders of muscle: Secondary | ICD-10-CM

## 2023-12-18 DIAGNOSIS — M5030 Other cervical disc degeneration, unspecified cervical region: Secondary | ICD-10-CM

## 2023-12-18 DIAGNOSIS — M170 Bilateral primary osteoarthritis of knee: Secondary | ICD-10-CM

## 2023-12-18 DIAGNOSIS — K760 Fatty (change of) liver, not elsewhere classified: Secondary | ICD-10-CM

## 2023-12-18 DIAGNOSIS — M546 Pain in thoracic spine: Secondary | ICD-10-CM

## 2023-12-18 DIAGNOSIS — F5101 Primary insomnia: Secondary | ICD-10-CM

## 2023-12-18 DIAGNOSIS — Z8719 Personal history of other diseases of the digestive system: Secondary | ICD-10-CM

## 2023-12-18 DIAGNOSIS — M797 Fibromyalgia: Secondary | ICD-10-CM

## 2023-12-18 DIAGNOSIS — R5383 Other fatigue: Secondary | ICD-10-CM

## 2023-12-18 DIAGNOSIS — M3500 Sicca syndrome, unspecified: Secondary | ICD-10-CM

## 2023-12-18 DIAGNOSIS — Z8669 Personal history of other diseases of the nervous system and sense organs: Secondary | ICD-10-CM

## 2023-12-18 DIAGNOSIS — Z8639 Personal history of other endocrine, nutritional and metabolic disease: Secondary | ICD-10-CM

## 2024-01-02 NOTE — Progress Notes (Deleted)
 Office Visit Note  Patient: Heather Wagner             Date of Birth: 1976/07/08           MRN: [id number]             PCP: Ollen Bowl, MD Referring: Ollen Bowl, MD Visit Date: 01/16/2024 Occupation: @GUAROCC @  Subjective:  No chief complaint on file.   History of Present Illness: Heather Wagner is a 48 y.o. female ***     Activities of Daily Living:  Patient reports morning stiffness for *** {minute/hour:19697}.   Patient {ACTIONS;DENIES/REPORTS:21021675::"Denies"} nocturnal pain.  Difficulty dressing/grooming: {ACTIONS;DENIES/REPORTS:21021675::"Denies"} Difficulty climbing stairs: {ACTIONS;DENIES/REPORTS:21021675::"Denies"} Difficulty getting out of chair: {ACTIONS;DENIES/REPORTS:21021675::"Denies"} Difficulty using hands for taps, buttons, cutlery, and/or writing: {ACTIONS;DENIES/REPORTS:21021675::"Denies"}  No Rheumatology ROS completed.   PMFS History:  Patient Active Problem List   Diagnosis Date Noted  . Pulmonary nodules/lesions, multiple 05/11/2021  . Intractable pain   . Acute on chronic pancreatitis (HCC) 10/28/2017  . DDD (degenerative disc disease), cervical 07/12/2017  . DDD (degenerative disc disease), lumbar 07/12/2017  . Primary insomnia 05/30/2017  . Other fatigue 05/30/2017  . ANA positive 04/19/2017  . History of chronic sinusitis 04/19/2017  . Fibromyalgia 10/24/2016  . Osteoarthritis of both knees 10/24/2016  . IBS (irritable bowel syndrome) 10/24/2016  . Migraines 10/24/2016  . Chronic sinusitis 10/24/2016  . Pelvic floor dysfunction 10/24/2016  . Vulvar vestibulitis 10/24/2016  . Dehydration 07/11/2016  . Fatty liver 07/11/2016  . Elevated LFTs 07/11/2016  . 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 Abdominal pain 01/05/2016  . Pancreatitis, acute   . CAP (community acquired pneumonia) 06/19/2014  . Pain management 06/18/2014  . Chronic pancreatitis (HCC) 06/18/2014  . Intractable migraine without aura  01/25/2014  . Acute pancreatitis 09/25/2013  . Leucocytosis 09/25/2013  . Microcytic anemia 09/25/2013  . Asthma 09/25/2013  . Type 2 diabetes mellitus without complication, with long-term current use of insulin (HCC) 09/25/2013  . Hyponatremia 09/25/2013    Past Medical History:  Diagnosis Date  . Asthma   . Bursitis   . Chronic sinusitis   . DDD (degenerative disc disease), cervical   . DDD (degenerative disc disease), lumbar   . Diabetes mellitus    Type 1   . Fibromyalgia   . Hyperlipidemia   . IBS (irritable bowel syndrome)   . Migraine without aura, with intractable migraine, so stated, without mention of status migrainosus 01/25/2014  . Migraines   . Osteoarthritis of both knees 10/24/2016   moderate  . Pancreatitis chronic   . Pelvic floor dysfunction 10/24/2016  . Sjogren's syndrome (HCC)   . Vulvar vestibulitis     Family History  Problem Relation Age of Onset  . Asthma Mother   . Pancreatic cancer Mother   . High Cholesterol Father   . Macular degeneration Father   . Migraines Sister   . Endometriosis Sister   . Asthma Brother   . GER disease Brother   . Pancreatitis Paternal Grandfather   . Pancreatic cancer Other   . Pancreatitis Maternal Uncle    Past Surgical History:  Procedure Laterality Date  . ABDOMINAL HYSTERECTOMY  05/27/2020   [facility] - per patient  . BREAST REDUCTION SURGERY  2010  . DILATION AND CURETTAGE OF UTERUS  2007  . ERCP     Removed stone from Bile duct  . ERCP     with MRI  . PANCREAS SURGERY  1997  . whiple  1997  . whipple's  Social History   Social History Narrative  . Not on file   Immunization History  Administered Date(s) Administered  . Influenza Split 09/11/2010, 11/30/2013, 09/27/2014, 10/08/2018  . Influenza,inj,Quad PF,6+ Mos 09/01/2012, 12/06/2017, 09/29/2018  . Influenza-Unspecified 11/10/1998, 10/01/2006, 10/11/2008, 09/02/2014  . Pneumococcal Polysaccharide-23 12/05/2010  . Tdap 11/30/2004      Objective: Vital Signs: LMP 04/03/2020    Physical Exam   Musculoskeletal Exam: ***  CDAI Exam: CDAI Score: -- Patient Global: --; Provider Global: -- Swollen: --; Tender: -- Joint Exam 01/16/2024   No joint exam has been documented for this visit   There is currently no information documented on the homunculus. Go to the Rheumatology activity and complete the homunculus joint exam.  Investigation: No additional findings.  Imaging: No results found.  Recent Labs: Lab Results  Component Value Date   WBC 14.6 (H) 04/08/2020   HGB 10.0 (L) 04/08/2020   PLT 513 (H) 04/08/2020   NA 134 (L) 04/08/2020   K 3.2 (L) 04/08/2020   CL 98 04/08/2020   CO2 25 04/08/2020   GLUCOSE 130 (H) 04/08/2020   BUN 13 04/08/2020   CREATININE 0.61 04/08/2020   BILITOT 0.3 04/08/2020   ALKPHOS 74 04/08/2020   AST 17 04/08/2020   ALT 17 04/08/2020   PROT 7.8 04/08/2020   ALBUMIN 3.9 04/08/2020   CALCIUM 8.9 04/08/2020   GFRAA >60 04/08/2020    Speciality Comments: No specialty comments available.  Procedures:  No procedures performed Allergies: Ciprofloxacin hcl, Nortriptyline hcl, Amitriptyline, Compazine [prochlorperazine maleate], Cymbalta [duloxetine hcl], Dexilant [dexlansoprazole], Doxycycline, Flagyl [metronidazole hcl], Gabapentin, Penicillins, Penicillins cross reactors, Septra [sulfamethoxazole-trimethoprim], Tizanidine, Topamax [topiramate], Toradol [ketorolac tromethamine], Xanax [alprazolam], Zofran [ondansetron hcl], Amoxicillin, Breo ellipta [fluticasone furoate-vilanterol], Diflucan [fluconazole], Inapsine [droperidol], Ketamine, Levofloxacin, Lyrica [pregabalin], Phenergan [promethazine hcl], Phenothiazines, Primaxin [imipenem], Prozac [fluoxetine hcl], and Reglan [metoclopramide hcl]   Assessment / Plan:     Visit Diagnoses: Fibromyalgia  Primary insomnia  Other fatigue  DDD (degenerative disc disease), cervical  Pain in thoracic spine  Degeneration of  intervertebral disc of lumbar region without discogenic back pain or lower extremity pain  Trochanteric bursitis of left hip  Primary osteoarthritis of both knees  Sicca complex (HCC)  History of pancreatitis  History of diabetes mellitus  Fatty liver  History of IBS  History of migraine  Pelvic floor dysfunction  Orders: No orders of the defined types were placed in this encounter.  No orders of the defined types were placed in this encounter.   Face-to-face time spent with patient was *** minutes. Greater than 50% of time was spent in counseling and coordination of care.  Follow-Up Instructions: No follow-ups on file.   Gearldine Bienenstock, PA-C  Note - This record has been created using Dragon software.  Chart creation errors have been sought, but may not always  have been located. Such creation errors do not reflect on  the standard of medical care.

## 2024-01-16 ENCOUNTER — Ambulatory Visit: Payer: Medicare Other | Admitting: Physician Assistant

## 2024-01-16 DIAGNOSIS — R5383 Other fatigue: Secondary | ICD-10-CM

## 2024-01-16 DIAGNOSIS — M5030 Other cervical disc degeneration, unspecified cervical region: Secondary | ICD-10-CM

## 2024-01-16 DIAGNOSIS — M3500 Sicca syndrome, unspecified: Secondary | ICD-10-CM

## 2024-01-16 DIAGNOSIS — M797 Fibromyalgia: Secondary | ICD-10-CM

## 2024-01-16 DIAGNOSIS — K760 Fatty (change of) liver, not elsewhere classified: Secondary | ICD-10-CM

## 2024-01-16 DIAGNOSIS — Z8719 Personal history of other diseases of the digestive system: Secondary | ICD-10-CM

## 2024-01-16 DIAGNOSIS — F5101 Primary insomnia: Secondary | ICD-10-CM

## 2024-01-16 DIAGNOSIS — Z8639 Personal history of other endocrine, nutritional and metabolic disease: Secondary | ICD-10-CM

## 2024-01-16 DIAGNOSIS — M170 Bilateral primary osteoarthritis of knee: Secondary | ICD-10-CM

## 2024-01-16 DIAGNOSIS — M51369 Other intervertebral disc degeneration, lumbar region without mention of lumbar back pain or lower extremity pain: Secondary | ICD-10-CM

## 2024-01-16 DIAGNOSIS — Z8669 Personal history of other diseases of the nervous system and sense organs: Secondary | ICD-10-CM

## 2024-01-16 DIAGNOSIS — M6289 Other specified disorders of muscle: Secondary | ICD-10-CM

## 2024-01-16 DIAGNOSIS — M546 Pain in thoracic spine: Secondary | ICD-10-CM

## 2024-01-16 DIAGNOSIS — M7062 Trochanteric bursitis, left hip: Secondary | ICD-10-CM

## 2024-01-23 NOTE — Progress Notes (Unsigned)
 Office Visit Note  Patient: Heather Wagner             Date of Birth: 1976-10-15           MRN: [id number]             PCP: Ollen Bowl, MD Referring: Ollen Bowl, MD Visit Date: 02/06/2024 Occupation: @GUAROCC @  Subjective:  Generalized pain   History of Present Illness: Heather Wagner is a 48 y.o. female with history of fibromyalgia, osteoarthritis, and DDD.  Patient reports that since her last office visit she has been experiencing recurrent flares.  Patient states that she has been experiencing more frequent migraines, symptoms of menopause, and frequent flares of pancreatitis.  She is also been having frequent fibromyalgia flares.  Patient states that she has not been able to continue maintenance massages and physical therapy which previously provided relief.  Her last massage was in December 2024.  Patient requested a new order for physical therapy and is hoping that her massage therapist will be returning to town in April 2025.  Her pain has been most severe in the left shoulder and left hip due to trochanter bursitis.  She continues to have generalized myalgias and muscle tenderness due to fibromyalgia.  Patient plans on starting to increase her walking regimen gradually and will be utilizing her [employer] gym membership more frequently.  She is interested in water exercise.  Activities of Daily Living:  Patient reports morning stiffness for 2-6 hours.   Patient Reports nocturnal pain.  Difficulty dressing/grooming: Denies Difficulty climbing stairs: Denies Difficulty getting out of chair: Denies Difficulty using hands for taps, buttons, cutlery, and/or writing: Denies  Review of Systems  Constitutional:  Positive for fatigue.  HENT:  Positive for mouth dryness. Negative for mouth sores.   Eyes:  Positive for dryness.  Respiratory:  Negative for shortness of breath.   Cardiovascular:  Positive for palpitations. Negative for chest pain.  Gastrointestinal:  Positive  for constipation and diarrhea. Negative for blood in stool.  Endocrine: Negative for increased urination.  Genitourinary:  Positive for involuntary urination.  Musculoskeletal:  Positive for joint pain, joint pain, myalgias, morning stiffness, muscle tenderness and myalgias. Negative for gait problem, joint swelling and muscle weakness.  Skin:  Positive for hair loss and sensitivity to sunlight. Negative for color change and rash.  Allergic/Immunologic: Positive for susceptible to infections.  Neurological:  Positive for headaches. Negative for dizziness.  Hematological:  Negative for swollen glands.  Psychiatric/Behavioral:  Positive for sleep disturbance. Negative for depressed mood. The patient is nervous/anxious.     PMFS History:  Patient Active Problem List   Diagnosis Date Noted   Pulmonary nodules/lesions, multiple 05/11/2021   Intractable pain    Acute on chronic pancreatitis (HCC) 10/28/2017   DDD (degenerative disc disease), cervical 07/12/2017   DDD (degenerative disc disease), lumbar 07/12/2017   Primary insomnia 05/30/2017   Other fatigue 05/30/2017   ANA positive 04/19/2017   History of chronic sinusitis 04/19/2017   Fibromyalgia 10/24/2016   Osteoarthritis of both knees 10/24/2016   IBS (irritable bowel syndrome) 10/24/2016   Migraines 10/24/2016   Chronic sinusitis 10/24/2016   Pelvic floor dysfunction 10/24/2016   Vulvar vestibulitis 10/24/2016   Dehydration 07/11/2016   Fatty liver 07/11/2016   Elevated LFTs 07/11/2016   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   Abdominal pain 01/05/2016   Pancreatitis, acute    CAP (community acquired pneumonia) 06/19/2014   Pain management 06/18/2014   Chronic pancreatitis (HCC) 06/18/2014  Intractable migraine without aura 01/25/2014   Acute pancreatitis 09/25/2013   Leucocytosis 09/25/2013   Microcytic anemia 09/25/2013   Asthma 09/25/2013   Type 2 diabetes mellitus without complication, with long-term  current use of insulin (HCC) 09/25/2013   Hyponatremia 09/25/2013    Past Medical History:  Diagnosis Date   Asthma    Bursitis    Chronic sinusitis    DDD (degenerative disc disease), cervical    DDD (degenerative disc disease), lumbar    Diabetes mellitus    Type 1    Fibromyalgia    Hyperlipidemia    IBS (irritable bowel syndrome)    Migraine without aura, with intractable migraine, so stated, without mention of status migrainosus 01/25/2014   Migraines    Osteoarthritis of both knees 10/24/2016   moderate   Pancreatitis chronic    Pelvic floor dysfunction 10/24/2016   Sjogren's syndrome (HCC)    Vulvar vestibulitis     Family History  Problem Relation Age of Onset   Asthma Mother    Pancreatic cancer Mother    High Cholesterol Father    Macular degeneration Father    Migraines Sister    Endometriosis Sister    Asthma Brother    GER disease Brother    Pancreatitis Paternal Grandfather    Pancreatic cancer Other    Pancreatitis Maternal Uncle    Past Surgical History:  Procedure Laterality Date   ABDOMINAL HYSTERECTOMY  05/27/2020   [facility] - per patient   BREAST REDUCTION SURGERY  2010   DILATION AND CURETTAGE OF UTERUS  2007   ERCP     Removed stone from Bile duct   ERCP     with MRI   PANCREAS SURGERY  1997   whiple  1997   whipple's     Social History   Social History Narrative   Not on file   Immunization History  Administered Date(s) Administered   Influenza Split 09/11/2010, 11/30/2013, 09/27/2014, 10/08/2018   Influenza,inj,Quad PF,6+ Mos 09/01/2012, 12/06/2017, 09/29/2018   Influenza-Unspecified 11/10/1998, 10/01/2006, 10/11/2008, 09/02/2014   Pneumococcal Polysaccharide-23 12/05/2010   Tdap 11/30/2004     Objective: Vital Signs: BP 129/88 (BP Location: Left Arm, Patient Position: Sitting, Cuff Size: Normal)   Pulse 73   Resp 14   Ht 5\' 4"  (1.626 m)   Wt 168 lb (76.2 kg)   LMP 04/03/2020   BMI 28.84 kg/m    Physical Exam Vitals and  nursing note reviewed.  Constitutional:      Appearance: She is well-developed.  HENT:     Head: Normocephalic and atraumatic.  Eyes:     Conjunctiva/sclera: Conjunctivae normal.  Cardiovascular:     Rate and Rhythm: Normal rate and regular rhythm.     Heart sounds: Normal heart sounds.  Pulmonary:     Effort: Pulmonary effort is normal.     Breath sounds: Normal breath sounds.  Abdominal:     General: Bowel sounds are normal.     Palpations: Abdomen is soft.  Musculoskeletal:     Cervical back: Normal range of motion.  Lymphadenopathy:     Cervical: No cervical adenopathy.  Skin:    General: Skin is warm and dry.     Capillary Refill: Capillary refill takes less than 2 seconds.  Neurological:     Mental Status: She is alert and oriented to person, place, and time.  Psychiatric:        Behavior: Behavior normal.      Musculoskeletal Exam: Generalized hyperalgesia and positive  tender points on exam.  C-spine has good range of motion.  Trapezius muscle tension and tenderness bilaterally.  Limited mobility of the lumbar spine.  Shoulder joints have good range of motion with some discomfort in the left shoulder.  Elbow joints, wrist joints, MCPs, PIPs, DIPs have good range of motion with no synovitis.  Complete fist formation bilaterally.  Hip joints have good range of motion with some discomfort in the left hip particularly over the left trochanteric bursa.  Knee joints have good range of motion no warmth or effusion.  Ankle joints have good range of motion with no tenderness or joint swelling.  CDAI Exam: CDAI Score: -- Patient Global: --; Provider Global: -- Swollen: --; Tender: -- Joint Exam 02/06/2024   No joint exam has been documented for this visit   There is currently no information documented on the homunculus. Go to the Rheumatology activity and complete the homunculus joint exam.  Investigation: No additional findings.  Imaging: No results found.  Recent  Labs: Lab Results  Component Value Date   WBC 14.6 (H) 04/08/2020   HGB 10.0 (L) 04/08/2020   PLT 513 (H) 04/08/2020   NA 134 (L) 04/08/2020   K 3.2 (L) 04/08/2020   CL 98 04/08/2020   CO2 25 04/08/2020   GLUCOSE 130 (H) 04/08/2020   BUN 13 04/08/2020   CREATININE 0.61 04/08/2020   BILITOT 0.3 04/08/2020   ALKPHOS 74 04/08/2020   AST 17 04/08/2020   ALT 17 04/08/2020   PROT 7.8 04/08/2020   ALBUMIN 3.9 04/08/2020   CALCIUM 8.9 04/08/2020   GFRAA >60 04/08/2020    Speciality Comments: No specialty comments available.  Procedures:  No procedures performed Allergies: Ciprofloxacin hcl, Nortriptyline hcl, Amitriptyline, Compazine [prochlorperazine maleate], Cymbalta [duloxetine hcl], Dexilant [dexlansoprazole], Doxycycline, Flagyl [metronidazole hcl], Gabapentin, Penicillins, Penicillins cross reactors, Septra [sulfamethoxazole-trimethoprim], Tizanidine, Topamax [topiramate], Toradol [ketorolac tromethamine], Xanax [alprazolam], Zofran [ondansetron hcl], Amoxicillin, Breo ellipta [fluticasone furoate-vilanterol], Diflucan [fluconazole], Inapsine [droperidol], Ketamine, Levofloxacin, Lyrica [pregabalin], Phenergan [promethazine hcl], Phenothiazines, Primaxin [imipenem], Prozac [fluoxetine hcl], and Reglan [metoclopramide hcl]   Assessment / Plan:     Visit Diagnoses: Fibromyalgia: She has generalized hyperalgesia and positive tender points on examination.  She is been experiencing more frequent and severe fibromyalgia flares.  She was previously having maintenance massages and going integrative therapies for cupping, ultrasound, and other therapeutics.  Her last massage was in December 2024.  Patient is hoping that her previous massage therapist will be returning to town in April 2025.  She will be looking for a new physical therapist as well.  She is interested in aquatic therapy.  She was given an order for physical therapy to include evaluation and treatment of fibromyalgia, back pain,  left shoulder pain, and left trochanteric bursitis.  Patient plans on gradually increasing her walking regimen and will be going to the [facility] gym for exercise.  She was advised to notify us if she continues to have persistent severe flares.  She will follow-up in the office in 3 months or sooner if needed.   Other fatigue: She is not experiencing increased fatigue.  She is been experiencing recurrent fibromyalgia flares as well as menopause symptoms.  She will be following up with gynecology next week.  Patient plans on gradually increasing her exercise regimen.  Primary insomnia: She has been experiencing interrupted sleep at night due to nocturnal pain.  Discussed importance of regular exercise and good hygiene.  Chronic left shoulder pain - X-rays of the left shoulder were unremarkable.  Left shoulder pain was previously alleviated by physical therapy including dry needling and ultrasound.  A new order for physical therapy was provided to the patient today.  DDD (degenerative disc disease), cervical: Patient continues to have chronic pain and stiffness in the cervical spine.  She has trapezius muscle tension tenderness bilaterally.  She will benefit from physical therapy.  A new order for physical therapy was placed today.  Pain in thoracic spine: Chronic pain. An order for PT placed today.   Spondylosis of lumbar spine - Chronic pain.  Her last massage was in December 2024.  She will be establishing care for maintenance massages as well as physical therapy.  Trochanteric bursitis of left hip: Chronic pain.  She has tenderness over the left trochanteric bursa.  A new referral for physical therapy was provided.  Primary osteoarthritis of both knees: She has good range of motion of both knee joints on examination today.  No warmth or effusion.  Patient plans on gradually increasing her walking regimen and is also interested in pursuing water exercise.  Sicca complex (HCC) - Negative ANA, positive  SSB: Chronic   Other medical conditions are listed as follows:  History of pancreatitis - Hereditary, followed up at [facility].  History of diabetes mellitus  Fatty liver  History of migraine  History of IBS  Pelvic floor dysfunction  Hot flashes   Orders: No orders of the defined types were placed in this encounter.  No orders of the defined types were placed in this encounter.    Follow-Up Instructions: Return in about 3 months (around 05/08/2024) for Fibromyalgia, Osteoarthritis, DDD.   Gearldine Bienenstock, PA-C  Note - This record has been created using Dragon software.  Chart creation errors have been sought, but may not always  have been located. Such creation errors do not reflect on  the standard of medical care.

## 2024-02-06 ENCOUNTER — Encounter: Payer: Self-pay | Admitting: Physician Assistant

## 2024-02-06 ENCOUNTER — Ambulatory Visit: Payer: Medicare Other | Attending: Physician Assistant | Admitting: Physician Assistant

## 2024-02-06 VITALS — BP 129/88 | HR 73 | Resp 14 | Ht 64.0 in | Wt 168.0 lb

## 2024-02-06 DIAGNOSIS — M47816 Spondylosis without myelopathy or radiculopathy, lumbar region: Secondary | ICD-10-CM | POA: Diagnosis present

## 2024-02-06 DIAGNOSIS — M797 Fibromyalgia: Secondary | ICD-10-CM

## 2024-02-06 DIAGNOSIS — M7062 Trochanteric bursitis, left hip: Secondary | ICD-10-CM | POA: Diagnosis present

## 2024-02-06 DIAGNOSIS — M170 Bilateral primary osteoarthritis of knee: Secondary | ICD-10-CM

## 2024-02-06 DIAGNOSIS — Z8669 Personal history of other diseases of the nervous system and sense organs: Secondary | ICD-10-CM

## 2024-02-06 DIAGNOSIS — G8929 Other chronic pain: Secondary | ICD-10-CM | POA: Diagnosis present

## 2024-02-06 DIAGNOSIS — Z8719 Personal history of other diseases of the digestive system: Secondary | ICD-10-CM | POA: Diagnosis present

## 2024-02-06 DIAGNOSIS — M25512 Pain in left shoulder: Secondary | ICD-10-CM

## 2024-02-06 DIAGNOSIS — K760 Fatty (change of) liver, not elsewhere classified: Secondary | ICD-10-CM

## 2024-02-06 DIAGNOSIS — R232 Flushing: Secondary | ICD-10-CM | POA: Diagnosis present

## 2024-02-06 DIAGNOSIS — M6289 Other specified disorders of muscle: Secondary | ICD-10-CM | POA: Diagnosis present

## 2024-02-06 DIAGNOSIS — R5383 Other fatigue: Secondary | ICD-10-CM

## 2024-02-06 DIAGNOSIS — M5030 Other cervical disc degeneration, unspecified cervical region: Secondary | ICD-10-CM | POA: Diagnosis present

## 2024-02-06 DIAGNOSIS — M546 Pain in thoracic spine: Secondary | ICD-10-CM | POA: Diagnosis present

## 2024-02-06 DIAGNOSIS — M3500 Sicca syndrome, unspecified: Secondary | ICD-10-CM | POA: Diagnosis present

## 2024-02-06 DIAGNOSIS — F5101 Primary insomnia: Secondary | ICD-10-CM

## 2024-02-06 DIAGNOSIS — Z8639 Personal history of other endocrine, nutritional and metabolic disease: Secondary | ICD-10-CM

## 2024-04-28 NOTE — Progress Notes (Unsigned)
 Office Visit Note  Patient: Heather Wagner             Date of Birth: 11-12-1976           MRN: [id number]             PCP: Elester Grim, MD Referring: Elester Grim, MD Visit Date: 05/12/2024 Occupation: @GUAROCC @  Subjective:  Generalized pain   History of Present Illness: Heather Wagner is a 48 y.o. female with history of fibromyalgia and DDD.  She continues to have generalized myalgias and muscle tenderness due to fibromyalgia.  Patient reports that she has been unable to resume physical therapy on a consistent basis due to recurrent attacks of pancreatitis.  Patient is currently awaiting an appointment at [facility] gastroenterology.  Patient states that she has not been as active as she would like to be due to the pain she has been experiencing.  Patient states that she has started trying to walk 30 minutes on a daily basis and would like to work back up to walking an hour on a daily basis.  She continues to have persistent discomfort in the left shoulder and left knee joint.  She uses Voltaren  gel topically as needed for symptomatic relief.  She occasionally applies ice on her knee if needed.    Activities of Daily Living:  Patient reports morning stiffness for 1-2 hours.   Patient Reports nocturnal pain.  Difficulty dressing/grooming: Denies Difficulty climbing stairs: Denies Difficulty getting out of chair: Denies Difficulty using hands for taps, buttons, cutlery, and/or writing: Reports  Review of Systems  Constitutional:  Positive for fatigue.  HENT:  Positive for mouth dryness. Negative for mouth sores.   Eyes:  Positive for dryness.  Cardiovascular:  Positive for palpitations. Negative for chest pain.  Gastrointestinal:  Positive for blood in stool (only when constipated) and constipation. Negative for diarrhea.  Endocrine: Negative for increased urination.  Genitourinary:  Positive for involuntary urination.  Musculoskeletal:  Positive for joint pain, joint  pain, myalgias, morning stiffness, muscle tenderness and myalgias. Negative for gait problem, joint swelling and muscle weakness.  Skin:  Positive for rash, hair loss and sensitivity to sunlight. Negative for color change.  Allergic/Immunologic: Positive for susceptible to infections.  Neurological:  Positive for headaches. Negative for dizziness.  Hematological:  Positive for swollen glands.  Psychiatric/Behavioral:  Positive for sleep disturbance. Negative for depressed mood. The patient is not nervous/anxious.     PMFS History:  Patient Active Problem List   Diagnosis Date Noted   Pulmonary nodules/lesions, multiple 05/11/2021   Intractable pain    Acute on chronic pancreatitis (HCC) 10/28/2017   DDD (degenerative disc disease), cervical 07/12/2017   DDD (degenerative disc disease), lumbar 07/12/2017   Primary insomnia 05/30/2017   Other fatigue 05/30/2017   ANA positive 04/19/2017   History of chronic sinusitis 04/19/2017   Fibromyalgia 10/24/2016   Osteoarthritis of both knees 10/24/2016   IBS (irritable bowel syndrome) 10/24/2016   Migraines 10/24/2016   Chronic sinusitis 10/24/2016   Pelvic floor dysfunction 10/24/2016   Vulvar vestibulitis 10/24/2016   Dehydration 07/11/2016   Fatty liver 07/11/2016   Elevated LFTs 07/11/2016   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   Abdominal pain 01/05/2016   Pancreatitis, acute    CAP (community acquired pneumonia) 06/19/2014   Pain management 06/18/2014   Chronic pancreatitis (HCC) 06/18/2014   Intractable migraine without aura 01/25/2014   Acute pancreatitis 09/25/2013   Leucocytosis 09/25/2013   Microcytic anemia 09/25/2013   Asthma  09/25/2013   Type 2 diabetes mellitus without complication, with long-term current use of insulin  (HCC) 09/25/2013   Hyponatremia 09/25/2013    Past Medical History:  Diagnosis Date   Asthma    Bursitis    Chronic sinusitis    DDD (degenerative disc disease), cervical    DDD  (degenerative disc disease), lumbar    Diabetes mellitus    Type 1    Fibromyalgia    Hyperlipidemia    IBS (irritable bowel syndrome)    Migraine without aura, with intractable migraine, so stated, without mention of status migrainosus 01/25/2014   Migraines    Osteoarthritis of both knees 10/24/2016   moderate   Pancreatitis chronic    Pelvic floor dysfunction 10/24/2016   Sjogren's syndrome (HCC)    Vulvar vestibulitis     Family History  Problem Relation Age of Onset   Asthma Mother    Pancreatic cancer Mother    High Cholesterol Father    Macular degeneration Father    Migraines Sister    Endometriosis Sister    Asthma Brother    GER disease Brother    Pancreatitis Paternal Grandfather    Pancreatic cancer Other    Pancreatitis Maternal Uncle    Past Surgical History:  Procedure Laterality Date   ABDOMINAL HYSTERECTOMY  05/27/2020   [facility] - per patient   BREAST REDUCTION SURGERY  2010   DILATION AND CURETTAGE OF UTERUS  2007   ERCP     Removed stone from Bile duct   ERCP     with MRI   PANCREAS SURGERY  1997   whiple  1997   whipple's     Social History   Social History Narrative   Not on file   Immunization History  Administered Date(s) Administered   Fluzone Influenza virus vaccine,trivalent (IIV3), split virus 09/11/2010, 11/30/2013, 09/27/2014   Influenza Split 10/08/2018   Influenza,inj,Quad PF,6+ Mos 09/01/2012, 12/06/2017, 09/29/2018   Influenza-Unspecified 11/10/1998, 10/01/2006, 10/11/2008, 09/02/2014   Pneumococcal Polysaccharide-23 12/05/2010   Tdap 11/30/2004     Objective: Vital Signs: BP 121/82 (BP Location: Left Arm, Patient Position: Sitting, Cuff Size: Normal)   Pulse 77   Resp 17   Ht 5\' 4"  (1.626 m)   Wt 170 lb 9.6 oz (77.4 kg)   LMP 04/03/2020   BMI 29.28 kg/m    Physical Exam Vitals and nursing note reviewed.  Constitutional:      Appearance: She is well-developed.  HENT:     Head: Normocephalic and atraumatic.  Eyes:      Conjunctiva/sclera: Conjunctivae normal.  Cardiovascular:     Rate and Rhythm: Normal rate and regular rhythm.     Heart sounds: Normal heart sounds.  Pulmonary:     Effort: Pulmonary effort is normal.     Breath sounds: Normal breath sounds.  Abdominal:     General: Bowel sounds are normal.     Palpations: Abdomen is soft.  Musculoskeletal:     Cervical back: Normal range of motion.  Lymphadenopathy:     Cervical: No cervical adenopathy.  Skin:    General: Skin is warm and dry.     Capillary Refill: Capillary refill takes less than 2 seconds.  Neurological:     Mental Status: She is alert and oriented to person, place, and time.  Psychiatric:        Behavior: Behavior normal.      Musculoskeletal Exam: Generalized hyperalgesia and positive tender points.  C-spine has limited ROM with lateral rotation.  Trapezius muscle tension and tenderness.  Shoulder joints have good ROM with discomfort in the left shoulder.  Elbow joints, wrist joints, MCPs, PIPs, and DIPs good ROM with no synovitis.  Hip joints have good ROM with discomfort in the left hip.  Painful ROM of the left knee-no warmth or effusion.  Right knee has good ROM with no warmth or effusion.  Ankle joints have good ROM with no tenderness or joint swelling.   CDAI Exam: CDAI Score: -- Patient Global: --; Provider Global: -- Swollen: --; Tender: -- Joint Exam 05/12/2024   No joint exam has been documented for this visit   There is currently no information documented on the homunculus. Go to the Rheumatology activity and complete the homunculus joint exam.  Investigation: No additional findings.  Imaging: No results found.  Recent Labs: Lab Results  Component Value Date   WBC 14.6 (H) 04/08/2020   HGB 10.0 (L) 04/08/2020   PLT 513 (H) 04/08/2020   NA 134 (L) 04/08/2020   K 3.2 (L) 04/08/2020   CL 98 04/08/2020   CO2 25 04/08/2020   GLUCOSE 130 (H) 04/08/2020   BUN 13 04/08/2020   CREATININE 0.61 04/08/2020    BILITOT 0.3 04/08/2020   ALKPHOS 74 04/08/2020   AST 17 04/08/2020   ALT 17 04/08/2020   PROT 7.8 04/08/2020   ALBUMIN 3.9 04/08/2020   CALCIUM 8.9 04/08/2020   GFRAA >60 04/08/2020    Speciality Comments: No specialty comments available.  Procedures:  No procedures performed Allergies: Ciprofloxacin hcl, Nortriptyline hcl, Amitriptyline, Compazine [prochlorperazine maleate], Cymbalta [duloxetine hcl], Dexilant [dexlansoprazole], Doxycycline, Flagyl [metronidazole hcl], Gabapentin, Penicillins, Penicillins cross reactors, Septra [sulfamethoxazole-trimethoprim], Tizanidine, Topamax [topiramate], Toradol  [ketorolac  tromethamine ], Xanax [alprazolam], Zofran  [ondansetron  hcl], Amoxicillin, Breo ellipta [fluticasone  furoate-vilanterol], Diflucan [fluconazole], Inapsine  [droperidol ], Ketamine, Levofloxacin , Lyrica [pregabalin], Phenergan  [promethazine  hcl], Phenothiazines, Primaxin  [imipenem ], Prozac [fluoxetine hcl], and Reglan [metoclopramide hcl]   Assessment / Plan:     Visit Diagnoses: Fibromyalgia: She has generalized hyperalgesia and positive tender points on examination.  She has continued to experience intermittent fibromyalgia flares.  Patient states that she has not been as active as she would like to be since she has had recurrent flares of pancreatitis as well.  She has started to walk 30 minutes on a daily basis and is hoping to increase her walking regimen incrementally to reach her goal of an hour per day.  She has been unable to return to formal physical therapy consistently due to the flares of pancreatitis.  Discussed the importance of regular exercise and good sleep hygiene. She will follow up in 3 months or sooner if needed.   Other fatigue: Chronic, stable.   Primary insomnia: Difficulty sleeping at night due to nocturnal pain.   Polyarthralgia - Patient has been experiencing increased myalgias and arthralgias on a daily basis.  Patient states that she feels diffuse  inflammation and would like her inflammatory markers checked today.  Patient plans on trying to reduce her intake of pro inflammatory foods.    No synovitis was noted on examination today.  Plan: Sedimentation rate, C-reactive protein, Rheumatoid factor, Cyclic citrul peptide antibody, IgG  Chronic left shoulder pain - X-rays of the left shoulder were unremarkable.  Left shoulder pain was previously alleviated by physical therapy including dry needling and ultrasound.  She has continued to have persistent discomfort in the left shoulder but has been unable to go to physical therapy consistently recently.  Encourage patient to continue home exercises.  DDD (degenerative disc disease), cervical:  C-spine has limited ROM with lateral rotation.    Pain in thoracic spine: Chronic pain.  X-rays of the thoracic spine 05/08/22: multilevel spondylosis with no radiographic progression. Trapezius muscle tension and tenderness bilaterally.   Spondylosis of lumbar spine: No symptoms of radiculopathy.  Trochanteric bursitis of left hip: Chronic pain. Recommend daily exercises.    Primary osteoarthritis of both knees: Good ROM of both knees with discomfort in the left knee.  No effusion noted. She uses voltaren  gel topically as needed for pain relief.   Sicca complex (HCC) - Negative ANA, positive SSB: Chronic  Other medical conditions are listed as follows:   History of pancreatitis - Hereditary, Upcoming appointment scheduled at [facility].   History of diabetes mellitus  Fatty liver  History of migraine  History of IBS  Pelvic floor dysfunction  Hot flashes   Orders: Orders Placed This Encounter  Procedures   Sedimentation rate   C-reactive protein   Rheumatoid factor   Cyclic citrul peptide antibody, IgG   No orders of the defined types were placed in this encounter.  Follow-Up Instructions: Return in about 3 months (around 08/12/2024) for Fibromyalgia, DDD.   Heather Clubs, PA-C  Note  - This record has been created using Dragon software.  Chart creation errors have been sought, but may not always  have been located. Such creation errors do not reflect on  the standard of medical care.

## 2024-05-12 ENCOUNTER — Ambulatory Visit: Attending: Physician Assistant | Admitting: Physician Assistant

## 2024-05-12 ENCOUNTER — Encounter: Payer: Self-pay | Admitting: Physician Assistant

## 2024-05-12 VITALS — BP 121/82 | HR 77 | Resp 17 | Ht 64.0 in | Wt 170.6 lb

## 2024-05-12 DIAGNOSIS — M5030 Other cervical disc degeneration, unspecified cervical region: Secondary | ICD-10-CM

## 2024-05-12 DIAGNOSIS — G8929 Other chronic pain: Secondary | ICD-10-CM | POA: Diagnosis present

## 2024-05-12 DIAGNOSIS — M797 Fibromyalgia: Secondary | ICD-10-CM | POA: Diagnosis present

## 2024-05-12 DIAGNOSIS — F5101 Primary insomnia: Secondary | ICD-10-CM

## 2024-05-12 DIAGNOSIS — M7062 Trochanteric bursitis, left hip: Secondary | ICD-10-CM

## 2024-05-12 DIAGNOSIS — K760 Fatty (change of) liver, not elsewhere classified: Secondary | ICD-10-CM | POA: Diagnosis present

## 2024-05-12 DIAGNOSIS — M546 Pain in thoracic spine: Secondary | ICD-10-CM

## 2024-05-12 DIAGNOSIS — M170 Bilateral primary osteoarthritis of knee: Secondary | ICD-10-CM | POA: Diagnosis present

## 2024-05-12 DIAGNOSIS — M47816 Spondylosis without myelopathy or radiculopathy, lumbar region: Secondary | ICD-10-CM

## 2024-05-12 DIAGNOSIS — Z8669 Personal history of other diseases of the nervous system and sense organs: Secondary | ICD-10-CM | POA: Diagnosis present

## 2024-05-12 DIAGNOSIS — M25512 Pain in left shoulder: Secondary | ICD-10-CM | POA: Diagnosis present

## 2024-05-12 DIAGNOSIS — M2550 Pain in unspecified joint: Secondary | ICD-10-CM | POA: Diagnosis present

## 2024-05-12 DIAGNOSIS — R5383 Other fatigue: Secondary | ICD-10-CM

## 2024-05-12 DIAGNOSIS — M6289 Other specified disorders of muscle: Secondary | ICD-10-CM

## 2024-05-12 DIAGNOSIS — Z8639 Personal history of other endocrine, nutritional and metabolic disease: Secondary | ICD-10-CM

## 2024-05-12 DIAGNOSIS — R232 Flushing: Secondary | ICD-10-CM

## 2024-05-12 DIAGNOSIS — M3500 Sicca syndrome, unspecified: Secondary | ICD-10-CM

## 2024-05-12 DIAGNOSIS — Z8719 Personal history of other diseases of the digestive system: Secondary | ICD-10-CM

## 2024-05-13 ENCOUNTER — Ambulatory Visit: Payer: Self-pay | Admitting: Physician Assistant

## 2024-05-13 ENCOUNTER — Ambulatory Visit: Admitting: Physician Assistant

## 2024-05-13 NOTE — Progress Notes (Signed)
ESR and CRP WNL  RF negative

## 2024-05-14 LAB — SEDIMENTATION RATE: Sed Rate: 9 mm/h (ref 0–20)

## 2024-05-14 LAB — CYCLIC CITRUL PEPTIDE ANTIBODY, IGG: Cyclic Citrullin Peptide Ab: <16 U

## 2024-05-14 LAB — RHEUMATOID FACTOR: Rheumatoid fact SerPl-aCnc: <10 [IU]/mL (ref <14)

## 2024-05-14 LAB — C-REACTIVE PROTEIN: CRP: 4 mg/L (ref <8.0)

## 2024-05-15 NOTE — Progress Notes (Signed)
 Anti-CCP negative.

## 2024-06-04 DIAGNOSIS — N951 Menopausal and female climacteric states: Secondary | ICD-10-CM

## 2024-06-04 DIAGNOSIS — M6289 Other specified disorders of muscle: Secondary | ICD-10-CM

## 2024-06-04 NOTE — Telephone Encounter (Signed)
 Ok to place referral.

## 2024-06-15 ENCOUNTER — Encounter (HOSPITAL_BASED_OUTPATIENT_CLINIC_OR_DEPARTMENT_OTHER): Admitting: Obstetrics & Gynecology

## 2024-06-29 ENCOUNTER — Other Ambulatory Visit: Payer: Self-pay | Admitting: Emergency Medicine

## 2024-07-01 ENCOUNTER — Encounter (HOSPITAL_BASED_OUTPATIENT_CLINIC_OR_DEPARTMENT_OTHER): Payer: Self-pay | Admitting: Obstetrics & Gynecology

## 2024-07-01 ENCOUNTER — Ambulatory Visit (HOSPITAL_BASED_OUTPATIENT_CLINIC_OR_DEPARTMENT_OTHER): Admitting: Obstetrics & Gynecology

## 2024-07-01 VITALS — BP 124/78 | HR 84 | Wt 169.2 lb

## 2024-07-01 DIAGNOSIS — N951 Menopausal and female climacteric states: Secondary | ICD-10-CM | POA: Diagnosis not present

## 2024-07-01 DIAGNOSIS — L700 Acne vulgaris: Secondary | ICD-10-CM | POA: Diagnosis not present

## 2024-07-01 DIAGNOSIS — N3946 Mixed incontinence: Secondary | ICD-10-CM | POA: Diagnosis not present

## 2024-07-01 NOTE — Progress Notes (Signed)
   New Patient Patient name: Heather Wagner MRN [id number]  Date of birth: Aug 11, 1976 Chief Complaint:   Establish Care (Pelvic floor dysfunction) and perimenopause  History of Present Illness:   Heather Wagner is a 48 y.o. Caucasian female being seen today for new patient appointment due to .  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  Has struggle with this for years.  Underwent whipple procedure (with small portion of pancreas left) in 1997.  Symptoms seem worse now that she is starting to have menopausal - like symptoms.    She had an [facility] done with Dr. Faythe, endocrinology at [facility].  FSH was done 05/21/2024 and it was 27.5.    She does not do mammograms and she has breast thermography.  She's done three or four done.  These were done.  She had a breast reduction.  H/o chronic vulvar itching.  Uses OTC products.  Hasn't had recent vulvar exam.  Declines doing this today because of current pancreatitis   H/o vulvar vestibulitis and has a lot of issues with vulvar skin irritation.  She has had a hx of vaginismus but this is improved.  She does not attempt vaginal penetration at this time.     Patient's last menstrual period was 04/03/2020.      07/01/2024    1:59 PM  Depression screen PHQ 2/9  Decreased Interest 0  Down, Depressed, Hopeless 1  PHQ - 2 Score 1     Review of Systems:   Pertinent items are noted in HPI  Denies any vaginal bleeding or vaginal discharge.   Pertinent History Reviewed:  Reviewed past medical,surgical, social and family history.  Reviewed problem list, medications and allergies. Physical Assessment:   Vitals:   07/01/24 1353 07/01/24 1436  BP: (!) 148/96 124/78  Pulse: 84   SpO2: 98%   Weight: 169 lb 3.2 oz (76.7 kg)   Body mass index is 29.04 kg/m.        Physical Examination:   Physical Exam Constitutional:      Appearance: Normal appearance.  Neurological:     General: No  focal deficit present.     Mental Status: She is alert.  Psychiatric:        Mood and Affect: Mood normal.        Behavior: Behavior normal.    Pt declined any other physical exam today. Assessment & Plan:  1. Menopausal symptoms (Primary) - will check estradiol  level.  If low, suggest considering transdermal estradiol .  Will need progesterone as well but would start with estradiol  for 1 month.  Then could see if pancreatitis symptoms are improved and if so, would need to start progesterone.  Also, she will need a pelvic exam and vulvar exam at follow up if at all possible. - Estradiol   2. Acne vulgaris - will also check testosterone  level - Testosterone , Total, LC/MS/MS  3. Mixed stress and urge urinary incontinence - Ambulatory referral to Physical Therapy   Orders Placed This Encounter  Procedures   Estradiol    Testosterone , Total, LC/MS/MS   Ambulatory referral to Physical Therapy    Meds: No orders of the defined types were placed in this encounter.  Total time with pt: 44 minutes Documentation:  5 minutes Total time:  49 minutes  Follow-up: No follow-ups on file.  Ronal GORMAN Pinal, MD 07/04/2024 10:46 PM

## 2024-07-04 LAB — TESTOSTERONE, TOTAL, LC/MS/MS: Testosterone, total: 20.3 ng/dL

## 2024-07-04 LAB — ESTRADIOL: Estradiol: 70.3 pg/mL

## 2024-07-10 ENCOUNTER — Ambulatory Visit (HOSPITAL_BASED_OUTPATIENT_CLINIC_OR_DEPARTMENT_OTHER): Payer: Self-pay | Admitting: Obstetrics & Gynecology

## 2024-07-15 ENCOUNTER — Encounter (HOSPITAL_BASED_OUTPATIENT_CLINIC_OR_DEPARTMENT_OTHER): Payer: Self-pay | Admitting: Certified Nurse Midwife

## 2024-07-21 ENCOUNTER — Other Ambulatory Visit (HOSPITAL_BASED_OUTPATIENT_CLINIC_OR_DEPARTMENT_OTHER): Payer: Self-pay | Admitting: Obstetrics & Gynecology

## 2024-07-21 MED ORDER — ESTRADIOL 0.025 MG/24HR TD PTTW: 1.0000 | MEDICATED_PATCH | TRANSDERMAL | 2 refills | Status: DC

## 2024-08-04 NOTE — Progress Notes (Deleted)
 Office Visit Note  Patient: Heather Wagner             Date of Birth: [birth year]           MRN: [id number]             PCP: Vernon Velna SAUNDERS, MD Referring: Vernon Velna SAUNDERS, MD Visit Date: 08/18/2024 Occupation: @GUAROCC @  Subjective:  No chief complaint on file.   History of Present Illness: Heather Wagner is a 48 y.o. female ***     Activities of Daily Living:  Patient reports morning stiffness for *** {minute/hour:19697}.   Patient {ACTIONS;DENIES/REPORTS:21021675::Denies} nocturnal pain.  Difficulty dressing/grooming: {ACTIONS;DENIES/REPORTS:21021675::Denies} Difficulty climbing stairs: {ACTIONS;DENIES/REPORTS:21021675::Denies} Difficulty getting out of chair: {ACTIONS;DENIES/REPORTS:21021675::Denies} Difficulty using hands for taps, buttons, cutlery, and/or writing: {ACTIONS;DENIES/REPORTS:21021675::Denies}  No Rheumatology ROS completed.   PMFS History:  Patient Active Problem List   Diagnosis Date Noted   Pulmonary nodules/lesions, multiple 05/11/2021   Intractable pain    Acute on chronic pancreatitis (HCC) 10/28/2017   DDD (degenerative disc disease), cervical 07/12/2017   DDD (degenerative disc disease), lumbar 07/12/2017   Primary insomnia 05/30/2017   Other fatigue 05/30/2017   ANA positive 04/19/2017   History of chronic sinusitis 04/19/2017   Fibromyalgia 10/24/2016   Osteoarthritis of both knees 10/24/2016   IBS (irritable bowel syndrome) 10/24/2016   Migraines 10/24/2016   Chronic sinusitis 10/24/2016   Pelvic floor dysfunction 10/24/2016   Vulvar vestibulitis 10/24/2016   Dehydration 07/11/2016   Fatty liver 07/11/2016   Elevated LFTs 07/11/2016   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   Abdominal pain 01/05/2016   Pancreatitis, acute    CAP (community acquired pneumonia) 06/19/2014   Pain management 06/18/2014   Chronic pancreatitis (HCC) 06/18/2014   Intractable migraine without aura 01/25/2014   Acute  pancreatitis 09/25/2013   Leucocytosis 09/25/2013   Microcytic anemia 09/25/2013   Asthma 09/25/2013   Type 2 diabetes mellitus without complication, with long-term current use of insulin  (HCC) 09/25/2013   Hyponatremia 09/25/2013    Past Medical History:  Diagnosis Date   Asthma    Bursitis    Chronic sinusitis    DDD (degenerative disc disease), cervical    Diabetes mellitus    Type 1    Fibromyalgia    Hyperlipidemia    IBS (irritable bowel syndrome)    Migraine without aura, with intractable migraine, so stated, without mention of status migrainosus 01/25/2014   Osteoarthritis of both knees 10/24/2016   moderate   Pelvic floor dysfunction 10/24/2016   PRSS1-related hereditary pancreatitis, autosomal dominant    Sjogren's syndrome (HCC)    Vulvar vestibulitis     Family History  Problem Relation Age of Onset   Asthma Mother    Pancreatic cancer Mother    High Cholesterol Father    Macular degeneration Father    Migraines Sister    Endometriosis Sister    Asthma Brother    GER disease Brother    Pancreatitis Paternal Grandfather    Pancreatic cancer Other    Pancreatitis Maternal Uncle    Past Surgical History:  Procedure Laterality Date   BREAST REDUCTION SURGERY  2010   DILATION AND CURETTAGE OF UTERUS  2007   x 2, miscarriage wtih retained tissue   ERCP     for stone removal from bile duct   LAPAROSCOPIC HYSTERECTOMY  05/27/2020   TAH with RSO   PANCREATICODUODENECTOMY  1997   80% of pancrease removed   Social History   Social History Narrative   Not on  file   Immunization History  Administered Date(s) Administered   Fluzone Influenza virus vaccine,trivalent (IIV3), split virus 09/11/2010, 11/30/2013, 09/27/2014   Influenza Split 10/08/2018   Influenza,inj,Quad PF,6+ Mos 09/01/2012, 12/06/2017, 09/29/2018   Influenza-Unspecified 11/10/1998, 10/01/2006, 10/11/2008, 09/02/2014   Pneumococcal Polysaccharide-23 12/05/2010   Tdap 11/30/2004      Objective: Vital Signs: LMP 04/03/2020    Physical Exam   Musculoskeletal Exam: ***  CDAI Exam: CDAI Score: -- Patient Global: --; Provider Global: -- Swollen: --; Tender: -- Joint Exam 08/18/2024   No joint exam has been documented for this visit   There is currently no information documented on the homunculus. Go to the Rheumatology activity and complete the homunculus joint exam.  Investigation: No additional findings.  Imaging: No results found.  Recent Labs: Lab Results  Component Value Date   WBC 14.6 (H) 04/08/2020   HGB 10.0 (L) 04/08/2020   PLT 513 (H) 04/08/2020   NA 134 (L) 04/08/2020   K 3.2 (L) 04/08/2020   CL 98 04/08/2020   CO2 25 04/08/2020   GLUCOSE 130 (H) 04/08/2020   BUN 13 04/08/2020   CREATININE 0.61 04/08/2020   BILITOT 0.3 04/08/2020   ALKPHOS 74 04/08/2020   AST 17 04/08/2020   ALT 17 04/08/2020   PROT 7.8 04/08/2020   ALBUMIN 3.9 04/08/2020   CALCIUM 8.9 04/08/2020   GFRAA >60 04/08/2020    Speciality Comments: No specialty comments available.  Procedures:  No procedures performed Allergies: Ciprofloxacin hcl, Nortriptyline hcl, Amitriptyline, Compazine [prochlorperazine maleate], Cymbalta [duloxetine hcl], Dexilant [dexlansoprazole], Doxycycline, Flagyl [metronidazole hcl], Gabapentin, Penicillins, Penicillins cross reactors, Septra [sulfamethoxazole-trimethoprim], Tizanidine, Topamax [topiramate], Toradol  [ketorolac  tromethamine ], Xanax [alprazolam], Zofran  [ondansetron  hcl], Amoxicillin, Breo ellipta [fluticasone  furoate-vilanterol], Diflucan [fluconazole], Inapsine  [droperidol ], Ketamine, Levofloxacin , Lyrica [pregabalin], Phenergan  [promethazine  hcl], Phenothiazines, Primaxin  [imipenem ], Prozac [fluoxetine hcl], and Reglan [metoclopramide hcl]   Assessment / Plan:     Visit Diagnoses: Fibromyalgia  Other fatigue  Primary insomnia  Pelvic floor dysfunction  Chronic left shoulder pain  DDD (degenerative disc disease),  cervical  Pain in thoracic spine  Spondylosis of lumbar spine  Trochanteric bursitis of left hip  Primary osteoarthritis of both knees  Sicca complex (HCC)  Perimenopausal symptoms  History of pancreatitis  History of diabetes mellitus  Fatty liver  History of migraine  History of IBS  Orders: No orders of the defined types were placed in this encounter.  No orders of the defined types were placed in this encounter.   Face-to-face time spent with patient was *** minutes. Greater than 50% of time was spent in counseling and coordination of care.  Follow-Up Instructions: No follow-ups on file.   Waddell CHRISTELLA Craze, PA-C  Note - This record has been created using Dragon software.  Chart creation errors have been sought, but may not always  have been located. Such creation errors do not reflect on  the standard of medical care.

## 2024-08-13 ENCOUNTER — Encounter (HOSPITAL_BASED_OUTPATIENT_CLINIC_OR_DEPARTMENT_OTHER): Admitting: Obstetrics & Gynecology

## 2024-08-14 ENCOUNTER — Other Ambulatory Visit: Payer: Self-pay | Admitting: Emergency Medicine

## 2024-08-18 ENCOUNTER — Ambulatory Visit: Admitting: Physician Assistant

## 2024-08-18 DIAGNOSIS — Z8669 Personal history of other diseases of the nervous system and sense organs: Secondary | ICD-10-CM

## 2024-08-18 DIAGNOSIS — G8929 Other chronic pain: Secondary | ICD-10-CM

## 2024-08-18 DIAGNOSIS — M170 Bilateral primary osteoarthritis of knee: Secondary | ICD-10-CM

## 2024-08-18 DIAGNOSIS — N951 Menopausal and female climacteric states: Secondary | ICD-10-CM

## 2024-08-18 DIAGNOSIS — M546 Pain in thoracic spine: Secondary | ICD-10-CM

## 2024-08-18 DIAGNOSIS — M47816 Spondylosis without myelopathy or radiculopathy, lumbar region: Secondary | ICD-10-CM

## 2024-08-18 DIAGNOSIS — M797 Fibromyalgia: Secondary | ICD-10-CM

## 2024-08-18 DIAGNOSIS — M3500 Sicca syndrome, unspecified: Secondary | ICD-10-CM

## 2024-08-18 DIAGNOSIS — M5030 Other cervical disc degeneration, unspecified cervical region: Secondary | ICD-10-CM

## 2024-08-18 DIAGNOSIS — F5101 Primary insomnia: Secondary | ICD-10-CM

## 2024-08-18 DIAGNOSIS — K760 Fatty (change of) liver, not elsewhere classified: Secondary | ICD-10-CM

## 2024-08-18 DIAGNOSIS — R5383 Other fatigue: Secondary | ICD-10-CM

## 2024-08-18 DIAGNOSIS — Z8719 Personal history of other diseases of the digestive system: Secondary | ICD-10-CM

## 2024-08-18 DIAGNOSIS — M7062 Trochanteric bursitis, left hip: Secondary | ICD-10-CM

## 2024-08-18 DIAGNOSIS — Z8639 Personal history of other endocrine, nutritional and metabolic disease: Secondary | ICD-10-CM

## 2024-08-18 DIAGNOSIS — M6289 Other specified disorders of muscle: Secondary | ICD-10-CM

## 2024-08-19 NOTE — Progress Notes (Deleted)
 Office Visit Note  Patient: Heather Wagner             Date of Birth: 11-02-1976           MRN: [id number]             PCP: Vernon Velna SAUNDERS, MD Referring: Vernon Velna SAUNDERS, MD Visit Date: 09/02/2024 Occupation: @GUAROCC @  Subjective:  No chief complaint on file.   History of Present Illness: Heather Wagner is a 48 y.o. female ***     Activities of Daily Living:  Patient reports morning stiffness for *** {minute/hour:19697}.   Patient {ACTIONS;DENIES/REPORTS:21021675::Denies} nocturnal pain.  Difficulty dressing/grooming: {ACTIONS;DENIES/REPORTS:21021675::Denies} Difficulty climbing stairs: {ACTIONS;DENIES/REPORTS:21021675::Denies} Difficulty getting out of chair: {ACTIONS;DENIES/REPORTS:21021675::Denies} Difficulty using hands for taps, buttons, cutlery, and/or writing: {ACTIONS;DENIES/REPORTS:21021675::Denies}  No Rheumatology ROS completed.   PMFS History:  Patient Active Problem List   Diagnosis Date Noted   Pulmonary nodules/lesions, multiple 05/11/2021   Intractable pain    Acute on chronic pancreatitis (HCC) 10/28/2017   DDD (degenerative disc disease), cervical 07/12/2017   DDD (degenerative disc disease), lumbar 07/12/2017   Primary insomnia 05/30/2017   Other fatigue 05/30/2017   ANA positive 04/19/2017   History of chronic sinusitis 04/19/2017   Fibromyalgia 10/24/2016   Osteoarthritis of both knees 10/24/2016   IBS (irritable bowel syndrome) 10/24/2016   Migraines 10/24/2016   Chronic sinusitis 10/24/2016   Pelvic floor dysfunction 10/24/2016   Vulvar vestibulitis 10/24/2016   Dehydration 07/11/2016   Fatty liver 07/11/2016   Elevated LFTs 07/11/2016   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   Abdominal pain 01/05/2016   Pancreatitis, acute    CAP (community acquired pneumonia) 06/19/2014   Pain management 06/18/2014   Chronic pancreatitis (HCC) 06/18/2014   Intractable migraine without aura 01/25/2014   Acute  pancreatitis 09/25/2013   Leucocytosis 09/25/2013   Microcytic anemia 09/25/2013   Asthma 09/25/2013   Type 2 diabetes mellitus without complication, with long-term current use of insulin  (HCC) 09/25/2013   Hyponatremia 09/25/2013    Past Medical History:  Diagnosis Date   Asthma    Bursitis    Chronic sinusitis    DDD (degenerative disc disease), cervical    Diabetes mellitus    Type 1    Fibromyalgia    Hyperlipidemia    IBS (irritable bowel syndrome)    Migraine without aura, with intractable migraine, so stated, without mention of status migrainosus 01/25/2014   Osteoarthritis of both knees 10/24/2016   moderate   Pelvic floor dysfunction 10/24/2016   PRSS1-related hereditary pancreatitis, autosomal dominant    Sjogren's syndrome (HCC)    Vulvar vestibulitis     Family History  Problem Relation Age of Onset   Asthma Mother    Pancreatic cancer Mother    High Cholesterol Father    Macular degeneration Father    Migraines Sister    Endometriosis Sister    Asthma Brother    GER disease Brother    Pancreatitis Paternal Grandfather    Pancreatic cancer Other    Pancreatitis Maternal Uncle    Past Surgical History:  Procedure Laterality Date   BREAST REDUCTION SURGERY  2010   DILATION AND CURETTAGE OF UTERUS  2007   x 2, miscarriage wtih retained tissue   ERCP     for stone removal from bile duct   LAPAROSCOPIC HYSTERECTOMY  05/27/2020   TAH with RSO   PANCREATICODUODENECTOMY  1997   80% of pancrease removed   Social History   Social History Narrative   Not on  file   Immunization History  Administered Date(s) Administered   Fluzone Influenza virus vaccine,trivalent (IIV3), split virus 09/11/2010, 11/30/2013, 09/27/2014   Influenza Split 10/08/2018   Influenza,inj,Quad PF,6+ Mos 09/01/2012, 12/06/2017, 09/29/2018   Influenza-Unspecified 11/10/1998, 10/01/2006, 10/11/2008, 09/02/2014   Pneumococcal Polysaccharide-23 12/05/2010   Tdap 11/30/2004      Objective: Vital Signs: LMP 04/03/2020    Physical Exam   Musculoskeletal Exam: ***  CDAI Exam: CDAI Score: -- Patient Global: --; Provider Global: -- Swollen: --; Tender: -- Joint Exam 09/02/2024   No joint exam has been documented for this visit   There is currently no information documented on the homunculus. Go to the Rheumatology activity and complete the homunculus joint exam.  Investigation: No additional findings.  Imaging: No results found.  Recent Labs: Lab Results  Component Value Date   WBC 14.6 (H) 04/08/2020   HGB 10.0 (L) 04/08/2020   PLT 513 (H) 04/08/2020   NA 134 (L) 04/08/2020   K 3.2 (L) 04/08/2020   CL 98 04/08/2020   CO2 25 04/08/2020   GLUCOSE 130 (H) 04/08/2020   BUN 13 04/08/2020   CREATININE 0.61 04/08/2020   BILITOT 0.3 04/08/2020   ALKPHOS 74 04/08/2020   AST 17 04/08/2020   ALT 17 04/08/2020   PROT 7.8 04/08/2020   ALBUMIN 3.9 04/08/2020   CALCIUM 8.9 04/08/2020   GFRAA >60 04/08/2020    Speciality Comments: No specialty comments available.  Procedures:  No procedures performed Allergies: Ciprofloxacin hcl, Nortriptyline hcl, Amitriptyline, Compazine [prochlorperazine maleate], Cymbalta [duloxetine hcl], Dexilant [dexlansoprazole], Doxycycline, Flagyl [metronidazole hcl], Gabapentin, Penicillins, Penicillins cross reactors, Septra [sulfamethoxazole-trimethoprim], Tizanidine, Topamax [topiramate], Toradol  [ketorolac  tromethamine ], Xanax [alprazolam], Zofran  [ondansetron  hcl], Amoxicillin, Breo ellipta [fluticasone  furoate-vilanterol], Diflucan [fluconazole], Inapsine  [droperidol ], Ketamine, Levofloxacin , Lyrica [pregabalin], Phenergan  [promethazine  hcl], Phenothiazines, Primaxin  [imipenem ], Prozac [fluoxetine hcl], and Reglan [metoclopramide hcl]   Assessment / Plan:     Visit Diagnoses: Fibromyalgia  Other fatigue  Primary insomnia  Chronic left shoulder pain  DDD (degenerative disc disease), cervical  Pain in thoracic  spine  Spondylosis of lumbar spine  Trochanteric bursitis of left hip  Primary osteoarthritis of both knees  Sicca complex (HCC)  History of pancreatitis  History of diabetes mellitus  Fatty liver  History of migraine  History of IBS  Orders: No orders of the defined types were placed in this encounter.  No orders of the defined types were placed in this encounter.   Face-to-face time spent with patient was *** minutes. Greater than 50% of time was spent in counseling and coordination of care.  Follow-Up Instructions: No follow-ups on file.   Waddell CHRISTELLA Craze, PA-C  Note - This record has been created using Dragon software.  Chart creation errors have been sought, but may not always  have been located. Such creation errors do not reflect on  the standard of medical care.

## 2024-08-27 ENCOUNTER — Encounter (HOSPITAL_BASED_OUTPATIENT_CLINIC_OR_DEPARTMENT_OTHER): Admitting: Obstetrics & Gynecology

## 2024-09-02 ENCOUNTER — Ambulatory Visit: Admitting: Physician Assistant

## 2024-09-02 DIAGNOSIS — F5101 Primary insomnia: Secondary | ICD-10-CM

## 2024-09-02 DIAGNOSIS — Z8669 Personal history of other diseases of the nervous system and sense organs: Secondary | ICD-10-CM

## 2024-09-02 DIAGNOSIS — M7062 Trochanteric bursitis, left hip: Secondary | ICD-10-CM

## 2024-09-02 DIAGNOSIS — Z8639 Personal history of other endocrine, nutritional and metabolic disease: Secondary | ICD-10-CM

## 2024-09-02 DIAGNOSIS — M5030 Other cervical disc degeneration, unspecified cervical region: Secondary | ICD-10-CM

## 2024-09-02 DIAGNOSIS — G8929 Other chronic pain: Secondary | ICD-10-CM

## 2024-09-02 DIAGNOSIS — K760 Fatty (change of) liver, not elsewhere classified: Secondary | ICD-10-CM

## 2024-09-02 DIAGNOSIS — M546 Pain in thoracic spine: Secondary | ICD-10-CM

## 2024-09-02 DIAGNOSIS — R5383 Other fatigue: Secondary | ICD-10-CM

## 2024-09-02 DIAGNOSIS — Z8719 Personal history of other diseases of the digestive system: Secondary | ICD-10-CM

## 2024-09-02 DIAGNOSIS — M3500 Sicca syndrome, unspecified: Secondary | ICD-10-CM

## 2024-09-02 DIAGNOSIS — M797 Fibromyalgia: Secondary | ICD-10-CM

## 2024-09-02 DIAGNOSIS — M47816 Spondylosis without myelopathy or radiculopathy, lumbar region: Secondary | ICD-10-CM

## 2024-09-02 DIAGNOSIS — M170 Bilateral primary osteoarthritis of knee: Secondary | ICD-10-CM

## 2024-09-10 NOTE — Progress Notes (Signed)
 Office Visit Note  Patient: Heather Wagner             Date of Birth: Aug 14, 1976           MRN: [id number]             PCP: Vernon Velna SAUNDERS, MD Referring: Vernon Velna SAUNDERS, MD Visit Date: 09/24/2024 Occupation: @GUAROCC @  Subjective:  Routine follow-up  History of Present Illness: Naeemah Jasmer is a 48 y.o. female with history of fibromyalgia.  Patient was able to go for massage therapy yesterday and is planning to continue massage therapy on a maintenance basis of every 2 weeks which has helped to alleviate her symptoms in the past.  Patient is hoping to also start physical therapy with a new physical therapist soon.  She has benefited from the use of ultrasound in the past.  She continues to have myalgias and muscle tenderness consistent with fibromyalgia.  She has been trying to walk more on a regular basis for exercise.  She has noticed an increased prominence tailor bunion involving the left foot as well as increased callus formation on the plantar aspect of both feet.  At times she has difficulty walking prolonged distances due to the discomfort.  She has not yet been evaluated by podiatry. Patient remains under the care of gastroenterology.  She has a pancreatitis specialist through [facility] as well as has established care with a new GI in [place] due to chronic constipation.  Her GI symptoms have started to be better controlled and seem to be less frequent.    Activities of Daily Living:  Patient reports morning stiffness for 2-3 hours.   Patient Reports nocturnal pain.  Difficulty dressing/grooming: Denies Difficulty climbing stairs: Denies Difficulty getting out of chair: Denies Difficulty using hands for taps, buttons, cutlery, and/or writing: Denies  Review of Systems  Constitutional:  Positive for fatigue.  HENT:  Positive for mouth sores, mouth dryness and nose dryness.   Eyes:  Positive for dryness.  Respiratory:  Negative for shortness of breath.    Cardiovascular:  Negative for chest pain and palpitations.  Gastrointestinal:  Positive for blood in stool and constipation. Negative for diarrhea.  Endocrine: Negative for increased urination.  Genitourinary:  Positive for involuntary urination.  Musculoskeletal:  Positive for joint pain, joint pain, myalgias, morning stiffness, muscle tenderness and myalgias. Negative for gait problem, joint swelling and muscle weakness.  Skin:  Positive for sensitivity to sunlight. Negative for color change, rash and hair loss.  Allergic/Immunologic: Positive for susceptible to infections.  Neurological:  Positive for headaches. Negative for dizziness.  Hematological:  Negative for swollen glands.  Psychiatric/Behavioral:  Positive for sleep disturbance. Negative for depressed mood. The patient is not nervous/anxious.     PMFS History:  Patient Active Problem List   Diagnosis Date Noted   Pulmonary nodules/lesions, multiple 05/11/2021   Intractable pain    Acute on chronic pancreatitis (HCC) 10/28/2017   DDD (degenerative disc disease), cervical 07/12/2017   DDD (degenerative disc disease), lumbar 07/12/2017   Primary insomnia 05/30/2017   Other fatigue 05/30/2017   ANA positive 04/19/2017   History of chronic sinusitis 04/19/2017   Fibromyalgia 10/24/2016   Osteoarthritis of both knees 10/24/2016   IBS (irritable bowel syndrome) 10/24/2016   Migraines 10/24/2016   Chronic sinusitis 10/24/2016   Pelvic floor dysfunction 10/24/2016   Vulvar vestibulitis 10/24/2016   Dehydration 07/11/2016   Fatty liver 07/11/2016   Elevated LFTs 07/11/2016   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  Abdominal pain 01/05/2016   Pancreatitis, acute    CAP (community acquired pneumonia) 06/19/2014   Pain management 06/18/2014   Chronic pancreatitis (HCC) 06/18/2014   Intractable migraine without aura 01/25/2014   Acute pancreatitis 09/25/2013   Leucocytosis 09/25/2013   Microcytic anemia 09/25/2013    Asthma 09/25/2013   Type 2 diabetes mellitus without complication, with long-term current use of insulin  (HCC) 09/25/2013   Hyponatremia 09/25/2013    Past Medical History:  Diagnosis Date   Asthma    Bursitis    Chronic sinusitis    DDD (degenerative disc disease), cervical    Diabetes mellitus    Type 1    Fibromyalgia    Hyperlipidemia    IBS (irritable bowel syndrome)    Migraine without aura, with intractable migraine, so stated, without mention of status migrainosus 01/25/2014   Osteoarthritis of both knees 10/24/2016   moderate   Pelvic floor dysfunction 10/24/2016   PRSS1-related hereditary pancreatitis, autosomal dominant    Sjogren's syndrome    Vulvar vestibulitis     Family History  Problem Relation Age of Onset   Asthma Mother    Pancreatic cancer Mother    High Cholesterol Father    Macular degeneration Father    Migraines Sister    Endometriosis Sister    Asthma Brother    GER disease Brother    Pancreatitis Paternal Grandfather    Pancreatic cancer Other    Pancreatitis Maternal Uncle    Past Surgical History:  Procedure Laterality Date   BREAST REDUCTION SURGERY  2010   DILATION AND CURETTAGE OF UTERUS  2007   x 2, miscarriage wtih retained tissue   ERCP     for stone removal from bile duct   LAPAROSCOPIC HYSTERECTOMY  05/27/2020   TAH with RSO   PANCREATICODUODENECTOMY  1997   80% of pancrease removed   Social History   Social History Narrative   Not on file   Immunization History  Administered Date(s) Administered   Fluzone Influenza virus vaccine,trivalent (IIV3), split virus 09/11/2010, 11/30/2013, 09/27/2014   Influenza Split 10/08/2018   Influenza,inj,Quad PF,6+ Mos 09/01/2012, 12/06/2017, 09/29/2018   Influenza-Unspecified 11/10/1998, 10/01/2006, 10/11/2008, 09/02/2014   Pneumococcal Polysaccharide-23 12/05/2010   Tdap 11/30/2004     Objective: Vital Signs: BP 132/84   Pulse 76   Temp 98.5 F (36.9 C)   Resp 14   Ht 5'  4 (1.626 m)   Wt 168 lb (76.2 kg)   LMP 04/03/2020   BMI 28.84 kg/m    Physical Exam Vitals and nursing note reviewed.  Constitutional:      Appearance: She is well-developed.  HENT:     Head: Normocephalic and atraumatic.  Eyes:     Conjunctiva/sclera: Conjunctivae normal.  Cardiovascular:     Rate and Rhythm: Normal rate and regular rhythm.     Heart sounds: Normal heart sounds.  Pulmonary:     Effort: Pulmonary effort is normal.     Breath sounds: Normal breath sounds.  Abdominal:     General: Bowel sounds are normal.     Palpations: Abdomen is soft.  Musculoskeletal:     Cervical back: Normal range of motion.  Lymphadenopathy:     Cervical: No cervical adenopathy.  Skin:    General: Skin is warm and dry.     Capillary Refill: Capillary refill takes less than 2 seconds.  Neurological:     Mental Status: She is alert and oriented to person, place, and time.  Psychiatric:  Behavior: Behavior normal.      Musculoskeletal Exam: Generalized hyperalgesia and positive tender points on exam.  C-spine has limited range of motion with lateral rotation.  Trapezius muscle tension tenderness bilaterally.  Painful range of motion of both shoulders especially the left shoulder.  Elbow joints have good range of motion with no tenderness or joint swelling.  Wrist joints have good range of motion with no tenderness or synovitis.  No tenderness or synovitis of MCP joints.  Complete fist formation bilaterally.  Hip joints have good range of motion with some discomfort of the left hip.  Knee joints have good range of motion with no warmth or effusion.  Ankle joints have good range of motion with no tenderness or joint swelling.  Tailor bunion, left foot.    CDAI Exam: CDAI Score: -- Patient Global: --; Provider Global: -- Swollen: --; Tender: -- Joint Exam 09/24/2024   No joint exam has been documented for this visit   There is currently no information documented on the  homunculus. Go to the Rheumatology activity and complete the homunculus joint exam.  Investigation: No additional findings.  Imaging: No results found.  Recent Labs: Lab Results  Component Value Date   WBC 14.6 (H) 04/08/2020   HGB 10.0 (L) 04/08/2020   PLT 513 (H) 04/08/2020   NA 134 (L) 04/08/2020   K 3.2 (L) 04/08/2020   CL 98 04/08/2020   CO2 25 04/08/2020   GLUCOSE 130 (H) 04/08/2020   BUN 13 04/08/2020   CREATININE 0.61 04/08/2020   BILITOT 0.3 04/08/2020   ALKPHOS 74 04/08/2020   AST 17 04/08/2020   ALT 17 04/08/2020   PROT 7.8 04/08/2020   ALBUMIN 3.9 04/08/2020   CALCIUM 8.9 04/08/2020   GFRAA >60 04/08/2020    Speciality Comments: No specialty comments available.  Procedures:  No procedures performed Allergies: Ciprofloxacin hcl, Nortriptyline hcl, Amitriptyline, Compazine [prochlorperazine maleate], Cymbalta [duloxetine hcl], Dexilant [dexlansoprazole], Doxycycline, Flagyl [metronidazole hcl], Gabapentin, Penicillins, Penicillins cross reactors, Septra [sulfamethoxazole-trimethoprim], Tizanidine, Topamax [topiramate], Toradol  [ketorolac  tromethamine ], Xanax [alprazolam], Zofran  [ondansetron  hcl], Amoxicillin, Breo ellipta [fluticasone  furoate-vilanterol], Diflucan [fluconazole], Inapsine  [droperidol ], Ketamine, Levofloxacin , Lyrica [pregabalin], Phenergan  [promethazine  hcl], Phenothiazines, Primaxin  [imipenem ], Prozac [fluoxetine hcl], and Reglan [metoclopramide hcl]   Assessment / Plan:     Visit Diagnoses: Fibromyalgia: She has generalized hyperalgesia and positive tender points on exam.  [facility]  She has benefited from the use of ultrasound in the past.  Patient was able to go to massage therapy yesterday for the first time in over a year and had a great treatment performed.  She is planning to continue massage therapy every 2 weeks. Overall she is hoping that her symptoms will be better managed  with the current treatment plan.  She is advised to notify us  if she needs a new order for physical therapy going forward.  Discussed the importance of regular exercise and good sleep hygiene.  She will follow-up in the office in 6 months or sooner if needed.  Other fatigue: Chronic, stable.  Patient is hoping to start walking more on a regular basis for exercise to try to improve her energy level.  Primary insomnia: Discussed the importance of good sleep hygiene.  Chronic left shoulder pain - X-rays of the left shoulder were unremarkable.  Left shoulder pain was previously alleviated by physical therapy including dry needling and ultrasound.  She has good range of motion on examination today with mild soreness.  DDD (  degenerative disc disease), cervical: C-spine has limited range of motion without rotation.  Trapezius muscle tension and tenderness bilaterally.  No symptoms of radiculopathy at this time.  Pain in thoracic spine - X-rays of the thoracic spine 05/08/22: multilevel spondylosis with no radiographic progression. Trapezius muscle tension and tenderness bilaterally.  Patient plans on going for massages every 2 weeks.  Spondylosis of lumbar spine: Intermittent discomfort.  No symptoms of radiculopathy.  Trochanteric bursitis of left hip: Patient plans on continuing physical therapy.  Primary osteoarthritis of both knees: She has good range of motion of both knee joints on examination today.  No warmth or effusion noted.  Tailor's bunion of left foot: She has noticed some progression of the tailor bunion involving the left foot.  She has also had increased callus formation on the plantar aspect of both feet.  Discussed the importance of wearing proper fitting shoes.  Discussed that she may benefit from orthotics or metatarsal pads.  Discussed that she may want to establish care at [facility] for further evaluation and management if her symptoms persist or worsen.  She is hoping to  start walking more on a regular basis so I discussed the importance of wearing shoes with proper support and a wide toebox.  Other medical conditions are listed as follows:  Sicca complex  History of pancreatitis: Under the care of [facility].  History of diabetes mellitus  Fatty liver  History of migraine  History of IBS  Orders: No orders of the defined types were placed in this encounter.  No orders of the defined types were placed in this encounter.    Follow-Up Instructions: Return in about 6 months (around 03/25/2025) for Fibromyalgia.   Waddell CHRISTELLA Craze, PA-C  Note - This record has been created using Dragon software.  Chart creation errors have been sought, but may not always  have been located. Such creation errors do not reflect on  the standard of medical care.

## 2024-09-14 ENCOUNTER — Encounter (HOSPITAL_BASED_OUTPATIENT_CLINIC_OR_DEPARTMENT_OTHER): Admitting: Obstetrics & Gynecology

## 2024-09-24 ENCOUNTER — Encounter: Payer: Self-pay | Admitting: Physician Assistant

## 2024-09-24 ENCOUNTER — Ambulatory Visit: Attending: Physician Assistant | Admitting: Physician Assistant

## 2024-09-24 VITALS — BP 132/84 | HR 76 | Temp 98.5°F | Resp 14 | Ht 64.0 in | Wt 168.0 lb

## 2024-09-24 DIAGNOSIS — M47816 Spondylosis without myelopathy or radiculopathy, lumbar region: Secondary | ICD-10-CM | POA: Insufficient documentation

## 2024-09-24 DIAGNOSIS — M170 Bilateral primary osteoarthritis of knee: Secondary | ICD-10-CM | POA: Insufficient documentation

## 2024-09-24 DIAGNOSIS — M797 Fibromyalgia: Secondary | ICD-10-CM | POA: Insufficient documentation

## 2024-09-24 DIAGNOSIS — Z8669 Personal history of other diseases of the nervous system and sense organs: Secondary | ICD-10-CM | POA: Diagnosis present

## 2024-09-24 DIAGNOSIS — M5030 Other cervical disc degeneration, unspecified cervical region: Secondary | ICD-10-CM | POA: Diagnosis present

## 2024-09-24 DIAGNOSIS — F5101 Primary insomnia: Secondary | ICD-10-CM | POA: Insufficient documentation

## 2024-09-24 DIAGNOSIS — M25512 Pain in left shoulder: Secondary | ICD-10-CM | POA: Insufficient documentation

## 2024-09-24 DIAGNOSIS — Z8639 Personal history of other endocrine, nutritional and metabolic disease: Secondary | ICD-10-CM | POA: Diagnosis present

## 2024-09-24 DIAGNOSIS — M7062 Trochanteric bursitis, left hip: Secondary | ICD-10-CM | POA: Insufficient documentation

## 2024-09-24 DIAGNOSIS — K760 Fatty (change of) liver, not elsewhere classified: Secondary | ICD-10-CM | POA: Diagnosis present

## 2024-09-24 DIAGNOSIS — G8929 Other chronic pain: Secondary | ICD-10-CM | POA: Diagnosis present

## 2024-09-24 DIAGNOSIS — M3500 Sicca syndrome, unspecified: Secondary | ICD-10-CM | POA: Diagnosis present

## 2024-09-24 DIAGNOSIS — M21622 Bunionette of left foot: Secondary | ICD-10-CM | POA: Diagnosis present

## 2024-09-24 DIAGNOSIS — Z8719 Personal history of other diseases of the digestive system: Secondary | ICD-10-CM | POA: Diagnosis present

## 2024-09-24 DIAGNOSIS — R5383 Other fatigue: Secondary | ICD-10-CM | POA: Insufficient documentation

## 2024-09-24 DIAGNOSIS — M546 Pain in thoracic spine: Secondary | ICD-10-CM | POA: Diagnosis present

## 2024-10-01 ENCOUNTER — Encounter (HOSPITAL_BASED_OUTPATIENT_CLINIC_OR_DEPARTMENT_OTHER): Admitting: Obstetrics & Gynecology

## 2024-10-01 NOTE — Progress Notes (Deleted)
   ANNUAL EXAM Patient name: Heather Wagner MRN [id number]  Date of birth: 1976-05-25 Chief Complaint:   No chief complaint on file.  History of Present Illness:   Heather Wagner is a 48 y.o. G33P0010 Caucasian female being seen today for a routine annual exam.  Current complaints: ***  Patient's last menstrual period was 04/03/2020.   The pregnancy intention screening data noted above was reviewed. Potential methods of contraception were discussed. The patient elected to proceed with No data recorded.   Last pap 09/12/2017. Results were: NILM w/ HRHPV negative. H/O abnormal pap: {yes/yes***/no:23866} Last mammogram: She does not do mammograms and she has breast thermography. She's done three or four done. These were done. She had a breast reduction. Family h/o breast cancer: {yes***/no:23838} Last colonoscopy: Family h/o colorectal cancer: {yes***/no:23838}     07/01/2024    1:59 PM  Depression screen PHQ 2/9  Decreased Interest 0  Down, Depressed, Hopeless 1  PHQ - 2 Score 1         No data to display           Review of Systems:   Pertinent items are noted in HPI Denies any headaches, blurred vision, fatigue, shortness of breath, chest pain, abdominal pain, abnormal vaginal discharge/itching/odor/irritation, problems with periods, bowel movements, urination, or intercourse unless otherwise stated above. Pertinent History Reviewed:  Reviewed past medical,surgical, social and family history.  Reviewed problem list, medications and allergies. Physical Assessment:  There were no vitals filed for this visit.There is no height or weight on file to calculate BMI.        Physical Examination:   General appearance - well appearing, and in no distress  Mental status - alert, oriented to person, place, and time  Psych:  She has a normal mood and affect  Skin - warm and dry, normal color, no suspicious lesions noted  Chest - effort normal, all lung fields clear to  auscultation bilaterally  Heart - normal rate and regular rhythm  Neck:  midline trachea, no thyromegaly or nodules  Breasts - breasts appear normal, no suspicious masses, no skin or nipple changes or  axillary nodes  Abdomen - soft, nontender, nondistended, no masses or organomegaly  Pelvic - VULVA: normal appearing vulva with no masses, tenderness or lesions  VAGINA: normal appearing vagina with normal color and discharge, no lesions  CERVIX: normal appearing cervix without discharge or lesions, no CMT  Thin prep pap is {Desc; done/not:10129} *** HR HPV cotesting  UTERUS: uterus is felt to be normal size, shape, consistency and nontender   ADNEXA: No adnexal masses or tenderness noted.  Rectal - normal rectal, good sphincter tone, no masses felt. Hemoccult: ***  Extremities:  No swelling or varicosities noted  Chaperone present for exam  No results found for this or any previous visit (from the past 24 hours).  Assessment & Plan:  1) Well-Woman Exam  2) ***  Labs/procedures today: ***  Mammogram: {Mammo f/u:25212::@ 48yo}, or sooner if problems Colonoscopy: {TCS f/u:25213::@ 48yo}, or sooner if problems  No orders of the defined types were placed in this encounter.   Meds: No orders of the defined types were placed in this encounter.   Follow-up: No follow-ups on file.  Morna LOISE Quale, RN 10/01/2024 8:35 AM

## 2024-10-11 ENCOUNTER — Other Ambulatory Visit (HOSPITAL_BASED_OUTPATIENT_CLINIC_OR_DEPARTMENT_OTHER): Payer: Self-pay | Admitting: Obstetrics & Gynecology

## 2024-10-12 ENCOUNTER — Ambulatory Visit (HOSPITAL_BASED_OUTPATIENT_CLINIC_OR_DEPARTMENT_OTHER): Admitting: Obstetrics & Gynecology

## 2024-10-12 ENCOUNTER — Encounter (HOSPITAL_BASED_OUTPATIENT_CLINIC_OR_DEPARTMENT_OTHER): Payer: Self-pay | Admitting: Obstetrics & Gynecology

## 2024-10-12 ENCOUNTER — Other Ambulatory Visit (HOSPITAL_COMMUNITY)
Admission: RE | Admit: 2024-10-12 | Discharge: 2024-10-12 | Disposition: A | Discharge status: Home / Self Care | Source: Ambulatory Visit | Attending: Obstetrics & Gynecology | Admitting: Obstetrics & Gynecology

## 2024-10-12 VITALS — BP 126/83 | HR 100 | Ht 64.0 in | Wt 168.8 lb

## 2024-10-12 DIAGNOSIS — R102 Pelvic and perineal pain unspecified side: Secondary | ICD-10-CM | POA: Insufficient documentation

## 2024-10-12 DIAGNOSIS — N949 Unspecified condition associated with female genital organs and menstrual cycle: Secondary | ICD-10-CM

## 2024-10-12 DIAGNOSIS — Z01411 Encounter for gynecological examination (general) (routine) with abnormal findings: Secondary | ICD-10-CM | POA: Diagnosis not present

## 2024-10-12 DIAGNOSIS — Z113 Encounter for screening for infections with a predominantly sexual mode of transmission: Secondary | ICD-10-CM | POA: Insufficient documentation

## 2024-10-12 DIAGNOSIS — N941 Unspecified dyspareunia: Secondary | ICD-10-CM | POA: Diagnosis not present

## 2024-10-12 DIAGNOSIS — L900 Lichen sclerosus et atrophicus: Secondary | ICD-10-CM | POA: Diagnosis not present

## 2024-10-12 DIAGNOSIS — N94810 Vulvar vestibulitis: Secondary | ICD-10-CM

## 2024-10-12 DIAGNOSIS — E119 Type 2 diabetes mellitus without complications: Secondary | ICD-10-CM

## 2024-10-12 DIAGNOSIS — Z90710 Acquired absence of both cervix and uterus: Secondary | ICD-10-CM | POA: Diagnosis not present

## 2024-10-12 DIAGNOSIS — Z794 Long term (current) use of insulin: Secondary | ICD-10-CM

## 2024-10-12 DIAGNOSIS — N898 Other specified noninflammatory disorders of vagina: Secondary | ICD-10-CM

## 2024-10-12 LAB — POCT URINALYSIS DIP (CLINITEK)
Bilirubin, UA: NEGATIVE
Glucose, UA: >=1000 mg/dL — AB
Ketones, POC UA: NEGATIVE mg/dL
Leukocytes, UA: NEGATIVE
Nitrite, UA: NEGATIVE
POC PROTEIN,UA: NEGATIVE
Spec Grav, UA: >=1.03 — AB (ref 1.010–1.025)
Urobilinogen, UA: 0.2 U/dL
pH, UA: 5.5 (ref 5.0–8.0)

## 2024-10-12 MED ORDER — CLOBETASOL PROPIONATE 0.05 % EX OINT
1.0000 | TOPICAL_OINTMENT | Freq: Two times a day (BID) | CUTANEOUS | 0 refills | Status: AC
Start: 2024-10-12 — End: ?

## 2024-10-12 NOTE — Progress Notes (Unsigned)
 Breast and Pelvic Exam Patient name: Heather Wagner MRN [id number]  Date of birth: October 14, 1976 Chief Complaint:   Follow-up  History of Present Illness:   Heather Wagner is a 48 y.o. G50P0010 Caucasian female being seen today for follow up and to perform physical exam including breast and pelvic exam which was not done at last visit.  Pt has complicated medical history.  She does have some chronic vulvar irritation.  She has been diagnosed in the past with vulvar vestibulitis.  She's having some itching as well.  Last week she felt she had a yeast infection.  She used OTC clotrimazole and this cleared symptoms about 80%.  Denies vaginal bleeding.    We discussed starting topical estradiol  patch.  We discussed starting very low dosage. She has not started this at this time.  Initially, 0.025mg  patch written.  She would like to start at lower dosage so will cut in 1/2.  Having worsening hot flashes.  She's having these multiple times daily.   We have discussed possible increased risk for pancreatitis with estrogen.  She will monitor symptoms closely.  After more discussed, she decided to use the 0.025mg  dosage and not start lower.    Patient's last menstrual period was 04/03/2020.   Last pap 09/12/2017. Results were: NILM w/ HRHPV negative. H/O abnormal pap: no.  Hysterectomy done 2021 with negative cervical pathology.   Last mammogram: She does not do mammograms and she has breast thermography. She had a breast reduction. Family h/o breast cancer: no Last colonoscopy: Family h/o colorectal cancer: no     07/01/2024    1:59 PM  Depression screen PHQ 2/9  Decreased Interest 0  Down, Depressed, Hopeless 1  PHQ - 2 Score 1    Review of Systems:   Pertinent items are noted in HPI Denies any new urinary or vaginal symptoms.   Pertinent History Reviewed:  Reviewed past medical,surgical, social and family history.  Reviewed problem list, medications and allergies. Physical Assessment:    Vitals:   10/12/24 1523  BP: 126/83  Pulse: 100  SpO2: 100%  Weight: 168 lb 12.8 oz (76.6 kg)  Height: 5' 4 (1.626 m)  Body mass index is 28.97 kg/m.        Physical Examination:   General appearance - well appearing, and in no distress  Mental status - alert, oriented to person, place, and time  Psych:  She has a normal mood and affect  Skin - warm and dry, normal color, no suspicious lesions noted  Chest - effort normal, all lung fields clear to auscultation bilaterally  Heart - normal rate and regular rhythm  Neck:  midline trachea, no thyromegaly or nodules  Breasts - breasts appear normal, no suspicious masses, no skin or nipple changes or  axillary nodes  Abdomen - soft, nontender, nondistended, no masses or organomegaly  Pelvic - VULVA: erythema around introitus  VAGINA: normal appearing vagina with normal color and discharge, thick white change just in midline of perineal body  CERVIX:  surgically absent  Thin prep pap is not  UTERUS: surgically absent  ADNEXA: No adnexal masses or tenderness noted.  Rectal - normal rectal, good sphincter tone, no masses felt.   Extremities:  No swelling or varicosities noted  Chaperone present for exam  Results for orders placed or performed in visit on 10/12/24 (from the past 24 hours)  POCT URINALYSIS DIP (CLINITEK)   Collection Time: 10/12/24  3:36 PM  Result Value Ref Range   Color,  UA yellow yellow   Clarity, UA clear clear   Glucose, UA >=1,000 (A) negative mg/dL   Bilirubin, UA negative negative   Ketones, POC UA negative negative mg/dL   Spec Grav, UA >=8.969 (A) 1.010 - 1.025   Blood, UA trace-intact (A) negative   pH, UA 5.5 5.0 - 8.0   POC PROTEIN,UA negative negative, trace   Urobilinogen, UA 0.2 0.2 or 1.0 E.U./dL   Nitrite, UA Negative Negative   Leukocytes, UA Negative Negative    Assessment & Plan:  1. Encntr for gyn exam (general) (routine) w abnormal findings (Primary) - Pap smear not indicated -  Mammograms have been declined.  She is doing thermography.  Needs to have this updated.  Resources given. - Colonoscopy has attempted without completion - lab work done with PCP, Dr. Vernon - vaccines reviewed/updated  2. Vaginal discomfort - will r/o yeast/BV today - POCT URINALYSIS DIP (CLINITEK) - Cervicovaginal ancillary only( [place]) - pt advised of significant glucosuria.  Reports she hasn't taken last two doses of her insulin .  3. Vulvar vestibulitis - discussed this diagnosis and her current physical exam findings  4. Lichen sclerosus - feel hypopigmentation is likely lichen sclerosus.  Would prefer to obtain a vulvar biopsy but will start with topical clobetasol and then f/u 1 month.  Pt says she thinks she could do this but not today - clobetasol ointment (TEMOVATE) 0.05 %; Apply 1 Application topically 2 (two) times daily. Apply as directed twice daily  Dispense: 60 g; Refill: 0  5. Type 2 diabetes mellitus without complication, with long-term current use of insulin  (HCC) - on insulin   6. Dyspareunia, female   Orders Placed This Encounter  Procedures   POCT URINALYSIS DIP (CLINITEK)    Meds:  Meds ordered this encounter  Medications   clobetasol ointment (TEMOVATE) 0.05 %    Sig: Apply 1 Application topically 2 (two) times daily. Apply as directed twice daily    Dispense:  60 g    Refill:  0    Follow-up: Return in about 6 weeks (around 11/23/2024).  Total time with pt 48 minutes with additional 7 minutes for documentation completion.  Total time:  56 minutes.  Ronal GORMAN Pinal, MD 10/13/2024

## 2024-10-12 NOTE — Telephone Encounter (Signed)
 Patient has an appointment 10/12/2024

## 2024-10-13 LAB — CERVICOVAGINAL ANCILLARY ONLY
Bacterial Vaginitis (gardnerella): NEGATIVE
Candida Glabrata: NEGATIVE
Candida Vaginitis: POSITIVE — AB
Comment: NEGATIVE
Comment: NEGATIVE
Comment: NEGATIVE

## 2024-10-14 ENCOUNTER — Ambulatory Visit (HOSPITAL_BASED_OUTPATIENT_CLINIC_OR_DEPARTMENT_OTHER): Payer: Self-pay | Admitting: Obstetrics & Gynecology

## 2024-10-15 ENCOUNTER — Other Ambulatory Visit (HOSPITAL_BASED_OUTPATIENT_CLINIC_OR_DEPARTMENT_OTHER): Payer: Self-pay

## 2024-10-16 ENCOUNTER — Encounter (HOSPITAL_BASED_OUTPATIENT_CLINIC_OR_DEPARTMENT_OTHER): Payer: Self-pay | Admitting: Obstetrics & Gynecology

## 2024-10-16 ENCOUNTER — Other Ambulatory Visit (HOSPITAL_BASED_OUTPATIENT_CLINIC_OR_DEPARTMENT_OTHER): Payer: Self-pay

## 2024-10-16 MED ORDER — TERCONAZOLE 0.4 % VA CREA: 1.0000 | TOPICAL_CREAM | Freq: Every day | VAGINAL | 0 refills | Status: DC

## 2024-10-17 ENCOUNTER — Other Ambulatory Visit (HOSPITAL_BASED_OUTPATIENT_CLINIC_OR_DEPARTMENT_OTHER): Payer: Self-pay | Admitting: Obstetrics & Gynecology

## 2024-10-19 ENCOUNTER — Encounter (HOSPITAL_BASED_OUTPATIENT_CLINIC_OR_DEPARTMENT_OTHER): Admitting: Obstetrics & Gynecology

## 2024-10-20 ENCOUNTER — Encounter (HOSPITAL_BASED_OUTPATIENT_CLINIC_OR_DEPARTMENT_OTHER): Payer: Self-pay | Admitting: Obstetrics & Gynecology

## 2024-10-27 ENCOUNTER — Other Ambulatory Visit (HOSPITAL_BASED_OUTPATIENT_CLINIC_OR_DEPARTMENT_OTHER): Payer: Self-pay

## 2024-10-27 DIAGNOSIS — N949 Unspecified condition associated with female genital organs and menstrual cycle: Secondary | ICD-10-CM

## 2024-10-27 MED ORDER — TERCONAZOLE 0.4 % VA CREA: 1.0000 | TOPICAL_CREAM | Freq: Every day | VAGINAL | 0 refills | Status: DC

## 2024-11-05 ENCOUNTER — Ambulatory Visit: Admitting: Emergency Medicine

## 2024-11-19 NOTE — Progress Notes (Unsigned)
° °  GYNECOLOGY  VISIT  CC:   No chief complaint on file.   HPI: 48 y.o. G53P0010 Married White or Caucasian female here for follow up.  Patient's last menstrual period was 04/03/2020.  Past Medical History:  Diagnosis Date   Asthma    Bursitis    Chronic sinusitis    DDD (degenerative disc disease), cervical    Diabetes mellitus    Type 1    Fibromyalgia    Hyperlipidemia    IBS (irritable bowel syndrome)    Migraine without aura, with intractable migraine, so stated, without mention of status migrainosus 01/25/2014   Osteoarthritis of both knees 10/24/2016   moderate   Pelvic floor dysfunction 10/24/2016   PRSS1-related hereditary pancreatitis, autosomal dominant    Sjogren's syndrome    Vulvar vestibulitis     MEDS:  Reviewed in EPIC  ALLERGIES: Ciprofloxacin hcl, Nortriptyline hcl, Amitriptyline, Compazine [prochlorperazine maleate], Cymbalta [duloxetine hcl], Dexilant [dexlansoprazole], Doxycycline, Flagyl [metronidazole hcl], Gabapentin, Penicillins, Penicillins cross reactors, Septra [sulfamethoxazole-trimethoprim], Tizanidine, Topamax [topiramate], Toradol  [ketorolac  tromethamine ], Xanax [alprazolam], Zofran  [ondansetron  hcl], Amoxicillin, Breo ellipta [fluticasone  furoate-vilanterol], Diflucan [fluconazole], Inapsine  [droperidol ], Ketamine, Levofloxacin , Lyrica [pregabalin], Phenergan  [promethazine  hcl], Phenothiazines, Primaxin  [imipenem ], Prozac [fluoxetine hcl], and Reglan [metoclopramide hcl]  SH:  ***  ROS  PHYSICAL EXAMINATION:    LMP 04/03/2020     General appearance: alert, cooperative and appears stated age Neck: no adenopathy, supple, symmetrical, trachea midline and thyroid {CHL AMB PHY EX THYROID NORM [phone]::normal to inspection and palpation} CV:  {Exam; heart brief:31539} Lungs:  {pe lungs ob:314451} Breasts: {Exam; breast:13139::normal appearance, no masses or tenderness} Abdomen: soft, non-tender; bowel sounds normal; no masses,   no organomegaly Lymph:  no inguinal LAD noted  Pelvic: External genitalia:  no lesions              Urethra:  normal appearing urethra with no masses, tenderness or lesions              Bartholins and Skenes: normal                 Vagina: {exam; pelvic vaginal:30846}              Cervix: {CHL AMB PHY EX CERVIX NORM DEFAULT:[phone]::no lesions}              Bimanual Exam:  Uterus:  {CHL AMB PHY EX UTERUS NORM [phone]::normal size, contour, position, consistency, mobility, non-tender}              Adnexa: {CHL AMB PHY EX ADNEXA NO MASS DEFAULT:[phone]::no mass, fullness, tenderness}              Rectovaginal: {yes no:[id number]}.  Confirms.              Anus:  normal sphincter tone, no lesions  Chaperone was present for exam.  Assessment/Plan: There are no diagnoses linked to this encounter.

## 2024-11-20 ENCOUNTER — Other Ambulatory Visit (HOSPITAL_COMMUNITY)
Admission: RE | Admit: 2024-11-20 | Discharge: 2024-11-20 | Disposition: A | Discharge status: Home / Self Care | Source: Ambulatory Visit | Attending: Obstetrics & Gynecology | Admitting: Obstetrics & Gynecology

## 2024-11-20 ENCOUNTER — Other Ambulatory Visit (HOSPITAL_BASED_OUTPATIENT_CLINIC_OR_DEPARTMENT_OTHER): Payer: Self-pay

## 2024-11-20 ENCOUNTER — Ambulatory Visit (INDEPENDENT_AMBULATORY_CARE_PROVIDER_SITE_OTHER): Admitting: Obstetrics & Gynecology

## 2024-11-20 ENCOUNTER — Encounter (HOSPITAL_BASED_OUTPATIENT_CLINIC_OR_DEPARTMENT_OTHER): Payer: Self-pay | Admitting: Obstetrics & Gynecology

## 2024-11-20 VITALS — BP 115/65 | HR 67 | Ht 64.0 in | Wt 170.0 lb

## 2024-11-20 DIAGNOSIS — Z90710 Acquired absence of both cervix and uterus: Secondary | ICD-10-CM

## 2024-11-20 DIAGNOSIS — L900 Lichen sclerosus et atrophicus: Secondary | ICD-10-CM | POA: Diagnosis not present

## 2024-11-20 DIAGNOSIS — B3731 Acute candidiasis of vulva and vagina: Secondary | ICD-10-CM

## 2024-11-20 DIAGNOSIS — Z79890 Hormone replacement therapy (postmenopausal): Secondary | ICD-10-CM

## 2024-11-20 MED ORDER — CLOBETASOL PROPIONATE 0.05 % EX OINT
1.0000 | TOPICAL_OINTMENT | Freq: Two times a day (BID) | CUTANEOUS | 0 refills | Status: AC
  Filled 2024-11-20: qty 15, 30d supply, fill #0

## 2024-11-20 MED ORDER — ESTRADIOL 0.0375 MG/24HR TD PTTW: 1.0000 | MEDICATED_PATCH | TRANSDERMAL | 1 refills | Status: AC

## 2024-11-23 LAB — CERVICOVAGINAL ANCILLARY ONLY
Bacterial Vaginitis (gardnerella): NEGATIVE
Candida Glabrata: NEGATIVE
Candida Vaginitis: POSITIVE — AB
Comment: NEGATIVE
Comment: NEGATIVE
Comment: NEGATIVE

## 2024-11-24 ENCOUNTER — Other Ambulatory Visit (HOSPITAL_BASED_OUTPATIENT_CLINIC_OR_DEPARTMENT_OTHER): Payer: Self-pay

## 2024-11-24 ENCOUNTER — Ambulatory Visit (HOSPITAL_BASED_OUTPATIENT_CLINIC_OR_DEPARTMENT_OTHER): Payer: Self-pay | Admitting: Obstetrics & Gynecology

## 2024-11-24 DIAGNOSIS — N949 Unspecified condition associated with female genital organs and menstrual cycle: Secondary | ICD-10-CM

## 2024-11-24 MED ORDER — TERCONAZOLE 0.4 % VA CREA: 1.0000 | TOPICAL_CREAM | Freq: Every day | VAGINAL | 0 refills | Status: AC

## 2024-12-18 ENCOUNTER — Encounter (HOSPITAL_BASED_OUTPATIENT_CLINIC_OR_DEPARTMENT_OTHER): Payer: Self-pay | Admitting: Obstetrics & Gynecology

## 2025-01-01 ENCOUNTER — Ambulatory Visit (HOSPITAL_BASED_OUTPATIENT_CLINIC_OR_DEPARTMENT_OTHER): Payer: Self-pay | Admitting: Obstetrics & Gynecology

## 2025-01-06 ENCOUNTER — Ambulatory Visit: Admitting: Emergency Medicine

## 2025-01-28 ENCOUNTER — Ambulatory Visit (HOSPITAL_BASED_OUTPATIENT_CLINIC_OR_DEPARTMENT_OTHER): Admitting: Obstetrics & Gynecology

## 2025-02-05 ENCOUNTER — Ambulatory Visit: Admitting: Emergency Medicine

## 2025-03-16 ENCOUNTER — Ambulatory Visit: Admitting: Rheumatology

## 2025-03-24 ENCOUNTER — Ambulatory Visit: Admitting: Rheumatology
